# Patient Record
Sex: Male | Born: 1955
Health system: Southern US, Community
[De-identification: ages and names within clinical notes are randomized; demographics above are authoritative.]

## PROBLEM LIST (undated history)

## (undated) DIAGNOSIS — I739 Peripheral vascular disease, unspecified: Secondary | ICD-10-CM

## (undated) DIAGNOSIS — E785 Hyperlipidemia, unspecified: Secondary | ICD-10-CM

## (undated) DIAGNOSIS — M2550 Pain in unspecified joint: Secondary | ICD-10-CM

## (undated) DIAGNOSIS — I2510 Atherosclerotic heart disease of native coronary artery without angina pectoris: Secondary | ICD-10-CM

## (undated) DIAGNOSIS — R002 Palpitations: Secondary | ICD-10-CM

## (undated) DIAGNOSIS — I701 Atherosclerosis of renal artery: Secondary | ICD-10-CM

## (undated) DIAGNOSIS — E739 Lactose intolerance, unspecified: Secondary | ICD-10-CM

## (undated) DIAGNOSIS — F4323 Adjustment disorder with mixed anxiety and depressed mood: Secondary | ICD-10-CM

## (undated) DIAGNOSIS — I779 Disorder of arteries and arterioles, unspecified: Secondary | ICD-10-CM

## (undated) DIAGNOSIS — I252 Old myocardial infarction: Secondary | ICD-10-CM

## (undated) DIAGNOSIS — I10 Essential (primary) hypertension: Secondary | ICD-10-CM

## (undated) DIAGNOSIS — G4730 Sleep apnea, unspecified: Secondary | ICD-10-CM

## (undated) DIAGNOSIS — I499 Cardiac arrhythmia, unspecified: Secondary | ICD-10-CM

## (undated) DIAGNOSIS — R0602 Shortness of breath: Secondary | ICD-10-CM

## (undated) DIAGNOSIS — M549 Dorsalgia, unspecified: Secondary | ICD-10-CM

## (undated) DIAGNOSIS — R079 Chest pain, unspecified: Secondary | ICD-10-CM

## (undated) DIAGNOSIS — F419 Anxiety disorder, unspecified: Secondary | ICD-10-CM

## (undated) DIAGNOSIS — I209 Angina pectoris, unspecified: Secondary | ICD-10-CM

## (undated) DIAGNOSIS — M25551 Pain in right hip: Secondary | ICD-10-CM

## (undated) HISTORY — DX: Essential (primary) hypertension: I10

## (undated) HISTORY — DX: Sleep apnea, unspecified: G47.30

## (undated) HISTORY — DX: Shortness of breath: R06.02

## (undated) HISTORY — DX: Peripheral vascular disease, unspecified: I73.9

## (undated) HISTORY — DX: Disorder of arteries and arterioles, unspecified: I77.9

## (undated) HISTORY — DX: Pain in right hip: M25.551

## (undated) HISTORY — PX: ANTERIOR CRUCIATE LIGAMENT REPAIR: SHX115

## (undated) HISTORY — DX: Lactose intolerance, unspecified: E73.9

## (undated) HISTORY — DX: Dorsalgia, unspecified: M54.9

## (undated) HISTORY — DX: Old myocardial infarction: I25.2

## (undated) HISTORY — DX: Palpitations: R00.2

## (undated) HISTORY — DX: Atherosclerosis of renal artery: I70.1

## (undated) HISTORY — DX: Pain in unspecified joint: M25.50

## (undated) HISTORY — PX: HIP ARTHROSCOPY W/ LABRAL REPAIR: SHX1750

## (undated) HISTORY — DX: Chest pain, unspecified: R07.9

---

## 1997-12-18 ENCOUNTER — Inpatient Hospital Stay (HOSPITAL_COMMUNITY): Admission: EM | Admit: 1997-12-18 | Discharge: 1997-12-24 | Payer: Self-pay | Admitting: Emergency Medicine

## 1998-01-12 ENCOUNTER — Encounter (HOSPITAL_COMMUNITY): Admission: RE | Admit: 1998-01-12 | Discharge: 1998-04-12 | Payer: Self-pay | Admitting: Cardiovascular Disease

## 1998-01-21 ENCOUNTER — Ambulatory Visit (HOSPITAL_COMMUNITY): Admission: RE | Admit: 1998-01-21 | Discharge: 1998-01-22 | Payer: Self-pay | Admitting: Cardiovascular Disease

## 1998-04-26 ENCOUNTER — Observation Stay (HOSPITAL_COMMUNITY): Admission: RE | Admit: 1998-04-26 | Discharge: 1998-04-27 | Payer: Self-pay | Admitting: Cardiovascular Disease

## 1998-06-16 ENCOUNTER — Observation Stay (HOSPITAL_COMMUNITY): Admission: RE | Admit: 1998-06-16 | Discharge: 1998-06-17 | Payer: Self-pay | Admitting: Cardiovascular Disease

## 1998-10-02 ENCOUNTER — Inpatient Hospital Stay (HOSPITAL_COMMUNITY): Admission: EM | Admit: 1998-10-02 | Discharge: 1998-10-05 | Payer: Self-pay | Admitting: *Deleted

## 1998-10-02 ENCOUNTER — Encounter: Payer: Self-pay | Admitting: *Deleted

## 1999-07-06 ENCOUNTER — Ambulatory Visit (HOSPITAL_COMMUNITY): Admission: RE | Admit: 1999-07-06 | Discharge: 1999-07-07 | Payer: Self-pay | Admitting: Cardiovascular Disease

## 1999-07-06 ENCOUNTER — Encounter: Payer: Self-pay | Admitting: Cardiovascular Disease

## 1999-08-01 ENCOUNTER — Observation Stay (HOSPITAL_COMMUNITY): Admission: RE | Admit: 1999-08-01 | Discharge: 1999-08-02 | Payer: Self-pay | Admitting: Cardiovascular Disease

## 2000-02-17 ENCOUNTER — Inpatient Hospital Stay (HOSPITAL_COMMUNITY): Admission: AD | Admit: 2000-02-17 | Discharge: 2000-02-22 | Payer: Self-pay | Admitting: Cardiovascular Disease

## 2000-02-18 ENCOUNTER — Encounter: Payer: Self-pay | Admitting: Cardiovascular Disease

## 2000-02-21 HISTORY — PX: CORONARY ANGIOPLASTY: SHX604

## 2000-07-09 ENCOUNTER — Encounter: Payer: Self-pay | Admitting: Family Medicine

## 2000-07-09 ENCOUNTER — Encounter: Admission: RE | Admit: 2000-07-09 | Discharge: 2000-07-09 | Payer: Self-pay | Admitting: Family Medicine

## 2001-02-11 ENCOUNTER — Emergency Department (HOSPITAL_COMMUNITY): Admission: EM | Admit: 2001-02-11 | Discharge: 2001-02-11 | Payer: Self-pay | Admitting: Emergency Medicine

## 2001-02-11 ENCOUNTER — Encounter: Payer: Self-pay | Admitting: Emergency Medicine

## 2004-03-25 ENCOUNTER — Emergency Department (HOSPITAL_COMMUNITY): Admission: EM | Admit: 2004-03-25 | Discharge: 2004-03-26 | Payer: Self-pay | Admitting: Emergency Medicine

## 2004-09-08 ENCOUNTER — Encounter: Admission: RE | Admit: 2004-09-08 | Discharge: 2004-09-08 | Payer: Self-pay | Admitting: Family Medicine

## 2005-01-13 ENCOUNTER — Ambulatory Visit (HOSPITAL_COMMUNITY): Admission: RE | Admit: 2005-01-13 | Discharge: 2005-01-13 | Payer: Self-pay | Admitting: Otolaryngology

## 2005-01-26 ENCOUNTER — Encounter (INDEPENDENT_AMBULATORY_CARE_PROVIDER_SITE_OTHER): Payer: Self-pay | Admitting: Specialist

## 2005-01-26 ENCOUNTER — Ambulatory Visit (HOSPITAL_COMMUNITY): Admission: RE | Admit: 2005-01-26 | Discharge: 2005-01-27 | Payer: Self-pay | Admitting: Otolaryngology

## 2005-02-02 ENCOUNTER — Inpatient Hospital Stay (HOSPITAL_COMMUNITY): Admission: EM | Admit: 2005-02-02 | Discharge: 2005-02-06 | Payer: Self-pay | Admitting: Emergency Medicine

## 2005-11-09 HISTORY — PX: OTHER SURGICAL HISTORY: SHX169

## 2006-05-03 ENCOUNTER — Inpatient Hospital Stay (HOSPITAL_COMMUNITY): Admission: RE | Admit: 2006-05-03 | Discharge: 2006-05-04 | Payer: Self-pay | Admitting: Vascular Surgery

## 2006-05-03 ENCOUNTER — Encounter (INDEPENDENT_AMBULATORY_CARE_PROVIDER_SITE_OTHER): Payer: Self-pay | Admitting: *Deleted

## 2006-05-03 HISTORY — PX: CAROTID ENDARTERECTOMY: SUR193

## 2006-11-19 ENCOUNTER — Ambulatory Visit: Payer: Self-pay | Admitting: Vascular Surgery

## 2009-12-10 DIAGNOSIS — I2510 Atherosclerotic heart disease of native coronary artery without angina pectoris: Secondary | ICD-10-CM

## 2009-12-10 HISTORY — DX: Atherosclerotic heart disease of native coronary artery without angina pectoris: I25.10

## 2009-12-14 ENCOUNTER — Inpatient Hospital Stay (HOSPITAL_COMMUNITY): Admission: AD | Admit: 2009-12-14 | Discharge: 2009-12-27 | Payer: Self-pay | Admitting: Cardiovascular Disease

## 2009-12-14 ENCOUNTER — Ambulatory Visit: Payer: Self-pay | Admitting: Thoracic Surgery (Cardiothoracic Vascular Surgery)

## 2009-12-14 HISTORY — PX: CARDIAC CATHETERIZATION: SHX172

## 2009-12-15 ENCOUNTER — Encounter (INDEPENDENT_AMBULATORY_CARE_PROVIDER_SITE_OTHER): Payer: Self-pay | Admitting: Cardiovascular Disease

## 2009-12-20 HISTORY — PX: CORONARY ARTERY BYPASS GRAFT: SHX141

## 2009-12-30 ENCOUNTER — Ambulatory Visit: Payer: Self-pay | Admitting: Thoracic Surgery (Cardiothoracic Vascular Surgery)

## 2010-01-13 ENCOUNTER — Encounter
Admission: RE | Admit: 2010-01-13 | Discharge: 2010-01-13 | Payer: Self-pay | Admitting: Thoracic Surgery (Cardiothoracic Vascular Surgery)

## 2010-01-13 ENCOUNTER — Ambulatory Visit: Payer: Self-pay | Admitting: Thoracic Surgery (Cardiothoracic Vascular Surgery)

## 2010-01-20 ENCOUNTER — Encounter (HOSPITAL_COMMUNITY): Admission: RE | Admit: 2010-01-20 | Discharge: 2010-04-20 | Payer: Self-pay | Admitting: Cardiovascular Disease

## 2010-01-20 ENCOUNTER — Ambulatory Visit: Payer: Self-pay | Admitting: Thoracic Surgery (Cardiothoracic Vascular Surgery)

## 2010-04-21 ENCOUNTER — Encounter (HOSPITAL_COMMUNITY): Admission: RE | Admit: 2010-04-21 | Discharge: 2010-05-20 | Payer: Self-pay | Admitting: Cardiovascular Disease

## 2010-09-17 ENCOUNTER — Emergency Department (HOSPITAL_COMMUNITY)
Admission: EM | Admit: 2010-09-17 | Discharge: 2010-09-17 | Payer: Self-pay | Source: Home / Self Care | Admitting: Emergency Medicine

## 2010-09-26 LAB — COMPREHENSIVE METABOLIC PANEL
ALT: 36 U/L (ref 0–53)
AST: 32 U/L (ref 0–37)
Albumin: 4.2 g/dL (ref 3.5–5.2)
Alkaline Phosphatase: 61 U/L (ref 39–117)
BUN: 17 mg/dL (ref 6–23)
CO2: 23 mEq/L (ref 19–32)
Calcium: 9.4 mg/dL (ref 8.4–10.5)
Chloride: 106 mEq/L (ref 96–112)
Creatinine, Ser: 1.15 mg/dL (ref 0.4–1.5)
GFR calc Af Amer: >60 mL/min (ref >60)
GFR calc non Af Amer: >60 mL/min (ref >60)
Glucose, Bld: 146 mg/dL — ABNORMAL HIGH (ref 70–99)
Potassium: 4.2 mEq/L (ref 3.5–5.1)
Sodium: 141 mEq/L (ref 135–145)
Total Bilirubin: 1.1 mg/dL (ref 0.3–1.2)
Total Protein: 7.5 g/dL (ref 6.0–8.3)

## 2010-09-26 LAB — CBC
HCT: 48 % (ref 39.0–52.0)
Hemoglobin: 16.8 g/dL (ref 13.0–17.0)
MCH: 32.7 pg (ref 26.0–34.0)
MCHC: 35 g/dL (ref 30.0–36.0)
MCV: 93.4 fL (ref 78.0–100.0)
Platelets: 195 10*3/uL (ref 150–400)
RBC: 5.14 MIL/uL (ref 4.22–5.81)
RDW: 12.6 % (ref 11.5–15.5)
WBC: 14.5 10*3/uL — ABNORMAL HIGH (ref 4.0–10.5)

## 2010-09-26 LAB — DIFFERENTIAL
Basophils Absolute: 0 10*3/uL (ref 0.0–0.1)
Basophils Relative: 0 % (ref 0–1)
Eosinophils Absolute: 0 10*3/uL (ref 0.0–0.7)
Eosinophils Relative: 0 % (ref 0–5)
Lymphocytes Relative: 2 % — ABNORMAL LOW (ref 12–46)
Lymphs Abs: 0.3 10*3/uL — ABNORMAL LOW (ref 0.7–4.0)
Monocytes Absolute: 0.7 10*3/uL (ref 0.1–1.0)
Monocytes Relative: 5 % (ref 3–12)
Neutro Abs: 13.5 10*3/uL — ABNORMAL HIGH (ref 1.7–7.7)
Neutrophils Relative %: 93 % — ABNORMAL HIGH (ref 43–77)

## 2010-09-26 LAB — POCT CARDIAC MARKERS
CKMB, poc: <1 ng/mL — ABNORMAL LOW (ref 1.0–8.0)
Myoglobin, poc: 46.2 ng/mL (ref 12–200)
Troponin i, poc: <0.05 ng/mL (ref 0.00–0.09)

## 2010-09-26 LAB — LIPASE, BLOOD: Lipase: 16 U/L (ref 11–59)

## 2010-10-02 ENCOUNTER — Encounter: Payer: Self-pay | Admitting: Otolaryngology

## 2010-11-29 LAB — BASIC METABOLIC PANEL
BUN: 12 mg/dL (ref 6–23)
BUN: 13 mg/dL (ref 6–23)
BUN: 9 mg/dL (ref 6–23)
CO2: 29 mEq/L (ref 19–32)
CO2: 32 mEq/L (ref 19–32)
CO2: 32 mEq/L (ref 19–32)
Calcium: 8.1 mg/dL — ABNORMAL LOW (ref 8.4–10.5)
Calcium: 8.3 mg/dL — ABNORMAL LOW (ref 8.4–10.5)
Calcium: 8.4 mg/dL (ref 8.4–10.5)
Chloride: 101 mEq/L (ref 96–112)
Chloride: 96 mEq/L (ref 96–112)
Chloride: 97 mEq/L (ref 96–112)
Creatinine, Ser: 0.96 mg/dL (ref 0.4–1.5)
Creatinine, Ser: 0.99 mg/dL (ref 0.4–1.5)
Creatinine, Ser: 1 mg/dL (ref 0.4–1.5)
GFR calc Af Amer: >60 mL/min (ref >60)
GFR calc Af Amer: >60 mL/min (ref >60)
GFR calc Af Amer: >60 mL/min (ref >60)
GFR calc non Af Amer: >60 mL/min (ref >60)
GFR calc non Af Amer: >60 mL/min (ref >60)
GFR calc non Af Amer: >60 mL/min (ref >60)
Glucose, Bld: 104 mg/dL — ABNORMAL HIGH (ref 70–99)
Glucose, Bld: 119 mg/dL — ABNORMAL HIGH (ref 70–99)
Glucose, Bld: 94 mg/dL (ref 70–99)
Potassium: 3.6 mEq/L (ref 3.5–5.1)
Potassium: 3.6 mEq/L (ref 3.5–5.1)
Potassium: 3.9 mEq/L (ref 3.5–5.1)
Sodium: 136 mEq/L (ref 135–145)
Sodium: 138 mEq/L (ref 135–145)
Sodium: 140 mEq/L (ref 135–145)

## 2010-11-29 LAB — CBC
HCT: 28.5 % — ABNORMAL LOW (ref 39.0–52.0)
HCT: 30.4 % — ABNORMAL LOW (ref 39.0–52.0)
Hemoglobin: 10.1 g/dL — ABNORMAL LOW (ref 13.0–17.0)
Hemoglobin: 10.5 g/dL — ABNORMAL LOW (ref 13.0–17.0)
MCHC: 34.6 g/dL (ref 30.0–36.0)
MCHC: 35.4 g/dL (ref 30.0–36.0)
MCV: 96 fL (ref 78.0–100.0)
MCV: 96.2 fL (ref 78.0–100.0)
Platelets: 169 10*3/uL (ref 150–400)
Platelets: 207 10*3/uL (ref 150–400)
RBC: 2.96 MIL/uL — ABNORMAL LOW (ref 4.22–5.81)
RBC: 3.16 MIL/uL — ABNORMAL LOW (ref 4.22–5.81)
RDW: 13.4 % (ref 11.5–15.5)
RDW: 13.6 % (ref 11.5–15.5)
WBC: 11.1 10*3/uL — ABNORMAL HIGH (ref 4.0–10.5)
WBC: 13.3 10*3/uL — ABNORMAL HIGH (ref 4.0–10.5)

## 2010-11-29 LAB — GLUCOSE, CAPILLARY
Glucose-Capillary: 102 mg/dL — ABNORMAL HIGH (ref 70–99)
Glucose-Capillary: 111 mg/dL — ABNORMAL HIGH (ref 70–99)
Glucose-Capillary: 115 mg/dL — ABNORMAL HIGH (ref 70–99)
Glucose-Capillary: 117 mg/dL — ABNORMAL HIGH (ref 70–99)
Glucose-Capillary: 117 mg/dL — ABNORMAL HIGH (ref 70–99)
Glucose-Capillary: 125 mg/dL — ABNORMAL HIGH (ref 70–99)
Glucose-Capillary: 125 mg/dL — ABNORMAL HIGH (ref 70–99)
Glucose-Capillary: 132 mg/dL — ABNORMAL HIGH (ref 70–99)
Glucose-Capillary: 132 mg/dL — ABNORMAL HIGH (ref 70–99)
Glucose-Capillary: 136 mg/dL — ABNORMAL HIGH (ref 70–99)
Glucose-Capillary: 137 mg/dL — ABNORMAL HIGH (ref 70–99)
Glucose-Capillary: 145 mg/dL — ABNORMAL HIGH (ref 70–99)
Glucose-Capillary: 86 mg/dL (ref 70–99)

## 2010-11-30 LAB — CARDIAC PANEL(CRET KIN+CKTOT+MB+TROPI)
CK, MB: 0.7 ng/mL (ref 0.3–4.0)
CK, MB: 1.6 ng/mL (ref 0.3–4.0)
Relative Index: 0.7 (ref 0.0–2.5)
Relative Index: INVALID (ref 0.0–2.5)
Total CK: 234 U/L — ABNORMAL HIGH (ref 7–232)
Total CK: 70 U/L (ref 7–232)
Troponin I: <0.01 ng/mL (ref 0.00–0.06)
Troponin I: <0.01 ng/mL (ref 0.00–0.06)

## 2010-11-30 LAB — BASIC METABOLIC PANEL
BUN: 10 mg/dL (ref 6–23)
BUN: 10 mg/dL (ref 6–23)
BUN: 12 mg/dL (ref 6–23)
BUN: 13 mg/dL (ref 6–23)
BUN: 14 mg/dL (ref 6–23)
BUN: 9 mg/dL (ref 6–23)
CO2: 24 mEq/L (ref 19–32)
CO2: 26 mEq/L (ref 19–32)
CO2: 27 mEq/L (ref 19–32)
CO2: 30 mEq/L (ref 19–32)
CO2: 30 mEq/L (ref 19–32)
CO2: 33 mEq/L — ABNORMAL HIGH (ref 19–32)
Calcium: 7.5 mg/dL — ABNORMAL LOW (ref 8.4–10.5)
Calcium: 7.8 mg/dL — ABNORMAL LOW (ref 8.4–10.5)
Calcium: 8.2 mg/dL — ABNORMAL LOW (ref 8.4–10.5)
Calcium: 8.4 mg/dL (ref 8.4–10.5)
Calcium: 9 mg/dL (ref 8.4–10.5)
Calcium: 9.5 mg/dL (ref 8.4–10.5)
Chloride: 101 mEq/L (ref 96–112)
Chloride: 104 mEq/L (ref 96–112)
Chloride: 106 mEq/L (ref 96–112)
Chloride: 108 mEq/L (ref 96–112)
Chloride: 96 mEq/L (ref 96–112)
Chloride: 98 mEq/L (ref 96–112)
Creatinine, Ser: 0.84 mg/dL (ref 0.4–1.5)
Creatinine, Ser: 0.95 mg/dL (ref 0.4–1.5)
Creatinine, Ser: 0.95 mg/dL (ref 0.4–1.5)
Creatinine, Ser: 0.99 mg/dL (ref 0.4–1.5)
Creatinine, Ser: 1.01 mg/dL (ref 0.4–1.5)
Creatinine, Ser: 1.09 mg/dL (ref 0.4–1.5)
GFR calc Af Amer: >60 mL/min (ref >60)
GFR calc Af Amer: >60 mL/min (ref >60)
GFR calc Af Amer: >60 mL/min (ref >60)
GFR calc Af Amer: >60 mL/min (ref >60)
GFR calc Af Amer: >60 mL/min (ref >60)
GFR calc Af Amer: >60 mL/min (ref >60)
GFR calc non Af Amer: >60 mL/min (ref >60)
GFR calc non Af Amer: >60 mL/min (ref >60)
GFR calc non Af Amer: >60 mL/min (ref >60)
GFR calc non Af Amer: >60 mL/min (ref >60)
GFR calc non Af Amer: >60 mL/min (ref >60)
GFR calc non Af Amer: >60 mL/min (ref >60)
Glucose, Bld: 100 mg/dL — ABNORMAL HIGH (ref 70–99)
Glucose, Bld: 107 mg/dL — ABNORMAL HIGH (ref 70–99)
Glucose, Bld: 109 mg/dL — ABNORMAL HIGH (ref 70–99)
Glucose, Bld: 145 mg/dL — ABNORMAL HIGH (ref 70–99)
Glucose, Bld: 168 mg/dL — ABNORMAL HIGH (ref 70–99)
Glucose, Bld: 99 mg/dL (ref 70–99)
Potassium: 3.9 mEq/L (ref 3.5–5.1)
Potassium: 3.9 mEq/L (ref 3.5–5.1)
Potassium: 4.3 mEq/L (ref 3.5–5.1)
Potassium: 4.5 mEq/L (ref 3.5–5.1)
Potassium: 4.5 mEq/L (ref 3.5–5.1)
Potassium: 4.5 mEq/L (ref 3.5–5.1)
Sodium: 134 mEq/L — ABNORMAL LOW (ref 135–145)
Sodium: 135 mEq/L (ref 135–145)
Sodium: 135 mEq/L (ref 135–145)
Sodium: 138 mEq/L (ref 135–145)
Sodium: 139 mEq/L (ref 135–145)
Sodium: 139 mEq/L (ref 135–145)

## 2010-11-30 LAB — CBC
HCT: 28 % — ABNORMAL LOW (ref 39.0–52.0)
HCT: 29 % — ABNORMAL LOW (ref 39.0–52.0)
HCT: 31.1 % — ABNORMAL LOW (ref 39.0–52.0)
HCT: 32 % — ABNORMAL LOW (ref 39.0–52.0)
HCT: 32.5 % — ABNORMAL LOW (ref 39.0–52.0)
HCT: 32.8 % — ABNORMAL LOW (ref 39.0–52.0)
HCT: 38 % — ABNORMAL LOW (ref 39.0–52.0)
HCT: 38.4 % — ABNORMAL LOW (ref 39.0–52.0)
HCT: 38.8 % — ABNORMAL LOW (ref 39.0–52.0)
HCT: 40.7 % (ref 39.0–52.0)
HCT: 42.3 % (ref 39.0–52.0)
HCT: 44 % (ref 39.0–52.0)
Hemoglobin: 10.1 g/dL — ABNORMAL LOW (ref 13.0–17.0)
Hemoglobin: 10.6 g/dL — ABNORMAL LOW (ref 13.0–17.0)
Hemoglobin: 10.9 g/dL — ABNORMAL LOW (ref 13.0–17.0)
Hemoglobin: 11.1 g/dL — ABNORMAL LOW (ref 13.0–17.0)
Hemoglobin: 11.3 g/dL — ABNORMAL LOW (ref 13.0–17.0)
Hemoglobin: 13 g/dL (ref 13.0–17.0)
Hemoglobin: 13.1 g/dL (ref 13.0–17.0)
Hemoglobin: 13.2 g/dL (ref 13.0–17.0)
Hemoglobin: 13.9 g/dL (ref 13.0–17.0)
Hemoglobin: 14.4 g/dL (ref 13.0–17.0)
Hemoglobin: 15 g/dL (ref 13.0–17.0)
Hemoglobin: 9.7 g/dL — ABNORMAL LOW (ref 13.0–17.0)
MCHC: 33.5 g/dL (ref 30.0–36.0)
MCHC: 33.6 g/dL (ref 30.0–36.0)
MCHC: 34 g/dL (ref 30.0–36.0)
MCHC: 34.1 g/dL (ref 30.0–36.0)
MCHC: 34.1 g/dL (ref 30.0–36.0)
MCHC: 34.2 g/dL (ref 30.0–36.0)
MCHC: 34.4 g/dL (ref 30.0–36.0)
MCHC: 34.4 g/dL (ref 30.0–36.0)
MCHC: 34.5 g/dL (ref 30.0–36.0)
MCHC: 34.6 g/dL (ref 30.0–36.0)
MCHC: 34.6 g/dL (ref 30.0–36.0)
MCHC: 34.8 g/dL (ref 30.0–36.0)
MCV: 94.3 fL (ref 78.0–100.0)
MCV: 94.5 fL (ref 78.0–100.0)
MCV: 94.9 fL (ref 78.0–100.0)
MCV: 95 fL (ref 78.0–100.0)
MCV: 95.3 fL (ref 78.0–100.0)
MCV: 95.5 fL (ref 78.0–100.0)
MCV: 95.6 fL (ref 78.0–100.0)
MCV: 95.6 fL (ref 78.0–100.0)
MCV: 95.6 fL (ref 78.0–100.0)
MCV: 95.7 fL (ref 78.0–100.0)
MCV: 95.9 fL (ref 78.0–100.0)
MCV: 96.3 fL (ref 78.0–100.0)
Platelets: 104 10*3/uL — ABNORMAL LOW (ref 150–400)
Platelets: 120 10*3/uL — ABNORMAL LOW (ref 150–400)
Platelets: 122 10*3/uL — ABNORMAL LOW (ref 150–400)
Platelets: 124 10*3/uL — ABNORMAL LOW (ref 150–400)
Platelets: 137 10*3/uL — ABNORMAL LOW (ref 150–400)
Platelets: 147 10*3/uL — ABNORMAL LOW (ref 150–400)
Platelets: 164 10*3/uL (ref 150–400)
Platelets: 166 10*3/uL (ref 150–400)
Platelets: 170 10*3/uL (ref 150–400)
Platelets: 183 10*3/uL (ref 150–400)
Platelets: 190 10*3/uL (ref 150–400)
Platelets: 197 10*3/uL (ref 150–400)
RBC: 2.91 MIL/uL — ABNORMAL LOW (ref 4.22–5.81)
RBC: 3.03 MIL/uL — ABNORMAL LOW (ref 4.22–5.81)
RBC: 3.24 MIL/uL — ABNORMAL LOW (ref 4.22–5.81)
RBC: 3.37 MIL/uL — ABNORMAL LOW (ref 4.22–5.81)
RBC: 3.4 MIL/uL — ABNORMAL LOW (ref 4.22–5.81)
RBC: 3.44 MIL/uL — ABNORMAL LOW (ref 4.22–5.81)
RBC: 3.97 MIL/uL — ABNORMAL LOW (ref 4.22–5.81)
RBC: 4.03 MIL/uL — ABNORMAL LOW (ref 4.22–5.81)
RBC: 4.05 MIL/uL — ABNORMAL LOW (ref 4.22–5.81)
RBC: 4.31 MIL/uL (ref 4.22–5.81)
RBC: 4.46 MIL/uL (ref 4.22–5.81)
RBC: 4.66 MIL/uL (ref 4.22–5.81)
RDW: 12.9 % (ref 11.5–15.5)
RDW: 13 % (ref 11.5–15.5)
RDW: 13 % (ref 11.5–15.5)
RDW: 13.1 % (ref 11.5–15.5)
RDW: 13.1 % (ref 11.5–15.5)
RDW: 13.2 % (ref 11.5–15.5)
RDW: 13.2 % (ref 11.5–15.5)
RDW: 13.2 % (ref 11.5–15.5)
RDW: 13.3 % (ref 11.5–15.5)
RDW: 13.5 % (ref 11.5–15.5)
RDW: 13.5 % (ref 11.5–15.5)
RDW: 13.8 % (ref 11.5–15.5)
WBC: 12.1 10*3/uL — ABNORMAL HIGH (ref 4.0–10.5)
WBC: 13.8 10*3/uL — ABNORMAL HIGH (ref 4.0–10.5)
WBC: 16.2 10*3/uL — ABNORMAL HIGH (ref 4.0–10.5)
WBC: 19.1 10*3/uL — ABNORMAL HIGH (ref 4.0–10.5)
WBC: 19.5 10*3/uL — ABNORMAL HIGH (ref 4.0–10.5)
WBC: 20.2 10*3/uL — ABNORMAL HIGH (ref 4.0–10.5)
WBC: 5.3 10*3/uL (ref 4.0–10.5)
WBC: 6.1 10*3/uL (ref 4.0–10.5)
WBC: 6.4 10*3/uL (ref 4.0–10.5)
WBC: 6.9 10*3/uL (ref 4.0–10.5)
WBC: 6.9 10*3/uL (ref 4.0–10.5)
WBC: 7.5 10*3/uL (ref 4.0–10.5)

## 2010-11-30 LAB — MAGNESIUM
Magnesium: 2.3 mg/dL (ref 1.5–2.5)
Magnesium: 2.4 mg/dL (ref 1.5–2.5)
Magnesium: 2.8 mg/dL — ABNORMAL HIGH (ref 1.5–2.5)

## 2010-11-30 LAB — GLUCOSE, CAPILLARY
Glucose-Capillary: 109 mg/dL — ABNORMAL HIGH (ref 70–99)
Glucose-Capillary: 117 mg/dL — ABNORMAL HIGH (ref 70–99)
Glucose-Capillary: 119 mg/dL — ABNORMAL HIGH (ref 70–99)
Glucose-Capillary: 123 mg/dL — ABNORMAL HIGH (ref 70–99)
Glucose-Capillary: 126 mg/dL — ABNORMAL HIGH (ref 70–99)
Glucose-Capillary: 133 mg/dL — ABNORMAL HIGH (ref 70–99)
Glucose-Capillary: 135 mg/dL — ABNORMAL HIGH (ref 70–99)
Glucose-Capillary: 140 mg/dL — ABNORMAL HIGH (ref 70–99)
Glucose-Capillary: 140 mg/dL — ABNORMAL HIGH (ref 70–99)
Glucose-Capillary: 144 mg/dL — ABNORMAL HIGH (ref 70–99)
Glucose-Capillary: 146 mg/dL — ABNORMAL HIGH (ref 70–99)
Glucose-Capillary: 146 mg/dL — ABNORMAL HIGH (ref 70–99)
Glucose-Capillary: 151 mg/dL — ABNORMAL HIGH (ref 70–99)
Glucose-Capillary: 152 mg/dL — ABNORMAL HIGH (ref 70–99)
Glucose-Capillary: 154 mg/dL — ABNORMAL HIGH (ref 70–99)
Glucose-Capillary: 154 mg/dL — ABNORMAL HIGH (ref 70–99)
Glucose-Capillary: 157 mg/dL — ABNORMAL HIGH (ref 70–99)
Glucose-Capillary: 160 mg/dL — ABNORMAL HIGH (ref 70–99)
Glucose-Capillary: 163 mg/dL — ABNORMAL HIGH (ref 70–99)
Glucose-Capillary: 168 mg/dL — ABNORMAL HIGH (ref 70–99)
Glucose-Capillary: 168 mg/dL — ABNORMAL HIGH (ref 70–99)
Glucose-Capillary: 183 mg/dL — ABNORMAL HIGH (ref 70–99)
Glucose-Capillary: 188 mg/dL — ABNORMAL HIGH (ref 70–99)
Glucose-Capillary: 91 mg/dL (ref 70–99)
Glucose-Capillary: 93 mg/dL (ref 70–99)

## 2010-11-30 LAB — BLOOD GAS, ARTERIAL
Acid-Base Excess: 1.4 mmol/L (ref 0.0–2.0)
Bicarbonate: 26 mEq/L — ABNORMAL HIGH (ref 20.0–24.0)
FIO2: 0.21 %
O2 Saturation: 95.1 %
Patient temperature: 98.6
TCO2: 27.4 mmol/L (ref 0–100)
pCO2 arterial: 45.1 mmHg — ABNORMAL HIGH (ref 35.0–45.0)
pH, Arterial: 7.379 (ref 7.350–7.450)
pO2, Arterial: 76.6 mmHg — ABNORMAL LOW (ref 80.0–100.0)

## 2010-11-30 LAB — TROPONIN I: Troponin I: <0.01 ng/mL (ref 0.00–0.06)

## 2010-11-30 LAB — HEPARIN LEVEL (UNFRACTIONATED)
Heparin Unfractionated: 0.19 IU/mL — ABNORMAL LOW (ref 0.30–0.70)
Heparin Unfractionated: 0.27 IU/mL — ABNORMAL LOW (ref 0.30–0.70)
Heparin Unfractionated: 0.31 IU/mL (ref 0.30–0.70)
Heparin Unfractionated: 0.36 IU/mL (ref 0.30–0.70)
Heparin Unfractionated: 0.4 IU/mL (ref 0.30–0.70)
Heparin Unfractionated: 0.51 IU/mL (ref 0.30–0.70)
Heparin Unfractionated: 0.62 IU/mL (ref 0.30–0.70)

## 2010-11-30 LAB — APTT
aPTT: 32 seconds (ref 24–37)
aPTT: 34 seconds (ref 24–37)

## 2010-11-30 LAB — URINALYSIS, ROUTINE W REFLEX MICROSCOPIC
Bilirubin Urine: NEGATIVE
Glucose, UA: NEGATIVE mg/dL
Hgb urine dipstick: NEGATIVE
Ketones, ur: NEGATIVE mg/dL
Nitrite: NEGATIVE
Protein, ur: NEGATIVE mg/dL
Specific Gravity, Urine: 1.012 (ref 1.005–1.030)
Urobilinogen, UA: 0.2 mg/dL (ref 0.0–1.0)
pH: 6.5 (ref 5.0–8.0)

## 2010-11-30 LAB — CREATININE, SERUM
Creatinine, Ser: 0.98 mg/dL (ref 0.4–1.5)
Creatinine, Ser: 1.18 mg/dL (ref 0.4–1.5)
GFR calc Af Amer: >60 mL/min (ref >60)
GFR calc Af Amer: >60 mL/min (ref >60)
GFR calc non Af Amer: >60 mL/min (ref >60)
GFR calc non Af Amer: >60 mL/min (ref >60)

## 2010-11-30 LAB — CROSSMATCH
ABO/RH(D): O POS
Antibody Screen: NEGATIVE

## 2010-11-30 LAB — POCT I-STAT 3, ART BLOOD GAS (G3+)
Acid-Base Excess: 1 mmol/L (ref 0.0–2.0)
Acid-base deficit: 1 mmol/L (ref 0.0–2.0)
Acid-base deficit: 4 mmol/L — ABNORMAL HIGH (ref 0.0–2.0)
Bicarbonate: 21.8 mEq/L (ref 20.0–24.0)
Bicarbonate: 23.6 mEq/L (ref 20.0–24.0)
Bicarbonate: 25.3 mEq/L — ABNORMAL HIGH (ref 20.0–24.0)
O2 Saturation: 100 %
O2 Saturation: 96 %
O2 Saturation: 98 %
Patient temperature: 35.9
Patient temperature: 98.8
TCO2: 23 mmol/L (ref 0–100)
TCO2: 25 mmol/L (ref 0–100)
TCO2: 26 mmol/L (ref 0–100)
pCO2 arterial: 37 mmHg (ref 35.0–45.0)
pCO2 arterial: 38.6 mmHg (ref 35.0–45.0)
pCO2 arterial: 43.9 mmHg (ref 35.0–45.0)
pH, Arterial: 7.305 — ABNORMAL LOW (ref 7.350–7.450)
pH, Arterial: 7.409 (ref 7.350–7.450)
pH, Arterial: 7.425 (ref 7.350–7.450)
pO2, Arterial: 107 mmHg — ABNORMAL HIGH (ref 80.0–100.0)
pO2, Arterial: 371 mmHg — ABNORMAL HIGH (ref 80.0–100.0)
pO2, Arterial: 88 mmHg (ref 80.0–100.0)

## 2010-11-30 LAB — POCT I-STAT, CHEM 8
BUN: 10 mg/dL (ref 6–23)
BUN: 14 mg/dL (ref 6–23)
BUN: 9 mg/dL (ref 6–23)
Calcium, Ion: 1.05 mmol/L — ABNORMAL LOW (ref 1.12–1.32)
Calcium, Ion: 1.05 mmol/L — ABNORMAL LOW (ref 1.12–1.32)
Calcium, Ion: 1.15 mmol/L (ref 1.12–1.32)
Chloride: 101 mEq/L (ref 96–112)
Chloride: 104 mEq/L (ref 96–112)
Chloride: 104 mEq/L (ref 96–112)
Creatinine, Ser: 0.8 mg/dL (ref 0.4–1.5)
Creatinine, Ser: 0.9 mg/dL (ref 0.4–1.5)
Creatinine, Ser: 0.9 mg/dL (ref 0.4–1.5)
Glucose, Bld: 168 mg/dL — ABNORMAL HIGH (ref 70–99)
Glucose, Bld: 168 mg/dL — ABNORMAL HIGH (ref 70–99)
Glucose, Bld: 171 mg/dL — ABNORMAL HIGH (ref 70–99)
HCT: 34 % — ABNORMAL LOW (ref 39.0–52.0)
HCT: 34 % — ABNORMAL LOW (ref 39.0–52.0)
HCT: 35 % — ABNORMAL LOW (ref 39.0–52.0)
Hemoglobin: 11.6 g/dL — ABNORMAL LOW (ref 13.0–17.0)
Hemoglobin: 11.6 g/dL — ABNORMAL LOW (ref 13.0–17.0)
Hemoglobin: 11.9 g/dL — ABNORMAL LOW (ref 13.0–17.0)
Potassium: 4.9 mEq/L (ref 3.5–5.1)
Potassium: 6.3 mEq/L (ref 3.5–5.1)
Potassium: 6.3 mEq/L (ref 3.5–5.1)
Sodium: 137 mEq/L (ref 135–145)
Sodium: 138 mEq/L (ref 135–145)
Sodium: 138 mEq/L (ref 135–145)
TCO2: 25 mmol/L (ref 0–100)
TCO2: 25 mmol/L (ref 0–100)
TCO2: 25 mmol/L (ref 0–100)

## 2010-11-30 LAB — POCT I-STAT 4, (NA,K, GLUC, HGB,HCT)
Glucose, Bld: 107 mg/dL — ABNORMAL HIGH (ref 70–99)
Glucose, Bld: 109 mg/dL — ABNORMAL HIGH (ref 70–99)
Glucose, Bld: 115 mg/dL — ABNORMAL HIGH (ref 70–99)
Glucose, Bld: 129 mg/dL — ABNORMAL HIGH (ref 70–99)
Glucose, Bld: 138 mg/dL — ABNORMAL HIGH (ref 70–99)
Glucose, Bld: 93 mg/dL (ref 70–99)
HCT: 26 % — ABNORMAL LOW (ref 39.0–52.0)
HCT: 26 % — ABNORMAL LOW (ref 39.0–52.0)
HCT: 31 % — ABNORMAL LOW (ref 39.0–52.0)
HCT: 33 % — ABNORMAL LOW (ref 39.0–52.0)
HCT: 38 % — ABNORMAL LOW (ref 39.0–52.0)
HCT: 38 % — ABNORMAL LOW (ref 39.0–52.0)
Hemoglobin: 10.5 g/dL — ABNORMAL LOW (ref 13.0–17.0)
Hemoglobin: 11.2 g/dL — ABNORMAL LOW (ref 13.0–17.0)
Hemoglobin: 12.9 g/dL — ABNORMAL LOW (ref 13.0–17.0)
Hemoglobin: 12.9 g/dL — ABNORMAL LOW (ref 13.0–17.0)
Hemoglobin: 8.8 g/dL — ABNORMAL LOW (ref 13.0–17.0)
Hemoglobin: 8.8 g/dL — ABNORMAL LOW (ref 13.0–17.0)
Potassium: 3.7 mEq/L (ref 3.5–5.1)
Potassium: 3.8 mEq/L (ref 3.5–5.1)
Potassium: 3.9 mEq/L (ref 3.5–5.1)
Potassium: 4 mEq/L (ref 3.5–5.1)
Potassium: 4 mEq/L (ref 3.5–5.1)
Potassium: 4.7 mEq/L (ref 3.5–5.1)
Sodium: 132 mEq/L — ABNORMAL LOW (ref 135–145)
Sodium: 135 mEq/L (ref 135–145)
Sodium: 137 mEq/L (ref 135–145)
Sodium: 138 mEq/L (ref 135–145)
Sodium: 139 mEq/L (ref 135–145)
Sodium: 139 mEq/L (ref 135–145)

## 2010-11-30 LAB — COMPREHENSIVE METABOLIC PANEL
ALT: 32 U/L (ref 0–53)
AST: 25 U/L (ref 0–37)
Albumin: 3.6 g/dL (ref 3.5–5.2)
Alkaline Phosphatase: 54 U/L (ref 39–117)
BUN: 7 mg/dL (ref 6–23)
CO2: 29 mEq/L (ref 19–32)
Calcium: 8.6 mg/dL (ref 8.4–10.5)
Chloride: 103 mEq/L (ref 96–112)
Creatinine, Ser: 0.98 mg/dL (ref 0.4–1.5)
GFR calc Af Amer: >60 mL/min (ref >60)
GFR calc non Af Amer: >60 mL/min (ref >60)
Glucose, Bld: 108 mg/dL — ABNORMAL HIGH (ref 70–99)
Potassium: 4.1 mEq/L (ref 3.5–5.1)
Sodium: 138 mEq/L (ref 135–145)
Total Bilirubin: 0.4 mg/dL (ref 0.3–1.2)
Total Protein: 6.2 g/dL (ref 6.0–8.3)

## 2010-11-30 LAB — PROTIME-INR
INR: 0.99 (ref 0.00–1.49)
INR: 1.31 (ref 0.00–1.49)
Prothrombin Time: 13 seconds (ref 11.6–15.2)
Prothrombin Time: 16.2 seconds — ABNORMAL HIGH (ref 11.6–15.2)

## 2010-11-30 LAB — POTASSIUM
Potassium: 4.5 mEq/L (ref 3.5–5.1)
Potassium: 6 mEq/L — ABNORMAL HIGH (ref 3.5–5.1)

## 2010-11-30 LAB — CK TOTAL AND CKMB (NOT AT ARMC)
CK, MB: 1 ng/mL (ref 0.3–4.0)
Relative Index: 0.7 (ref 0.0–2.5)
Total CK: 152 U/L (ref 7–232)

## 2010-11-30 LAB — HEMOGLOBIN AND HEMATOCRIT, BLOOD
HCT: 28.8 % — ABNORMAL LOW (ref 39.0–52.0)
Hemoglobin: 9.8 g/dL — ABNORMAL LOW (ref 13.0–17.0)

## 2010-11-30 LAB — MRSA PCR SCREENING: MRSA by PCR: NEGATIVE

## 2010-11-30 LAB — POCT I-STAT GLUCOSE
Glucose, Bld: 116 mg/dL — ABNORMAL HIGH (ref 70–99)
Glucose, Bld: 117 mg/dL — ABNORMAL HIGH (ref 70–99)
Operator id: 219281
Operator id: 219281

## 2010-11-30 LAB — AMYLASE: Amylase: 45 U/L (ref 0–105)

## 2010-11-30 LAB — PLATELET COUNT: Platelets: 120 10*3/uL — ABNORMAL LOW (ref 150–400)

## 2010-11-30 LAB — TSH: TSH: 1.049 u[IU]/mL (ref 0.350–4.500)

## 2011-01-24 NOTE — Assessment & Plan Note (Signed)
OFFICE VISIT   Maurice Park, HECKENDORN  DOB:  1956-05-09                                        Jan 13, 2010  CHART #:  [id number]   HISTORY:  The patient is a 55 year old gentleman who underwent coronary  bypass grafting x4 on December 20, 2009.  He had mammary to his LAD, a  radial artery to his first diagonal, and then sequential vein graft to  his posterior descending and posterolateral.  There was no graftable  circumflex target.  Postoperatively, he did well without any significant  complications.  He did have some atrial fibrillation, which was treated  with amiodarone.  He saw [name] yesterday.  His amiodarone was  decreased to once a day, Imdur was decreased from 30 to 15 mg daily due  to headaches and his metoprolol was changed to 50 mg once daily.  The  patient states he has been having some pain in his right chest.  He  really feels like this is more muscular in the pectoralis.  He complains  of numbness in the lateral aspect of his left forearm and numbness in  the fifth finger on his left hand.  He has not had any anginal-type  symptoms.  He was having headaches that improved last night with a  decrease of his Imdur 15 mg daily.   CURRENT MEDICATIONS:  1. Amiodarone 200 mg daily.  2. Imdur 15 mg daily.  3. Metoprolol 50 mg daily.  4. Aspirin 325 mg daily.  5. Altace 5 mg daily.  6. Vytorin 10/20 one tablet daily.  7. Ambien 5 mg nightly p.r.n.   PHYSICAL EXAMINATION:  The patient is a well-appearing 55 year old white  male, in no acute distress.  His blood pressure is 103/64, pulse 56,  respirations are 16, and oxygen saturation is 97% on room air.  His  lungs are clear to auscultation and percussion.  Cardiac exam has  regular rate and rhythm.  Normal S1 and S2.  No rubs, murmurs, or  gallops.  His sternal incision is clean, dry, and intact.  Sternum is  stable without clicking or popping.  His left radial incision has 2  areas of slight skin  separation.  There is some visible suture material,  which was removed.  There is some eschar.  There is sort of inflammatory  changes at the skin edges.  There is no signs of infection.  His leg  wound is healing well.   DIAGNOSTIC TESTS:  Chest x-ray shows good aeration of the lungs  bilaterally.   IMPRESSION:  The patient is doing extremely well now.  He is about 3  weeks out from coronary bypass grafting.  He is still very early in the  recovery process.  He is starting to see an improvement in his appetite.  He still is having some difficulty sleeping, but feels better when he  takes pain medication prior to going to sleep.  He has been getting up  several times during the night to take care of his wife, which is also  affected his sleep pattern.  He has not had any anginal-type symptoms.  The pains he describes are very typical.  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  His  sensation in his thumb is intact.  Overall, I think the patient is progressing extremely well and should  continue to progress significantly over the next several weeks.  He is  at least a month away from being able to start back to work and it may  be closer to 2 months, but he has very good judgment.  I will let him  try it a month and if he feels like it is just too much for him, at that  time we will keep him out for another month.  He may begin cardiac rehab  at anytime, it should be hearing from them within the next week or two.  He does not feel like he is ready to begin driving yet.  I do think he  will be ready within the next week or two and he was just advised to be  very cautious when he starts out with that.  He still not to lift any  objects weighing greater than 10 pounds for another 3 weeks.  He should  continue the Imdur for another 3 weeks and the amiodarone for another 3-  5 weeks.  He will continue to be  followed by [name] and Dr. Arvilla Market,  but I will be happy to see him back at anytime if I can be of any  further assistance with his care.   Salvatore Decent Dorris Fetch, M.D.  Electronically Signed   SCH/MEDQ  D:  01/13/2010  T:  01/13/2010  Job:  [id number]   cc:   Nanetta Batty, M.D.  Donia Guiles, M.D.

## 2011-01-24 NOTE — Assessment & Plan Note (Signed)
OFFICE VISIT   GALVIN, AVERSA  DOB:  01/22/1956                                        Jan 20, 2010  CHART #:  [id number]   HISTORY:  The patient returns today secondary to concerns about his left  arm radial artery harvest incision.  I had seen him in the office on Jan 13, 2010, at which time it was noted that he had some redness and it  really looked like quite inflammatory response to the Vicryl.  The skin  edges did not look infected.  I did tell him he could try a topical  antibiotic if he wanted to, but he was allergic to Neosporin.  He had  used something previously that had been prescribed through his primary  care office, they recommended he use bacitracin on it.  He used that for  4-5 days.  At which point, he developed a significant reaction to that  with redness, hives, and drainage from the skin around the incision, not  really from the incision itself.  He also developed some bumps on the  back of his arm.  He stopped using the bacitracin, the worst day was  Monday and it has improved somewhat since then.  He has not had any  fevers or chills.   PHYSICAL EXAMINATION:  His sternal wound is clean, dry, and intact.  His  right arm wound, the incision itself is intact with a couple of small  areas of eschar formation, 1-cm width on both sides, perfectly  symmetrical.  There is a resolving maculopapular rash.  There are some  small papules on the dorsal aspect of the arm as well.   IMPRESSION:  The patient has an allergic reaction to bacitracin, we will  add that to his list of allergies along with Neosporin.  I do not think  he needs any topical antibiotics, it looks like this is all an allergic  reaction.  It is far too uniform to be an infection.  It was recommended  that he keep it clean and dry.  If it itches, he can use some over-the-  counter hydrocortisone cream and/or  take an antihistamine such as Benadryl.  He will call the office if  this  is not healed over the next couple of weeks.   Salvatore Decent Dorris Fetch, M.D.  Electronically Signed   SCH/MEDQ  D:  01/20/2010  T:  01/20/2010  Job:  [id number]   cc:   Nanetta Batty, M.D.  Donia Guiles, M.D.

## 2011-01-27 NOTE — Discharge Summary (Signed)
NAMEQUINLIN, Maurice Park                  ACCOUNT NO.:  [id number]   MEDICAL RECORD NO.:  [id number]          PATIENT TYPE:  INP   LOCATION:  3313                         FACILITY:  [facility]   PHYSICIAN:  Larina Earthly, M.D.    DATE OF BIRTH:  04-Oct-1955   DATE OF ADMISSION:  05/03/2006  DATE OF DISCHARGE:  05/04/2006                                 DISCHARGE SUMMARY   PRIMARY DIAGNOSIS:  Right internal carotid and artery stenosis, Mucomyst.   SECONDARY DIAGNOSES:  1. History of myocardial infarction in 1999 status post percutaneous      transluminal cardiac angioplasty.  2. Hypertension.  3. Status post renal arterial angioplasty.   IN-HOUSE OPERATIONS AND PROCEDURES:  Right carotid surgery with Dacron patch  angioplasty.   THE PATIENT'S HISTORY AND PHYSICAL AND HOSPITAL COURSE:  Maurice Park is a 55-  year-old male who presented to Dr. Arbie Cookey for evaluation very severe right  internal carotid artery stenosis.  The patient had severe and diffuse  atherosclerotic disease.  The patient reports that father died at the heart  attach in his mid 72s.  The patient's past medical history of myocardial  infarction dating back to 1999 which was treated with stenting.  Status post  past medical history of  hypertension and status post right renal artery  angioplasty.  The patient was seen and evaluated by Dr. Arbie Cookey.  Dr. Arbie Cookey  discussed with patient  undergoing right carotid endarterectomy with Dacron  patch angioplasty.  He discussed risks and benefits with the patient.  The  patient voiced understanding and agreed to proceed.  Surgery was scheduled  for May 03, 2006.   For details of patient's past medical history and physical exam, please see  dictated history and physical.   The patient was taken to the operating room on May 04, 2006, where he  underwent right carotid endarterectomy with Dacron patch angioplasty.  The  patient tolerated this procedure well and was transferred up to the  intensive care unit in stable condition.  The evening of surgery, the  patient was seen to be alert and oriented x4.  Neurologically intact.   The patient's postoperative course was pretty much unremarkable.  Postop day  #1,  he was out of bed ambulating well.  Neurologically remained intact.  He  was seen to be hemodynamically stable.  Hemoglobin 12, hematocrit 37.1.  Vital signs were monitored and seen to be stable.  The patient was afebrile.  He was saturating greater than 90% on room air.  Incision was clean, dry and  intact and healing well.  The patient tolerated a regular diet well.  He did  complain of shortness of breath __________ overnight, and due to the  patient's history of MI, cardiac enzymes were obtained and seemed to be  negative.  The patient seemed to be in normal sinus rhythm.  On exam, lungs  were clear to auscultation bilaterally.   The patient was discharged to home postop day #1, May 04, 2006, in stable  condition.   FOLLOWUP APPOINTMENTS:  Followup appointment was arranged with Dr.  Early for  May 21, 2006 at 1:40 p.m.Marland Kitchen   ACTIVITY:  Patient instructed no driving until released to do so. No heavy  lifting over 10 pounds.  Told to ambulate 3-4 times per day and progress as  tolerated.  Continue breathing exercises.   INCISIONAL CARE:  The patient was told to shower, washing his incisions  using soap and water.  He is to contact the office if he develops any  drainage or opening from any of his incision sites.   DIET:  The patient is instructed on diet to be low-fat, low-salt.   DISCHARGE MEDICATIONS:  1. Tylox 1-2 tablets q. 4- 6 hours p.r.n. pain.  2. Plavix 75 mg daily.  3. Toprol XL 50 mg daily.  4. Altace 5 mg daily.  5. Norvasc 10 mg daily.  6. Vitorin 10/20 mg daily.  7. Aspirin 81 mg daily.      Maurice Park, [place]      Larina Earthly, M.D.  Electronically Signed    KMD/MEDQ  D:  05/21/2006  T:  05/21/2006  Job:  [id number]

## 2011-01-27 NOTE — Cardiovascular Report (Signed)
. [facility]  Patient:    Maurice Park, Maurice Park                         MRN: [id number] Proc. Date: 02/21/00 Adm. Date:  [id number] Disc. Date: [id number] Attending:  Berry, Jonathan [place] CC:         [facility]             Cardiac Catheterization Laboratory             Desma Maxim, M.D.                        Cardiac Catheterization  PROCEDURE PERFORMED:  Cardiac catheterization, intravascular ultrasound procedure, percutaneous transluminal coronary angioplasty.  INDICATIONS:  Maurice Park is a 55 year old married Filipino male, with a history of CAD, status post posterolateral MI April 1999.  He has had redilatation at his posterolateral stent and has had renal PTA and stenting.  He was admitted on June 8 because of unstable angina and ruled out for myocardial infarction. He had no ECG changes.  He was placed on heparin and nitroglycerin and presents now for diagnostic coronary arteriography.  DESCRIPTION OF PROCEDURE:  The patient was brought to the second floor [facility] in the postabsorptive state.  He was premedicate with p.o. Valium and IV Versed and Nubain.  His right groin was prepped and shaved in the usual sterile fashion.  Xylocaine 1% was used for local anesthesia.  A 6 French sheath was inserted into the right femoral artery using standard Seldinger technique.  A 6 French right and left Judkins diagnostic catheter as well as a 6 French pigtail catheter were used for selective coronary angiography, left ventriculography, distal abdominal aortography and selective right renal angiography.  Omnipaque dye was used for entirety of the diagnostic case.  Retrograde, aortic, left ventricular and pullback pressures were recorded.  HEMODYNAMICS: 1. Aortic systolic pressure 156, diastolic pressure 90. 2. Left ventricular systolic pressure 156, diastolic pressure 22.  SELECTIVE  CORONARY ANGIOGRAPHY: 1. Left main:  Left main was small but did not appear to be obstructive.    There was damping with a 6 French left Judkins diagnostic catheter. 2. Left anterior descending:  The LAD had a 40% segmental proximal stenosis.    It gave off an early medium to large first diagonal branch which had a    70% hazy ostial stenosis.  The second diagonal branch was slightly smaller    and arose from the mid LAD and had approximately 60% proximal stenosis. 3. Left circumflex:  This is a small nondominant vessel with an 80% ostial    stenosis unchanged from prior catheterization. 4. Right coronary artery:  Right coronary artery is a dominant vessel with 50%    segmental ostial/proximal stenosis.  There was 60% stenosis within the    posterolateral stent.  However this appeared to be smooth and not    significantly changed from the last angiogram.  LEFT VENTRICULOGRAPHY:  The RAO left ventriculogram was performed using 20 cc of Omnipaque dye at 10 cc per second.  The overall LVEF was estimated at greater than 60% without focal wall motion abnormalities.  DISTAL ABDOMINAL AORTOGRAPHY: Distal abdominal aortogram was performed using 20 cc of Omnipaque dye at 20 cc per second.  Selective right renal angiography was also performed.  There was approximately 50% ostial in-stent re-stenosis within the right renal  artery stent.  The infrarenal abdominal aorta and iliac bifurcation appear free of significant atherosclerotic changes.  IMPRESSION:  Maurice Park anatomy is not subsequently changed since his last catheterization in October; however, he is symptomatic and I believe that we need to further investigate the plaque burden within his posterolateral stent, thus we will proceed with intravascular ultrasound using the Atlantis IVUS catheter.  DESCRIPTION OF IVUS PROCEDURE:  The patient received 3500 units of heparin with an ACT documented to 60.  Using a 7 [place] guide catheter with  side holes along with an 0.014 190 BMW guidewire and an Atlantis IVUS catheter, intravascular ultrasound was performed.  The patient was on aspirin and Plavix preprocedure.  Distal reference segment measured approximately 3.2.  There was significant plaque burden within the posterolateral stent.  IVUS was also performed within the proximal segment of the RCA revealing a reference segment of 4.2 to 4.4 mm and a residual lumen of 2.2 to 2.5 mm which did not appear to be significant.  DESCRIPTION OF PTCA PROCEDURE:  A 6 French sheath was inserted into the right femoral vein.  Integrilin double bolus infusion was begun.  Using a 3.25 x 10 mm long Quantum Monorail balloon, PTCA was performed at 12 atmospheres which reproduced the patients clinical syndrome.  A total of 200 mcg of intracoronary nitroglycerin was administered.  The final angiographic result was reduction of a 60% in-stent posterolateral branch re-stenosis to less than 20% residual.  The patient tolerated the procedure well.  The guide catheters were removed.  The sheaths were sewn securely in place.  The patient left the lab in stable condition.  Heparin will be discontinued and the sheaths will be removed once the ACT falls below 150.  Integrilin will continue for 18 hours.  The patient will be discharged home in the morning with close outpatient followup.  He left the lab in stable condition. DD:  02/21/00 TD:  02/23/00 Job: [id number]

## 2011-01-27 NOTE — Op Note (Signed)
Maurice Park, Maurice Park                  ACCOUNT NO.:  [id number]   MEDICAL RECORD NO.:  [id number]          PATIENT TYPE:  INP   LOCATION:  2899                         FACILITY:  [facility]   PHYSICIAN:  Larina Earthly, M.D.    DATE OF BIRTH:  [birth year]   DATE OF PROCEDURE:  05/03/2006  DATE OF DISCHARGE:                                 OPERATIVE REPORT   PREOPERATIVE DIAGNOSIS:  Severe right internal carotid artery stenosis,  asymptomatic.   POSTOPERATIVE DIAGNOSIS:  Severe right internal carotid artery stenosis,  asymptomatic.   PROCEDURE:  Right carotid endarterectomy, Dacron patch angioplasty.   SURGEON:  Larina Earthly, M.D.   ASSISTANT:  Jerold Coombe, P.A.   ANESTHESIA:  General endotracheal.   COMPLICATIONS:  None.   DISPOSITION:  To recovery room stable, neurologically intact.   PROCEDURE IN DETAIL:  The patient was taken to the operating room and placed  in the supine position, where  the area of the right neck prepped and draped  in the usual sterile fashion.  Incision made anterior to the  sternocleidomastoid and carried down through the platysma with  electrocautery.  The sternocleidomastoid was __________ posteriorly and the  carotid sheath was opened.  The facial vein was ligated 2-0 silk ties and  divided.  The vagus and hypoglossal nerves were identified and preserved.  The common carotid artery was encircled umbilical tape Rumel tourniquet.  Dissection was carried onto the bifurcation and the superior thyroid artery  was circled with a 2-0 silk Potts tie, the external carotid was circled with  a blue vessel loop and the internal carotid circled with an umbilical tape  Rumel tourniquet.  The patient was given 8000 units of intravenous heparin.  After adequate circulation time the internal, external and common carotid  arteries were occluded.  The common carotid artery was opened with an 11  blade, extended longitudinally with Potts scissors through the plaque  onto  the internal carotid artery.  The patient had a severe stenosis at the  carotid bifurcation.  A #10 shunt was passed down the common carotid,  allowed to back bleed, then up the internal carotid, where it was secured  with Rumel tourniquets.  The endarterectomy was begun on the common carotid  artery and the plaque was divided proximally with Potts scissors.  The  endarterectomy was carried onto the bifurcation and the external carotid  endarterectomized by an eversion technique and the internal carotid  endarterectomized in an open fashion.  Remaining atheromatous debris was  removed from the endarterectomy plane.  A Finesse Hemashield Dacron patch  was brought onto the field and was sewn as a patch angioplasty with a  running 6-0 Prolene suture.  Prior to completion of the anastomosis, the  shunt was occluded and the usual flushing maneuvers were undertaken.  The  anastomosis was completed and the external, followed by the common, and  finally the internal carotid occlusion clamp was removed.  Excellent flow  characteristics were noted with handheld Doppler in the internal and  external carotid arteries.  The patient was given 50  mg of protamine to  reverse the heparin.  The wounds were irrigated with saline.  Hemostasis  with cautery.  The  wounds were closed with several 3-0 Vicryl sutures to reapproximate  sternocleidomastoid over the carotid sheath.  Next, the platysma was closed  with 3-0 Vicryl sutures and finally the skin was closed with 4-0  subcuticular Vicryl stitch.  A sterile dressing was applied and the patient  was taken to the recovery room neurologically intact.      Larina Earthly, M.D.  Electronically Signed     TFE/MEDQ  D:  05/03/2006  T:  05/03/2006  Job:  [id number]   cc:   Nanetta Batty, M.D.  Donia Guiles, M.D.

## 2011-04-12 HISTORY — PX: TRANSTHORACIC ECHOCARDIOGRAM: SHX275

## 2011-04-12 HISTORY — PX: NM MYOCAR IMG MI: HXRAD627

## 2011-05-19 IMAGING — CR DG CHEST 1V PORT
1 series · 1 of 1 positions shown · non-contrast
Comparison: 12/20/2009

CLINICAL DATA: CABG.  Chest tubes.

PORTABLE CHEST - 1 VIEW

[view not recorded]
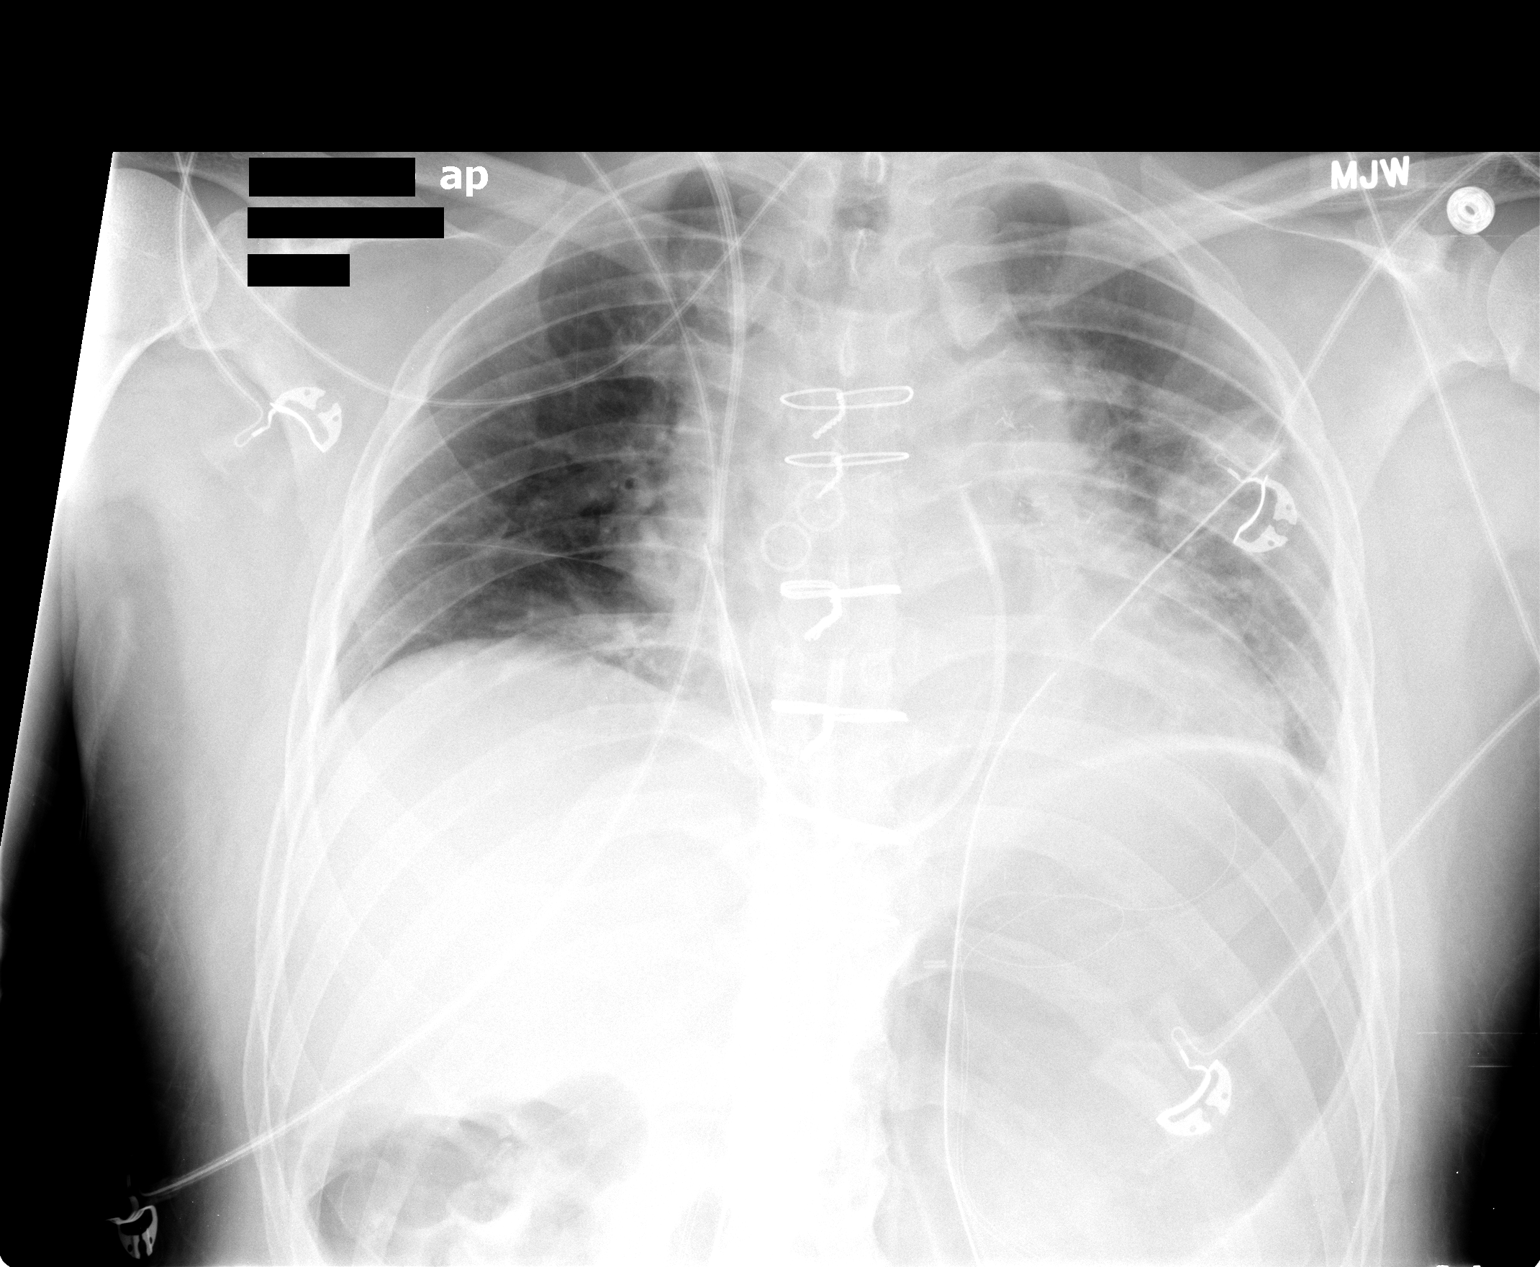

[1 of 1 positions shown; findings below may reference images not displayed]

FINDINGS: There is stable moderate enlargement of the cardiac
silhouette. The patient has undergone previous median sternotomy
and coronary artery bypass grafting. Left chest tube is in place.
Mediastinal drain is in place.  Right internal jugular Swan-Ganz
catheter is in place with tip terminating in area of main pulmonary
artery .  There are low lung volumes with mild basilar atelectasis
right greater than left.  No pleural effusion or pneumothorax is
seen.  There is interval removal of the endotracheal tube and the
enteric tube.
IMPRESSION: The support apparatus is described above. There is interval removal
of endotracheal and enteric tubes. There are low lung volumes with
basilar atelectasis.  The patient is status post CABG.

## 2011-05-20 IMAGING — CR DG CHEST 1V PORT
1 series · 1 of 1 positions shown · non-contrast
Comparison: Portable chest x-ray of 12/21/2009

CLINICAL DATA: CABG, follow-up

PORTABLE CHEST - 1 VIEW

[view not recorded]
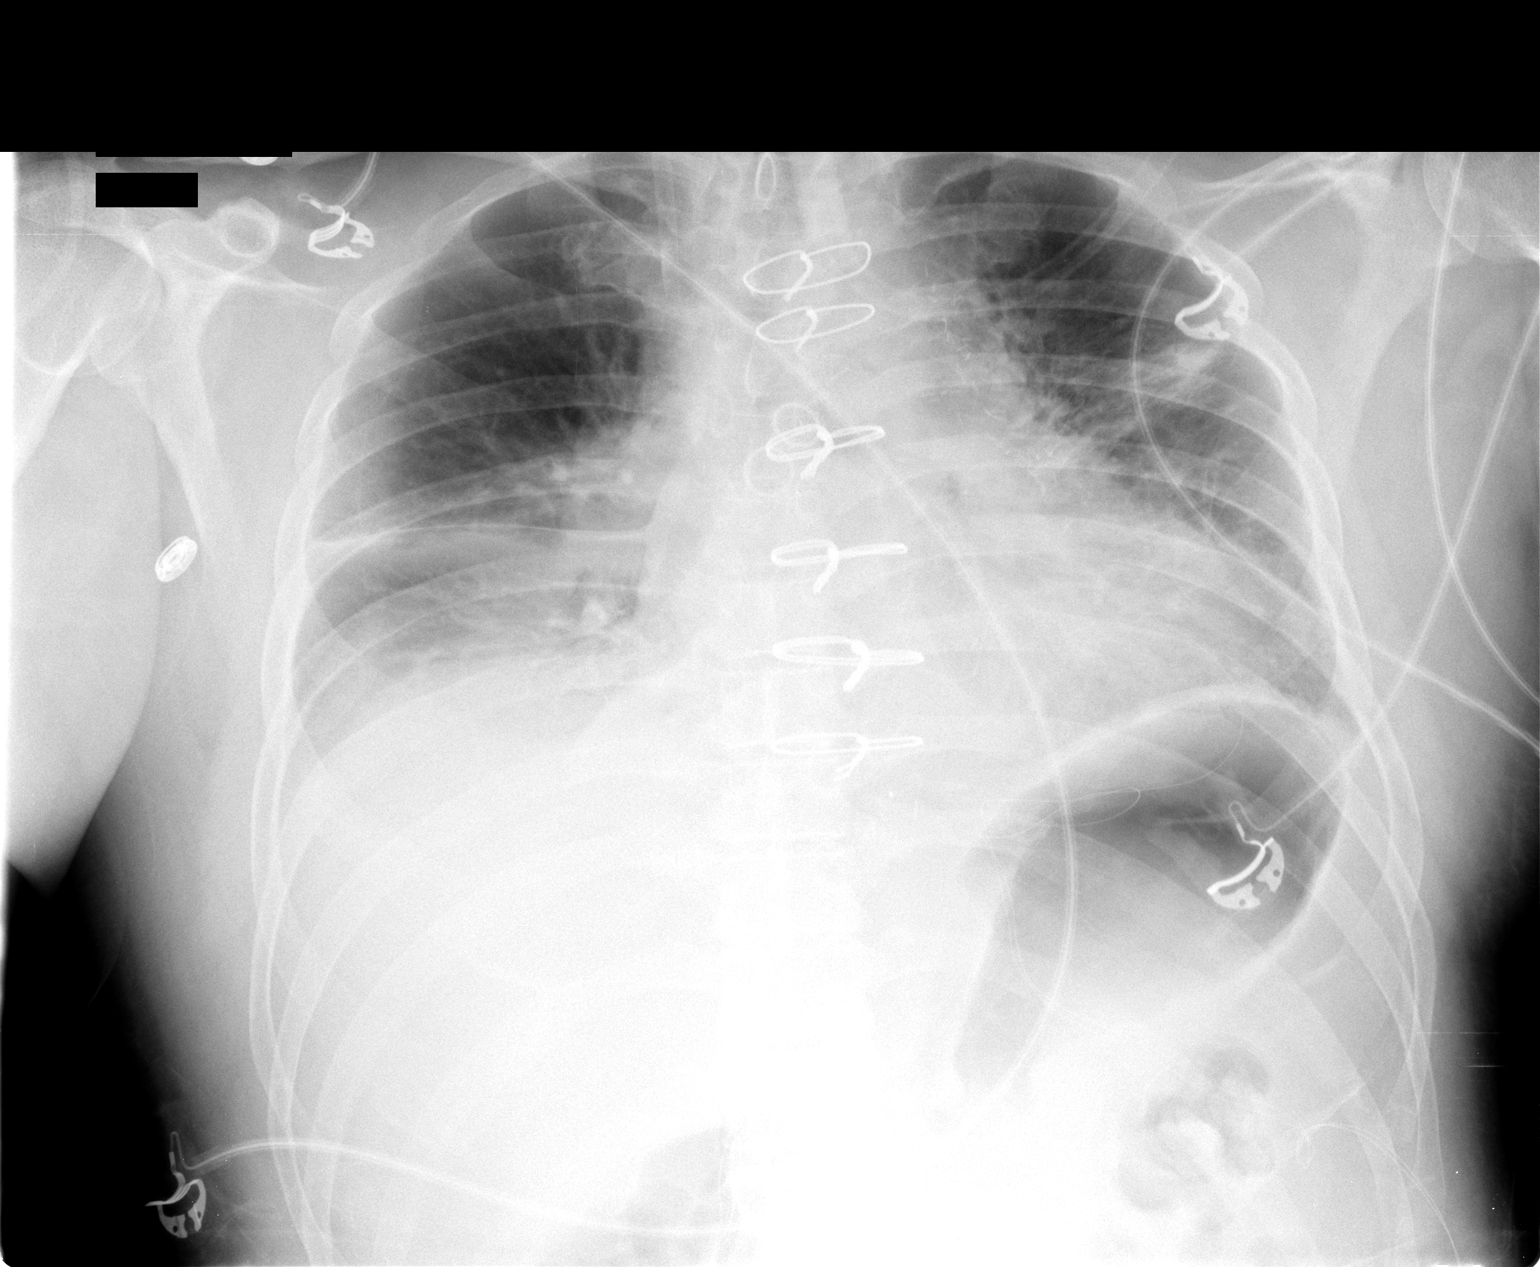

[1 of 1 positions shown; findings below may reference images not displayed]

FINDINGS: Aeration has improved slightly.  There is still basilar
atelectasis present right greater than left with mild pulmonary
vascular congestion present.  A venous sheath remains in the SVC.
Cardiomegaly is stable.
IMPRESSION: Slightly better aeration.  Persistent basilar atelectasis and
possible small effusions.  Mild pulmonary vascular congestion.

## 2011-05-22 IMAGING — CR DG CHEST 2V
2 series · 2 of 2 positions shown · non-contrast
Comparison: 12/22/2009

CLINICAL DATA: Bypass surgery.

CHEST - 2 VIEW

[w chest pa]
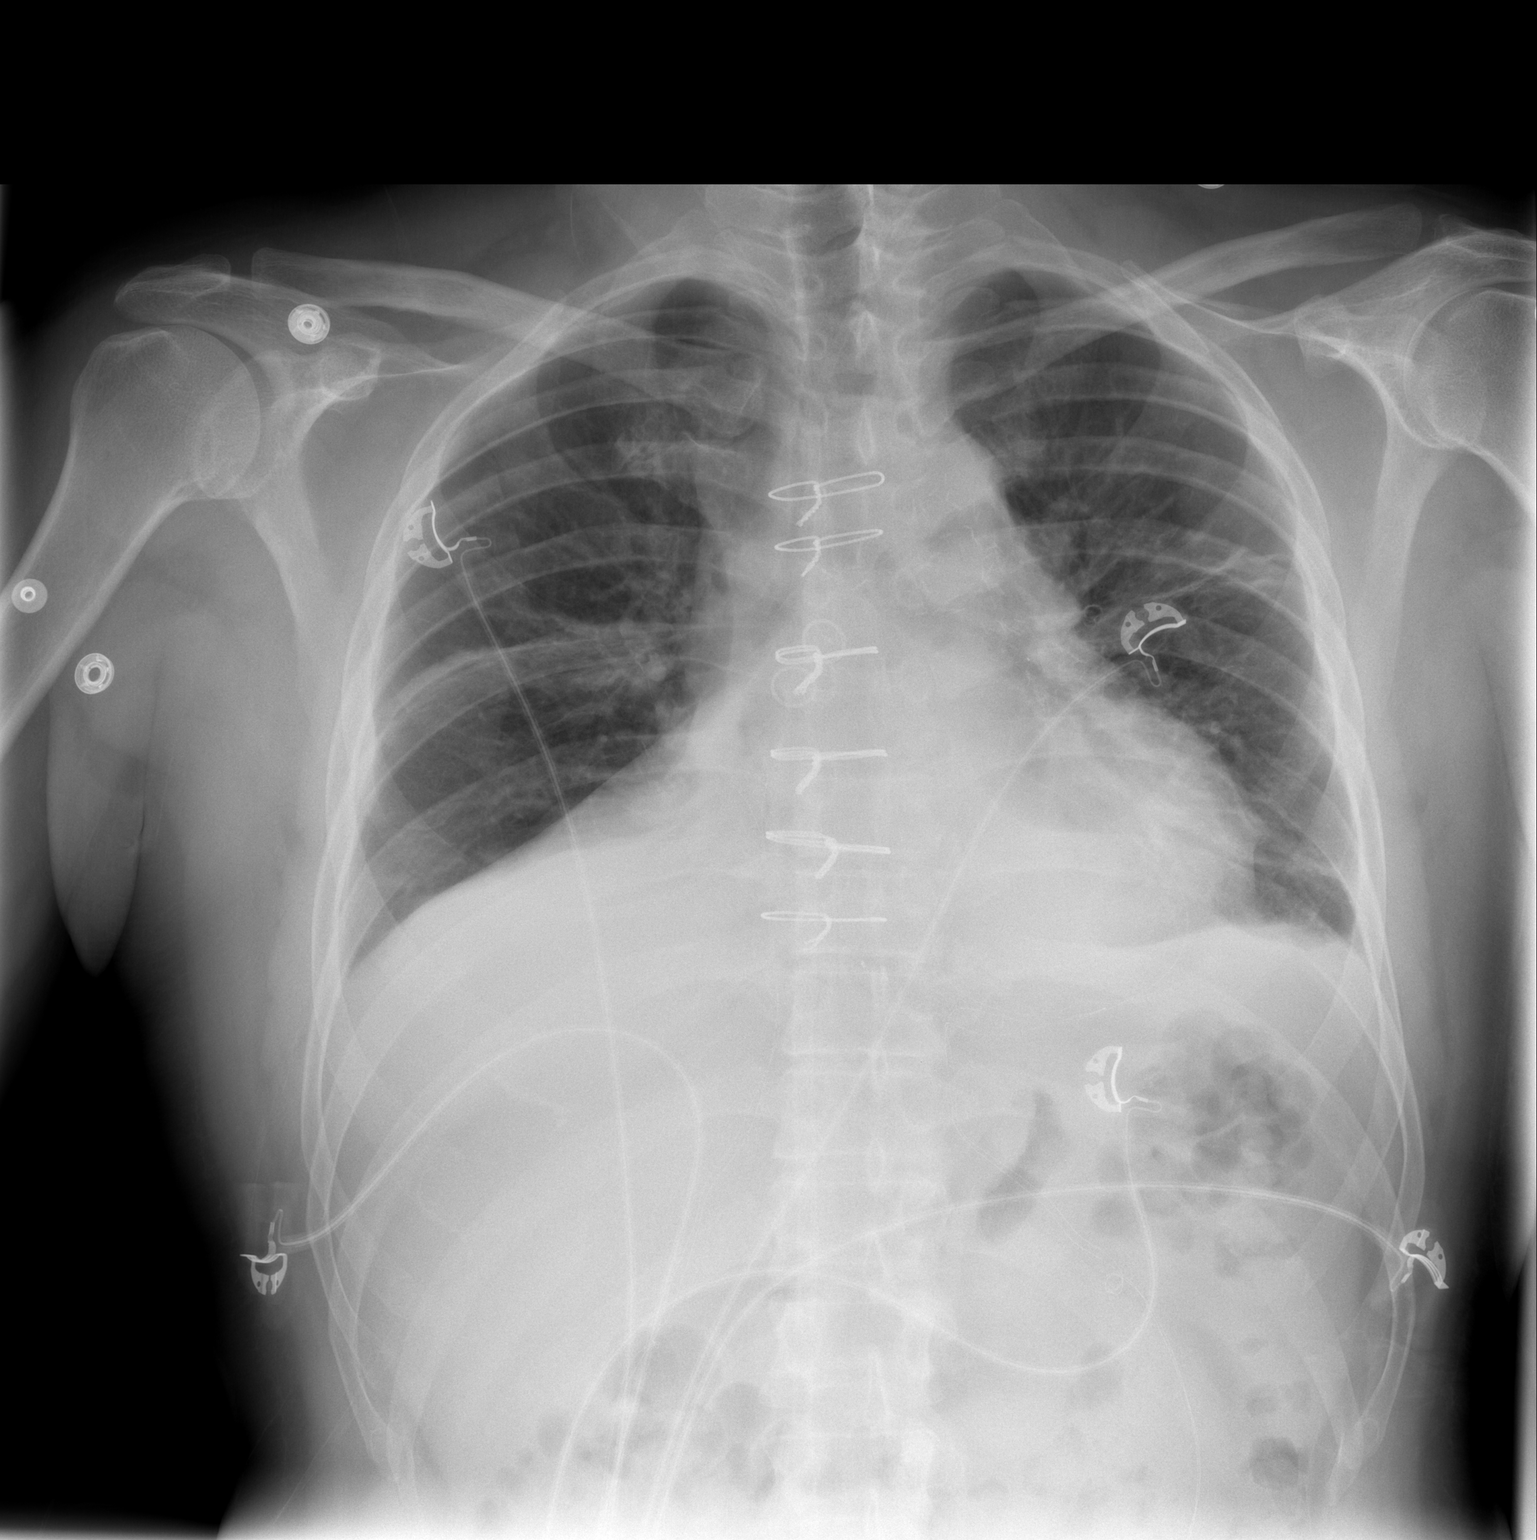

[w chest lat]
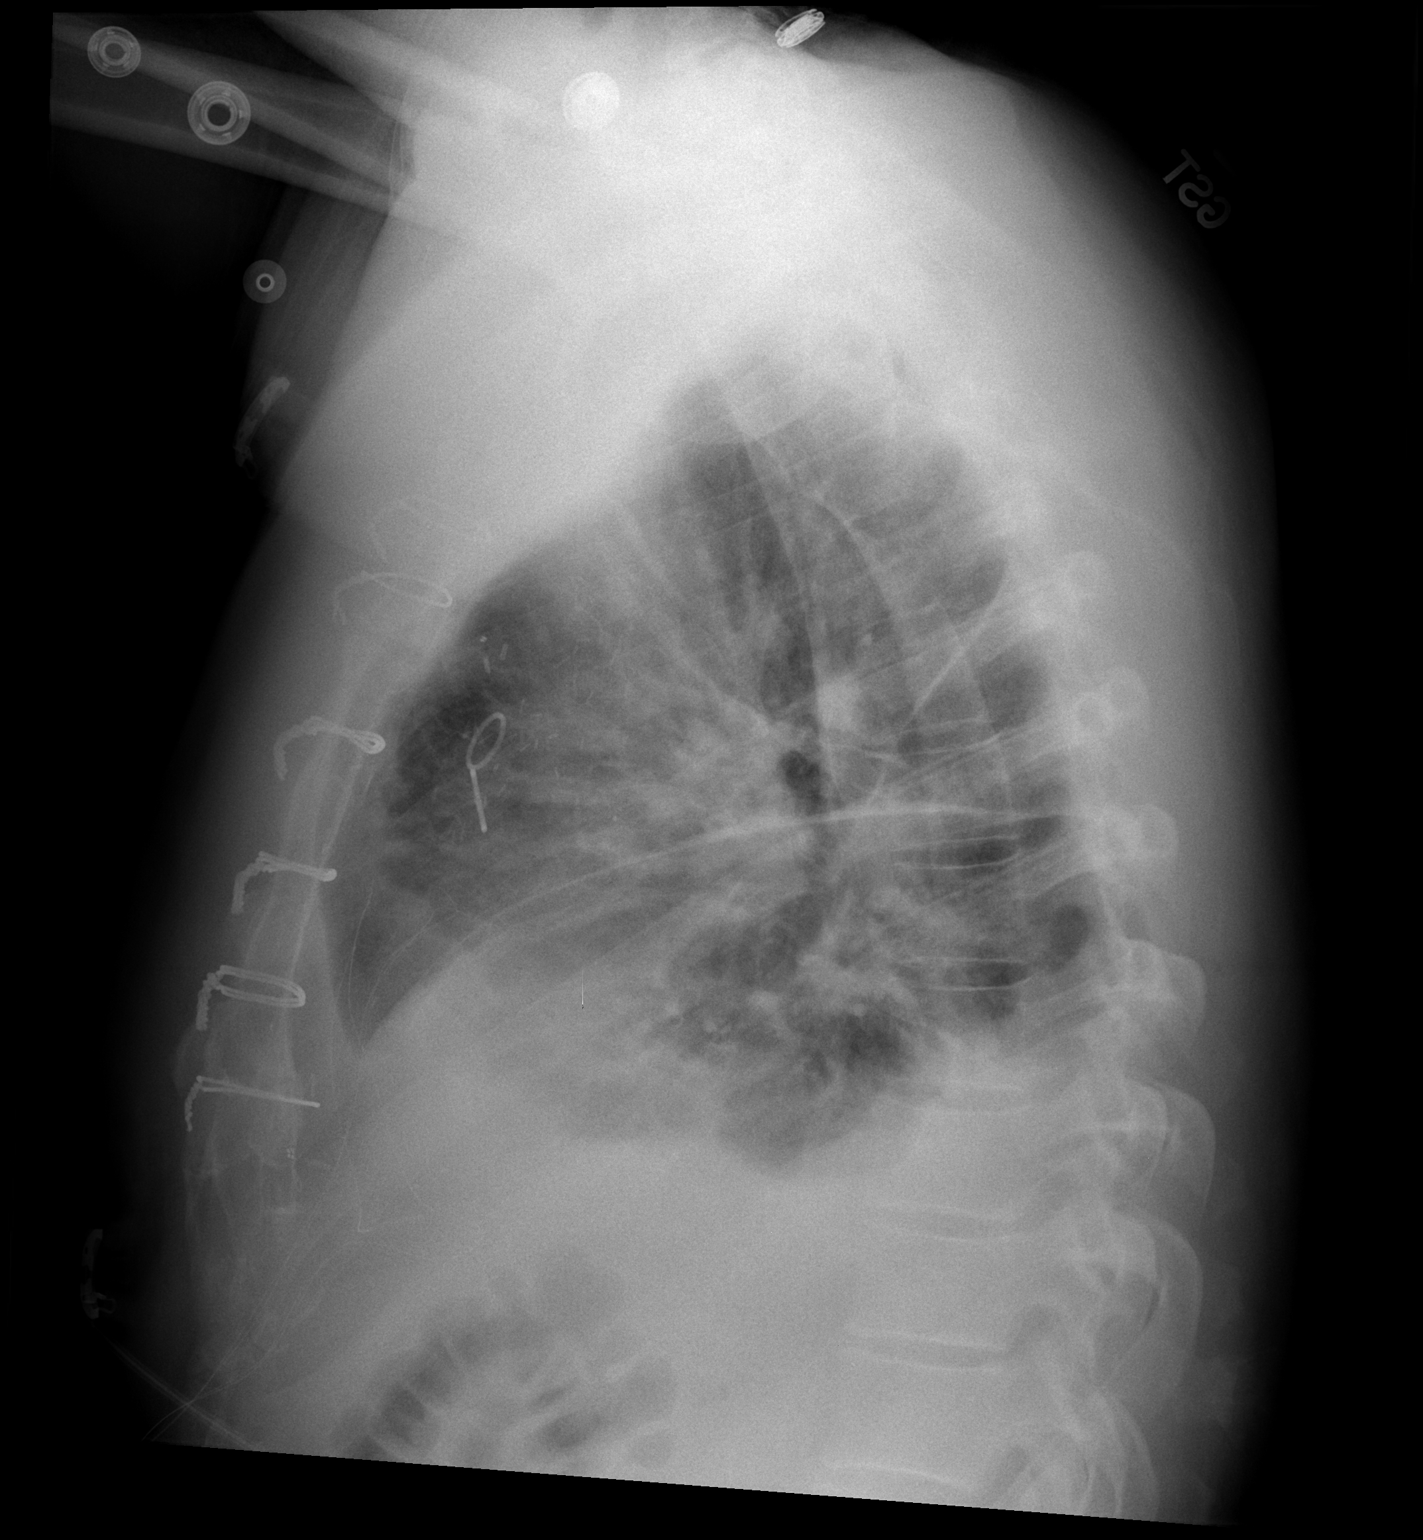

[2 of 2 positions shown; findings below may reference images not displayed]

FINDINGS: The right IJ Cordis has been removed.  There are
bilateral pleural effusions and bibasilar atelectasis but overall
slight improved lung aeration.  No pulmonary edema.  No
pneumothorax.
IMPRESSION: 1.  Removal of right IJ Cordis.
2.  Bilateral pleural effusions and bibasilar atelectasis.
3.  Overall improved aeration.

## 2011-10-02 ENCOUNTER — Encounter (HOSPITAL_COMMUNITY): Payer: Self-pay | Admitting: Cardiology

## 2011-10-02 ENCOUNTER — Inpatient Hospital Stay (HOSPITAL_COMMUNITY)
Admission: AD | Admit: 2011-10-02 | Discharge: 2011-10-04 | DRG: 287 | Disposition: A | Discharge status: Home / Self Care | Payer: 59 | Source: Ambulatory Visit | Attending: Cardiology | Admitting: Cardiology

## 2011-10-02 ENCOUNTER — Other Ambulatory Visit: Payer: Self-pay

## 2011-10-02 ENCOUNTER — Inpatient Hospital Stay (HOSPITAL_COMMUNITY): Payer: 59

## 2011-10-02 DIAGNOSIS — I208 Other forms of angina pectoris: Secondary | ICD-10-CM | POA: Diagnosis present

## 2011-10-02 DIAGNOSIS — I739 Peripheral vascular disease, unspecified: Secondary | ICD-10-CM | POA: Diagnosis present

## 2011-10-02 DIAGNOSIS — F4323 Adjustment disorder with mixed anxiety and depressed mood: Secondary | ICD-10-CM | POA: Diagnosis present

## 2011-10-02 DIAGNOSIS — Z7982 Long term (current) use of aspirin: Secondary | ICD-10-CM

## 2011-10-02 DIAGNOSIS — Z8249 Family history of ischemic heart disease and other diseases of the circulatory system: Secondary | ICD-10-CM

## 2011-10-02 DIAGNOSIS — I200 Unstable angina: Secondary | ICD-10-CM | POA: Diagnosis present

## 2011-10-02 DIAGNOSIS — Z91041 Radiographic dye allergy status: Secondary | ICD-10-CM

## 2011-10-02 DIAGNOSIS — I25810 Atherosclerosis of coronary artery bypass graft(s) without angina pectoris: Principal | ICD-10-CM | POA: Diagnosis present

## 2011-10-02 DIAGNOSIS — I2510 Atherosclerotic heart disease of native coronary artery without angina pectoris: Secondary | ICD-10-CM | POA: Diagnosis present

## 2011-10-02 DIAGNOSIS — E785 Hyperlipidemia, unspecified: Secondary | ICD-10-CM | POA: Diagnosis present

## 2011-10-02 DIAGNOSIS — Z79899 Other long term (current) drug therapy: Secondary | ICD-10-CM

## 2011-10-02 DIAGNOSIS — Z87891 Personal history of nicotine dependence: Secondary | ICD-10-CM

## 2011-10-02 DIAGNOSIS — Z9861 Coronary angioplasty status: Secondary | ICD-10-CM

## 2011-10-02 DIAGNOSIS — I2089 Other forms of angina pectoris: Secondary | ICD-10-CM | POA: Diagnosis present

## 2011-10-02 HISTORY — DX: Anxiety disorder, unspecified: F41.9

## 2011-10-02 HISTORY — DX: Hyperlipidemia, unspecified: E78.5

## 2011-10-02 HISTORY — DX: Peripheral vascular disease, unspecified: I73.9

## 2011-10-02 HISTORY — DX: Cardiac arrhythmia, unspecified: I49.9

## 2011-10-02 HISTORY — DX: Adjustment disorder with mixed anxiety and depressed mood: F43.23

## 2011-10-02 HISTORY — DX: Atherosclerotic heart disease of native coronary artery without angina pectoris: I25.10

## 2011-10-02 HISTORY — DX: Angina pectoris, unspecified: I20.9

## 2011-10-02 LAB — COMPREHENSIVE METABOLIC PANEL
ALT: 26 U/L (ref 0–53)
AST: 23 U/L (ref 0–37)
Albumin: 4.1 g/dL (ref 3.5–5.2)
Alkaline Phosphatase: 58 U/L (ref 39–117)
BUN: 16 mg/dL (ref 6–23)
CO2: 31 mEq/L (ref 19–32)
Calcium: 9.3 mg/dL (ref 8.4–10.5)
Chloride: 102 mEq/L (ref 96–112)
Creatinine, Ser: 1.23 mg/dL (ref 0.50–1.35)
GFR calc Af Amer: 75 mL/min — ABNORMAL LOW (ref >90)
GFR calc non Af Amer: 65 mL/min — ABNORMAL LOW (ref >90)
Glucose, Bld: 98 mg/dL (ref 70–99)
Potassium: 4.2 mEq/L (ref 3.5–5.1)
Sodium: 140 mEq/L (ref 135–145)
Total Bilirubin: 0.3 mg/dL (ref 0.3–1.2)
Total Protein: 7 g/dL (ref 6.0–8.3)

## 2011-10-02 LAB — CBC
HCT: 43 % (ref 39.0–52.0)
Hemoglobin: 14.7 g/dL (ref 13.0–17.0)
MCH: 31.7 pg (ref 26.0–34.0)
MCHC: 34.2 g/dL (ref 30.0–36.0)
MCV: 92.7 fL (ref 78.0–100.0)
Platelets: 196 10*3/uL (ref 150–400)
RBC: 4.64 MIL/uL (ref 4.22–5.81)
RDW: 12.9 % (ref 11.5–15.5)
WBC: 6.5 10*3/uL (ref 4.0–10.5)

## 2011-10-02 LAB — APTT: aPTT: 32 seconds (ref 24–37)

## 2011-10-02 LAB — MAGNESIUM: Magnesium: 2.3 mg/dL (ref 1.5–2.5)

## 2011-10-02 LAB — DIFFERENTIAL
Basophils Absolute: 0 10*3/uL (ref 0.0–0.1)
Basophils Relative: 1 % (ref 0–1)
Eosinophils Absolute: 0.3 10*3/uL (ref 0.0–0.7)
Eosinophils Relative: 4 % (ref 0–5)
Lymphocytes Relative: 26 % (ref 12–46)
Lymphs Abs: 1.7 10*3/uL (ref 0.7–4.0)
Monocytes Absolute: 0.7 10*3/uL (ref 0.1–1.0)
Monocytes Relative: 10 % (ref 3–12)
Neutro Abs: 3.9 10*3/uL (ref 1.7–7.7)
Neutrophils Relative %: 59 % (ref 43–77)

## 2011-10-02 LAB — PROTIME-INR
INR: 0.95 (ref 0.00–1.49)
Prothrombin Time: 12.9 seconds (ref 11.6–15.2)

## 2011-10-02 LAB — CARDIAC PANEL(CRET KIN+CKTOT+MB+TROPI)
CK, MB: 1.6 ng/mL (ref 0.3–4.0)
Relative Index: 1.5 (ref 0.0–2.5)
Total CK: 108 U/L (ref 7–232)
Troponin I: <0.3 ng/mL (ref <0.30)

## 2011-10-02 LAB — MRSA PCR SCREENING: MRSA by PCR: NEGATIVE

## 2011-10-02 MED ORDER — ACETAMINOPHEN 325 MG PO TABS
650.0000 mg | ORAL_TABLET | ORAL | Status: DC | PRN
  Administered 2011-10-02 – 2011-10-03 (×3): 650 mg via ORAL
  Filled 2011-10-02 (×3): qty 2

## 2011-10-02 MED ORDER — NITROGLYCERIN 2 % TD OINT
0.5000 [in_us] | TOPICAL_OINTMENT | Freq: Four times a day (QID) | TRANSDERMAL | Status: DC
  Administered 2011-10-02 – 2011-10-03 (×2): 0.5 [in_us] via TOPICAL
  Filled 2011-10-02: qty 30

## 2011-10-02 MED ORDER — METOPROLOL SUCCINATE ER 50 MG PO TB24
50.0000 mg | ORAL_TABLET | Freq: Every day | ORAL | Status: DC
  Administered 2011-10-03 – 2011-10-04 (×2): 50 mg via ORAL
  Filled 2011-10-02 (×2): qty 1

## 2011-10-02 MED ORDER — SODIUM CHLORIDE 0.9 % IJ SOLN: 3.0000 mL | Freq: Two times a day (BID) | INTRAMUSCULAR | Status: DC

## 2011-10-02 MED ORDER — ASPIRIN 81 MG PO CHEW
324.0000 mg | CHEWABLE_TABLET | ORAL | Status: AC
  Administered 2011-10-03: 324 mg via ORAL
  Filled 2011-10-02: qty 4

## 2011-10-02 MED ORDER — SODIUM CHLORIDE 0.9 % IV SOLN
INTRAVENOUS | Status: DC
  Administered 2011-10-02: 10 mL/h via INTRAVENOUS

## 2011-10-02 MED ORDER — METHYLPREDNISOLONE SODIUM SUCC 125 MG IJ SOLR
125.0000 mg | INTRAMUSCULAR | Status: AC
  Administered 2011-10-03: 125 mg via INTRAVENOUS
  Filled 2011-10-02: qty 2

## 2011-10-02 MED ORDER — DIAZEPAM 5 MG PO TABS
5.0000 mg | ORAL_TABLET | ORAL | Status: AC
  Administered 2011-10-03: 5 mg via ORAL
  Filled 2011-10-02: qty 1

## 2011-10-02 MED ORDER — DIPHENHYDRAMINE HCL 50 MG/ML IJ SOLN
25.0000 mg | INTRAMUSCULAR | Status: AC
  Administered 2011-10-03: 25 mg via INTRAVENOUS
  Filled 2011-10-02: qty 1

## 2011-10-02 MED ORDER — SODIUM CHLORIDE 0.9 % IV SOLN: 250.0000 mL | INTRAVENOUS | Status: DC | PRN

## 2011-10-02 MED ORDER — ASPIRIN 325 MG PO TABS
325.0000 mg | ORAL_TABLET | Freq: Once | ORAL | Status: AC
  Administered 2011-10-02: 325 mg via ORAL
  Filled 2011-10-02: qty 1

## 2011-10-02 MED ORDER — ASPIRIN EC 81 MG PO TBEC
81.0000 mg | DELAYED_RELEASE_TABLET | Freq: Every day | ORAL | Status: DC
  Filled 2011-10-02: qty 1

## 2011-10-02 MED ORDER — ALPRAZOLAM 0.25 MG PO TABS
0.2500 mg | ORAL_TABLET | Freq: Two times a day (BID) | ORAL | Status: DC | PRN
  Administered 2011-10-03: 0.25 mg via ORAL
  Filled 2011-10-02: qty 1

## 2011-10-02 MED ORDER — SODIUM CHLORIDE 0.9 % IV SOLN: INTRAVENOUS | Status: DC

## 2011-10-02 MED ORDER — EZETIMIBE-SIMVASTATIN 10-20 MG PO TABS
1.0000 | ORAL_TABLET | Freq: Every day | ORAL | Status: DC
  Filled 2011-10-02: qty 1

## 2011-10-02 MED ORDER — SODIUM CHLORIDE 0.9 % IJ SOLN: 3.0000 mL | INTRAMUSCULAR | Status: DC | PRN

## 2011-10-02 MED ORDER — PANTOPRAZOLE SODIUM 40 MG PO TBEC
40.0000 mg | DELAYED_RELEASE_TABLET | Freq: Every day | ORAL | Status: DC
  Administered 2011-10-02 – 2011-10-04 (×3): 40 mg via ORAL
  Filled 2011-10-02 (×3): qty 1

## 2011-10-02 MED ORDER — ASPIRIN 300 MG RE SUPP
300.0000 mg | RECTAL | Status: DC
  Filled 2011-10-02: qty 1

## 2011-10-02 MED ORDER — FAMOTIDINE IN NACL 20-0.9 MG/50ML-% IV SOLN
20.0000 mg | INTRAVENOUS | Status: AC
  Administered 2011-10-03: 20 mg via INTRAVENOUS

## 2011-10-02 MED ORDER — HEPARIN BOLUS VIA INFUSION
4000.0000 [IU] | Freq: Once | INTRAVENOUS | Status: AC
  Filled 2011-10-02: qty 4000

## 2011-10-02 MED ORDER — NITROGLYCERIN 0.4 MG SL SUBL: 0.4000 mg | SUBLINGUAL_TABLET | SUBLINGUAL | Status: DC | PRN

## 2011-10-02 MED ORDER — ASPIRIN 81 MG PO CHEW: 324.0000 mg | CHEWABLE_TABLET | ORAL | Status: DC

## 2011-10-02 MED ORDER — MORPHINE SULFATE 2 MG/ML IJ SOLN: 2.0000 mg | INTRAMUSCULAR | Status: DC | PRN

## 2011-10-02 MED ORDER — ONDANSETRON HCL 4 MG/2ML IJ SOLN: 4.0000 mg | Freq: Four times a day (QID) | INTRAMUSCULAR | Status: DC | PRN

## 2011-10-02 MED ORDER — ZOLPIDEM TARTRATE 5 MG PO TABS
10.0000 mg | ORAL_TABLET | Freq: Every evening | ORAL | Status: DC | PRN
  Administered 2011-10-03: 10 mg via ORAL
  Filled 2011-10-02 (×2): qty 1

## 2011-10-02 MED ORDER — RAMIPRIL 5 MG PO CAPS
5.0000 mg | ORAL_CAPSULE | Freq: Every day | ORAL | Status: DC
  Administered 2011-10-02 – 2011-10-04 (×3): 5 mg via ORAL
  Filled 2011-10-02 (×3): qty 1

## 2011-10-02 MED ORDER — HEPARIN SOD (PORCINE) IN D5W 100 UNIT/ML IV SOLN
1100.0000 [IU]/h | INTRAVENOUS | Status: DC
  Administered 2011-10-02: 1100 [IU]/h via INTRAVENOUS
  Administered 2011-10-02: 4000 [IU]/h via INTRAVENOUS
  Filled 2011-10-02 (×2): qty 250

## 2011-10-02 NOTE — H&P (Signed)
SAYED APOSTOL is an 56 y.o. male.    Cardiologist: Dr. Erlene Quan Chief Complaint: Chest pressure, not feeling well - very fatigued. HPI: 56Year old MM,with history of CAD  With stent to posterolateral branch 2001. Later that year in stent restenosis with PTCA.  April 2011 with [place] needing CABG. With LIMA to his LAD,  Left radial to diag. Branch , sequential vein to the PDA and PLA.  [name] has been under a great deal of stress with his wife in end stages of ALS (bulbar Onset) now complete care with trach, vent support and gastrostomy tube.    Since December he has had chest pressure that continues, though not just like the pain prior to his CABG. His anxiety increases and may be playing a role but today in the office NTG did relieve the discomfort, though it did cause a headache.  EKG without acute changes.  He is concerned he will die in his sleep.  He has one young son that he also cares for, plus stressful job.  Discussed with DR. Raquel Sarna had a 2D echo in August 2012 and stress myoview both were normal. Those tests were done for similar discomfort but milder discomfort.  Pt is being admitted for cardiac eval including cardiac cath in am.  Will begin IV Heparin, NTG paste.  Other cardiac history includes PAF after CABG, but none since.   Hx. Of Rt CEA, and also history of stent to rt. Renal artery.   Other history see below.   Past Medical History  Diagnosis Date  . CAD (coronary artery disease), with CABG [place] to LAD, Lt. radial artery to 1st diagnal, sequential SVG to PDA and PLA, 12/20/09 10/02/2011  . Hyperlipidemia, controlled 10/02/2011  . Situational mixed anxiety and depressive disorder,  10/02/2011  . Angina   . Anxiety   . Peripheral vascular disease   . Dysrhythmia     Past Surgical History  Procedure Date  . Coronary artery bypass graft   . Coronary angioplasty     Family History  Problem Relation Age of Onset  . Hypertension Mother   . Coronary artery disease Father     Social History:  reports that he quit smoking about 22 years ago. He has never used smokeless tobacco. He reports that he drinks about .6 ounces of alcohol per week. He reports that he does not use illicit drugs. Married, 1 child, His wife is with Hospice secondary to ALS.  Allergies:  Allergies  Allergen Reactions  . Contrast Media (Iodinated Diagnostic Agents)   . Neosporin (Neomycin-Polymyx-Gramicid)     OUTPATIENT MEDICATIONS: Toprol XL 50 mg. One daily ASA 325 mg daily Altace 5mg . Daily Vytorin 10/20  Daily Ambien 5 mg prn NTG SL prn MVI daily  No results found for this or any previous visit (from the past 48 hour(s)). No results found.  ROS: General: no colds, fevers, + anxiety Skin:  No rashes HEENT: no blurred vision CV:  See HPI PUL: no SOB GI: no diarrhea, no constipation GU: no dysuria MS: no complaints Neuro: no syncope.   BP: 132/84 p:61 R: 20  Wt: 167  HT 5'5"   PE: General:A&O, pleasant but stress male, cries easily with chest pain Skin: W&D, brisk capillary refill Heent:  Normocephalic, sclera clear. Neck: supple, no JVD, no bruits  Heart: S1S2, RRR, no obvious murmur, gallup, rub or click Lungs: Clear without rales, rhonchi or wheezes. Abd: soft non tender, +BS Ext: no edema, + pedal pulses  Neuro: A&O X 3, follows commands, MAE.    Assessment/Plan Patient Active Problem List  Diagnoses  . Angina at rest  . CAD (coronary artery disease), with CABG [place] to LAD, Lt. radial artery to 1st diagnal, sequential SVG to PDA and PLA, 12/20/09  . Hyperlipidemia, controlled  . Situational mixed anxiety and depressive disorder,    PLAN:Admit to stepdown. Serial cardiac enzymes, IV heparin, NTG paste. Plan for cardiac cath in am.  If grafts patent, pt. May benefit from SSRI or prn antianxiety medications.  ZOXWRU,EAVWU R 10/02/2011, 5:28 PM Agree with note written by Nada Boozer RNP  Pt wellknown to me with H/O CADS/PCABG, PVOD S/P CEA and renal  stenting.Other probs as outlined. Recent neg myoview and 2D 8/12. He is under a lot of stress at home with a dying spouse.Developed symptoms C/W [place] over the past few days relieved with SL NTG. Currently pain free.Exam benign. EKG w/o acute changes.Labs ok.Plan cor angio today.Pt agreeable.  Runell Gess 10/03/2011 7:43 AM

## 2011-10-02 NOTE — Plan of Care (Signed)
Problem: Consults Goal: Cardiac Cath Patient Education (See Patient Education module for education specifics.) Outcome: Completed/Met Date Met:  10/02/11 Pt's had a cath in the past and stated he's aware of what takes place during a cath.

## 2011-10-02 NOTE — Progress Notes (Signed)
ANTICOAGULATION CONSULT NOTE - Initial Consult  Pharmacy Consult for Heparin Indication: chest pain/ACS  Allergies  Allergen Reactions  . Codeine Nausea Only  . Contrast Media (Iodinated Diagnostic Agents)   . Neosporin (Neomycin-Polymyx-Gramicid)     Patient Measurements: Height: 5\' 5"  (165.1 cm) Weight: 167 lb (75.751 kg) IBW/kg (Calculated) : 61.5   Vital Signs:    Labs: No results found for this basename: HGB:2,HCT:3,PLT:3,APTT:3,LABPROT:3,INR:3,HEPARINUNFRC:3,CREATININE:3,CKTOTAL:3,CKMB:3,TROPONINI:3 in the last 72 hours Estimated Creatinine Clearance: 69.8 ml/min (by C-G formula based on Cr of 1.15).  Medical History: Past Medical History  Diagnosis Date  . CAD (coronary artery disease), with CABG [place] to LAD, Lt. radial artery to 1st diagnal, sequential SVG to PDA and PLA, 12/20/09 10/02/2011  . Hyperlipidemia, controlled 10/02/2011  . Situational mixed anxiety and depressive disorder,  10/02/2011  . Angina   . Anxiety   . Peripheral vascular disease   . Dysrhythmia     Medications:  Scheduled:    . aspirin  324 mg Oral NOW   Or  . aspirin  300 mg Rectal NOW  . aspirin  324 mg Oral Pre-Cath  . aspirin EC  81 mg Oral Daily  . diazepam  5 mg Oral On Call  . diphenhydrAMINE  25 mg Intravenous On Call  . ezetimibe-simvastatin  1 tablet Oral q1800  . famotidine (PEPCID) IV  20 mg Intravenous On Call  . methylPREDNISolone (SOLU-MEDROL) injection  125 mg Intravenous On Call  . metoprolol succinate  50 mg Oral Daily  . nitroGLYCERIN  0.5 inch Topical Q6H  . pantoprazole  40 mg Oral Q1200  . ramipril  5 mg Oral Daily  . sodium chloride  3 mL Intravenous Q12H    Assessment: 56 year old with history of CAD, CABG, and increased stressed admitted with ongoing chest pressure.  Cath planned in AM  Goal of Therapy:  Heparin level 0.3-0.7 units/ml   Plan:  1) Heparin 4000 units IV bolus x 1 2) Heparin drip at 1150 units / hr 3) Heparin level 6 hours after heparin  starts 4) Daily heparin level, CBC  Thank you.  Okey Regal, PharmD  Elwin Sleight 10/02/2011,8:03 PM

## 2011-10-03 ENCOUNTER — Other Ambulatory Visit: Payer: Self-pay

## 2011-10-03 ENCOUNTER — Encounter (HOSPITAL_COMMUNITY)
Admission: AD | Disposition: A | Discharge status: Home / Self Care | Payer: Self-pay | Source: Ambulatory Visit | Attending: Cardiology

## 2011-10-03 ENCOUNTER — Encounter (HOSPITAL_COMMUNITY): Payer: Self-pay | Admitting: Cardiology

## 2011-10-03 ENCOUNTER — Ambulatory Visit (HOSPITAL_COMMUNITY): Admit: 2011-10-03 | Payer: Self-pay | Admitting: Cardiovascular Disease

## 2011-10-03 HISTORY — PX: CARDIAC CATHETERIZATION: SHX172

## 2011-10-03 HISTORY — PX: LEFT HEART CATHETERIZATION WITH CORONARY/GRAFT ANGIOGRAM: SHX5450

## 2011-10-03 LAB — BASIC METABOLIC PANEL
BUN: 14 mg/dL (ref 6–23)
CO2: 28 mEq/L (ref 19–32)
Calcium: 8.9 mg/dL (ref 8.4–10.5)
Chloride: 101 mEq/L (ref 96–112)
Creatinine, Ser: 1.04 mg/dL (ref 0.50–1.35)
GFR calc Af Amer: >90 mL/min (ref >90)
GFR calc non Af Amer: 80 mL/min — ABNORMAL LOW (ref >90)
Glucose, Bld: 105 mg/dL — ABNORMAL HIGH (ref 70–99)
Potassium: 4 mEq/L (ref 3.5–5.1)
Sodium: 137 mEq/L (ref 135–145)

## 2011-10-03 LAB — LIPID PANEL
Cholesterol: 130 mg/dL (ref 0–200)
HDL: 48 mg/dL (ref >39)
LDL Cholesterol: 66 mg/dL (ref 0–99)
Total CHOL/HDL Ratio: 2.7 RATIO
Triglycerides: 78 mg/dL (ref <150)
VLDL: 16 mg/dL (ref 0–40)

## 2011-10-03 LAB — CARDIAC PANEL(CRET KIN+CKTOT+MB+TROPI)
CK, MB: 1.6 ng/mL (ref 0.3–4.0)
CK, MB: 1.6 ng/mL (ref 0.3–4.0)
Relative Index: INVALID (ref 0.0–2.5)
Relative Index: INVALID (ref 0.0–2.5)
Total CK: 88 U/L (ref 7–232)
Total CK: 74 U/L (ref 7–232)
Troponin I: <0.3 ng/mL (ref <0.30)
Troponin I: <0.3 ng/mL (ref <0.30)

## 2011-10-03 LAB — HEPARIN LEVEL (UNFRACTIONATED): Heparin Unfractionated: 0.44 IU/mL (ref 0.30–0.70)

## 2011-10-03 LAB — POCT ACTIVATED CLOTTING TIME: Activated Clotting Time: 122 seconds

## 2011-10-03 LAB — GLUCOSE, CAPILLARY: Glucose-Capillary: 207 mg/dL — ABNORMAL HIGH (ref 70–99)

## 2011-10-03 LAB — CBC
HCT: 41.2 % (ref 39.0–52.0)
Hemoglobin: 14 g/dL (ref 13.0–17.0)
MCH: 31.5 pg (ref 26.0–34.0)
MCHC: 34 g/dL (ref 30.0–36.0)
MCV: 92.8 fL (ref 78.0–100.0)
Platelets: 179 10*3/uL (ref 150–400)
RBC: 4.44 MIL/uL (ref 4.22–5.81)
RDW: 12.8 % (ref 11.5–15.5)
WBC: 5.8 10*3/uL (ref 4.0–10.5)

## 2011-10-03 LAB — TSH: TSH: 1.263 u[IU]/mL (ref 0.350–4.500)

## 2011-10-03 LAB — HEMOGLOBIN A1C
Hgb A1c MFr Bld: 5.9 % — ABNORMAL HIGH (ref <5.7)
Mean Plasma Glucose: 123 mg/dL — ABNORMAL HIGH (ref <117)

## 2011-10-03 SURGERY — LEFT HEART CATHETERIZATION WITH CORONARY/GRAFT ANGIOGRAM
Anesthesia: LOCAL | Laterality: Right

## 2011-10-03 MED ORDER — ONDANSETRON HCL 4 MG/2ML IJ SOLN: 4.0000 mg | Freq: Four times a day (QID) | INTRAMUSCULAR | Status: DC | PRN

## 2011-10-03 MED ORDER — RAMIPRIL 5 MG PO CAPS
5.0000 mg | ORAL_CAPSULE | Freq: Every day | ORAL | Status: DC
  Administered 2011-10-03: 5 mg via ORAL

## 2011-10-03 MED ORDER — FLUTICASONE PROPIONATE 50 MCG/ACT NA SUSP
2.0000 | Freq: Every day | NASAL | Status: DC
  Administered 2011-10-03 – 2011-10-04 (×2): 2 via NASAL
  Filled 2011-10-03: qty 16

## 2011-10-03 MED ORDER — NITROGLYCERIN 0.2 MG/ML ON CALL CATH LAB
INTRAVENOUS | Status: AC
  Administered 2011-10-03: 17:00:00
  Filled 2011-10-03: qty 1

## 2011-10-03 MED ORDER — METOPROLOL SUCCINATE ER 50 MG PO TB24
50.0000 mg | ORAL_TABLET | Freq: Every day | ORAL | Status: DC
  Administered 2011-10-03: 50 mg via ORAL

## 2011-10-03 MED ORDER — ZOLPIDEM TARTRATE 5 MG PO TABS: 5.0000 mg | ORAL_TABLET | Freq: Every evening | ORAL | Status: DC | PRN

## 2011-10-03 MED ORDER — LIDOCAINE HCL (PF) 1 % IJ SOLN
INTRAMUSCULAR | Status: AC
  Administered 2011-10-03: 17:00:00
  Filled 2011-10-03: qty 30

## 2011-10-03 MED ORDER — SODIUM CHLORIDE 0.9 % IV SOLN
INTRAVENOUS | Status: AC
  Administered 2011-10-03: 12:00:00 via INTRAVENOUS

## 2011-10-03 MED ORDER — EZETIMIBE-SIMVASTATIN 10-20 MG PO TABS
1.0000 | ORAL_TABLET | Freq: Every day | ORAL | Status: DC
  Administered 2011-10-03: 1 via ORAL
  Filled 2011-10-03 (×2): qty 1

## 2011-10-03 MED ORDER — ASPIRIN EC 81 MG PO TBEC
81.0000 mg | DELAYED_RELEASE_TABLET | Freq: Every day | ORAL | Status: DC
  Administered 2011-10-03: 81 mg via ORAL
  Filled 2011-10-03 (×2): qty 1

## 2011-10-03 MED ORDER — NITROGLYCERIN 0.4 MG SL SUBL: 0.4000 mg | SUBLINGUAL_TABLET | SUBLINGUAL | Status: DC | PRN

## 2011-10-03 MED ORDER — MIDAZOLAM HCL 2 MG/2ML IJ SOLN
INTRAMUSCULAR | Status: AC
  Administered 2011-10-03: 17:00:00
  Filled 2011-10-03: qty 2

## 2011-10-03 MED ORDER — MORPHINE SULFATE 2 MG/ML IJ SOLN
2.0000 mg | INTRAMUSCULAR | Status: DC | PRN
  Administered 2011-10-03 (×2): 2 mg via INTRAVENOUS
  Filled 2011-10-03 (×2): qty 1

## 2011-10-03 MED ORDER — HEPARIN (PORCINE) IN NACL 2-0.9 UNIT/ML-% IJ SOLN
INTRAMUSCULAR | Status: AC
  Administered 2011-10-03: 17:00:00
  Filled 2011-10-03: qty 2000

## 2011-10-03 MED ORDER — ACETAMINOPHEN 325 MG PO TABS: 650.0000 mg | ORAL_TABLET | ORAL | Status: DC | PRN

## 2011-10-03 MED ORDER — ALPRAZOLAM 0.25 MG PO TABS: 0.2500 mg | ORAL_TABLET | Freq: Two times a day (BID) | ORAL | Status: DC | PRN

## 2011-10-03 MED ORDER — MORPHINE SULFATE 2 MG/ML IJ SOLN: 1.0000 mg | INTRAMUSCULAR | Status: DC | PRN

## 2011-10-03 MED ORDER — FENTANYL CITRATE 0.05 MG/ML IJ SOLN
INTRAMUSCULAR | Status: AC
  Administered 2011-10-03: 17:00:00
  Filled 2011-10-03: qty 2

## 2011-10-03 NOTE — H&P (Signed)
    Pt was reexamined and existing H & P reviewed. No changes found.  Runell Gess, MD [facility] 10/03/2011 9:56 AM

## 2011-10-03 NOTE — Progress Notes (Signed)
ANTICOAGULATION CONSULT NOTE - Follow Up Consult  Pharmacy Consult for heparin Indication: chest pain/ACS  Allergies  Allergen Reactions  . Codeine Nausea Only  . Contrast Media (Iodinated Diagnostic Agents)   . Neosporin (Neomycin-Polymyx-Gramicid)     Patient Measurements: Height: 5\' 5"  (165.1 cm) Weight: 167 lb (75.751 kg) IBW/kg (Calculated) : 61.5   Vital Signs: Temp: 98.2 F (36.8 C) (01/22 0400) Temp src: Oral (01/22 0400) BP: 100/61 mmHg (01/22 0400) Pulse Rate: 60  (01/22 0400)  Labs:  Maurice Park 10/03/11 0307 10/02/11 2041  HGB 14.0 14.7  HCT 41.2 43.0  PLT 179 196  APTT -- 32  LABPROT -- 12.9  INR -- 0.95  HEPARINUNFRC 0.44 --  CREATININE 1.04 1.23  CKTOTAL 88 108  CKMB 1.6 1.6  TROPONINI <0.30 <0.30   Estimated Creatinine Clearance: 77.2 ml/min (by C-G formula based on Cr of 1.04).  Assessment: 56yo male therapeutic on heparin with initial dosing for CP.  Scheduled for cath today at noon.  Goal of Therapy:  Heparin level 0.3-0.7 units/ml   Plan:  Will continue heparin at current rate and confirm stable with additional level.  Colleen Can PharmD BCPS 10/03/2011,5:34 AM

## 2011-10-03 NOTE — Progress Notes (Signed)
[facility]  Subjective: He last felt Cp at 0200hrs.  Described as a pinching feeling.  Morphine resolved it.    Objective: Vital signs in last 24 hours: Temp:  [97.7 F (36.5 C)-98.2 F (36.8 C)] 98.2 F (36.8 C) (01/22 0400) Pulse Rate:  [55-74] 60  (01/22 0400) Resp:  [10-63] 10  (01/22 0400) BP: (100-141)/(61-89) 100/61 mmHg (01/22 0400) SpO2:  [96 %-99 %] 98 % (01/22 0400) Weight:  [75.751 kg (167 lb)] 75.751 kg (167 lb) (01/21 1900) Last BM Date: 10/02/11  Intake/Output from previous day: 01/21 0701 - 01/22 0700 In: 297.9 [P.O.:140; I.V.:157.9] Out: 725 [Urine:725] Intake/Output this shift: Total I/O In: 297.9 [P.O.:140; I.V.:157.9] Out: 725 [Urine:725]  Medications Current Facility-Administered Medications  Medication Dose Route Frequency Provider Last Rate Last Dose  . 0.9 %  sodium chloride infusion   Intravenous Continuous Leone Brand, NP 10 mL/hr at 10/02/11 2034 10 mL/hr at 10/02/11 2034  . 0.9 %  sodium chloride infusion  250 mL Intravenous PRN Leone Brand, NP      . 0.9 %  sodium chloride infusion   Intravenous Continuous Leone Brand, NP 75 mL/hr at 10/03/11 0356    . acetaminophen (TYLENOL) tablet 650 mg  650 mg Oral Q4H PRN Leone Brand, NP   650 mg at 10/03/11 0207  . ALPRAZolam Prudy Feeler) tablet 0.25 mg  0.25 mg Oral BID PRN Leone Brand, NP   0.25 mg at 10/03/11 0207  . aspirin chewable tablet 324 mg  324 mg Oral Pre-Cath Leone Brand, NP      . aspirin EC tablet 81 mg  81 mg Oral Daily Leone Brand, NP      . aspirin tablet 325 mg  325 mg Oral Once Marykay Lex, MD   325 mg at 10/02/11 2256  . diazepam (VALIUM) tablet 5 mg  5 mg Oral On Call Leone Brand, NP      . diphenhydrAMINE (BENADRYL) injection 25 mg  25 mg Intravenous On Call Leone Brand, NP      . ezetimibe-simvastatin (VYTORIN) 10-20 MG per tablet 1 tablet  1 tablet Oral q1800 Leone Brand, NP      . famotidine (PEPCID) IVPB 20 mg  20 mg  Intravenous On Call Leone Brand, NP      . heparin ADULT infusion 100 units/ml (25000 units/250 ml)  1,100 Units/hr Intravenous Continuous Marykay Lex, MD 11 mL/hr at 10/02/11 2100 1,100 Units/hr at 10/02/11 2100  . heparin bolus via infusion 4,000 Units  4,000 Units Intravenous Once Marykay Lex, MD      . methylPREDNISolone sodium succinate (SOLU-MEDROL) 125 MG injection 125 mg  125 mg Intravenous On Call Leone Brand, NP      . metoprolol succinate (TOPROL-XL) 24 hr tablet 50 mg  50 mg Oral Daily Leone Brand, NP      . morphine 2 MG/ML injection 2 mg  2 mg Intravenous Q2H PRN Marykay Lex, MD   2 mg at 10/03/11 0239  . nitroGLYCERIN (NITROGLYN) 2 % ointment 0.5 inch  0.5 inch Topical Q6H Leone Brand, NP   0.5 inch at 10/02/11 2155  . nitroGLYCERIN (NITROSTAT) SL tablet 0.4 mg  0.4 mg Sublingual Q5 min PRN Leone Brand, NP      . ondansetron [facility]) injection 4 mg  4 mg Intravenous Q6H PRN Leone Brand, NP      .  pantoprazole (PROTONIX) EC tablet 40 mg  40 mg Oral Q1200 Leone Brand, NP   40 mg at 10/02/11 2154  . ramipril (ALTACE) capsule 5 mg  5 mg Oral Daily Leone Brand, NP   5 mg at 10/02/11 2154  . sodium chloride 0.9 % injection 3 mL  3 mL Intravenous Q12H Leone Brand, NP      . sodium chloride 0.9 % injection 3 mL  3 mL Intravenous PRN Leone Brand, NP      . zolpidem (AMBIEN) tablet 10 mg  10 mg Oral QHS PRN Leone Brand, NP      . DISCONTD: aspirin chewable tablet 324 mg  324 mg Oral NOW Leone Brand, NP      . DISCONTD: aspirin suppository 300 mg  300 mg Rectal NOW Leone Brand, NP      . DISCONTD: morphine 2 MG/ML injection 2 mg  2 mg Intravenous Q2H PRN Leone Brand, NP        PE: General appearance: alert, cooperative and no distress Lungs: clear to auscultation bilaterally and Faint left. mid region wheeze. Heart: regular rate and rhythm, S1, S2 normal, no murmur, click, rub or gallop Extremities: No LEE Pulses: 2+ and  symmetric  Lab Results:   Basename 10/03/11 0307 10/02/11 2041  WBC 5.8 6.5  HGB 14.0 14.7  HCT 41.2 43.0  PLT 179 196   BMET  Basename 10/03/11 0307 10/02/11 2041  NA 137 140  K 4.0 4.2  CL 101 102  CO2 28 31  GLUCOSE 105* 98  BUN 14 16  CREATININE 1.04 1.23  CALCIUM 8.9 9.3   PT/INR  Basename 10/02/11 2041  LABPROT 12.9  INR 0.95   Cholesterol  Basename 10/03/11 0307  CHOL 130   Cardiac Enzymes Troponin < 0.30 x2  Studies/Results: PORTABLE CHEST - 1 VIEW  Comparison: Plain films of the chest 01/13/2010.  Findings: Lungs are clear. Heart size is normal with postoperative  change of CABG noted. No pneumothorax or effusion.  IMPRESSION:  No acute finding     Assessment/Plan  Principal Problem:  *Angina at rest Active Problems:  CAD (coronary artery disease), with CABG [place] to LAD, Lt. radial artery to 1st diagnal, sequential SVG to PDA and PLA, 12/20/09  Hyperlipidemia, controlled  Situational mixed anxiety and depressive disorder,   Plan:  CP resolved.  For left heart cath today.   EKG shows NSR. BP and HR are controlled and stable.  His wife is in the late stages of ALS.  Very stressful for him.   LOS: 1 day    HAGER,BRYAN W 10/03/2011 5:42 AM   Agree with note written by Jones Skene PAC  No CP this am.On iv NTG and topical nitrate. Labs ok. For cor angio today.Contrast allergy premeds ordered.  Runell Gess 10/03/2011 7:47 AM

## 2011-10-03 NOTE — Op Note (Signed)
Maurice Park is a 56 y.o. male    [id number] LOCATION:  FACILITY: [facility]  PHYSICIAN: Nanetta Batty, M.D. 12/24/1955   DATE OF PROCEDURE:  10/03/2011  DATE OF DISCHARGE:  [facility]  CARDIAC CATHETERIZATION     History obtained from chart review. 56Year old MM,with history of CAD With stent to posterolateral branch 2001. Later that year in stent restenosis with PTCA. April 2011 with [place] needing CABG. With LIMA to his LAD, Left radial to diag. Branch , sequential vein to the PDA and PLA. Maurice Park has been under a great deal of stress with his wife in end stages of ALS (bulbar Onset) now complete care with trach, vent support and gastrostomy tube.  Since December he has had chest pressure that continues, though not just like the pain prior to his CABG. His anxiety increases and may be playing a role but today in the office NTG did relieve the discomfort, though it did cause a headache. EKG without acute changes. He is concerned he will die in his sleep. He has one young son that he also cares for, plus stressful job.  Discussed with DR. Raquel Sarna had a 2D echo in August 2012 and stress myoview both were normal. Those tests were done for similar discomfort but milder discomfort. Pt is being admitted for cardiac eval including cardiac cath in am.     PROCEDURE DESCRIPTION:    The patient was brought to the second floor  [facility]iac cath lab in the postabsorptive state. He was  premedicated with 5 mg of Valium by mouth as well as IV percent and fentanyl.Marland Kitchen His right groin was prepped and shaved in usual sterile fashion. Xylocaine 1% was used  for local anesthesia. A 5 French sheath was inserted into the right common femoral  artery using standard Seldinger technique.5 French right and left Judkins diagnostic catheters along the 5 French pigtail catheter and the right bypass graft catheter were used for selective coronary angiography, selective graft angiography,  and left ventriculography respectively. Visipaque dye was used for the entirety of the case. Retrograde aortic, left ventricular and pullback pressures were recorded.    HEMODYNAMICS:    AO SYSTOLIC/AO DIASTOLIC: 95/58   LV SYSTOLIC/LV DIASTOLIC: 115/15  ANGIOGRAPHIC RESULTS:   1. Left main; 50% long segmental  2. LAD; the LAD exhibited competitive flow from an intact internal mammary graft to 2 diagonal branches both of which were widely patent 3. Left circumflex; nondominant with an 80% ostial stenosis unchanged from prior cath. There was a moderate-sized ramus branch which had a 50% segmental mid stenosis.Marland Kitchen  4. Right coronary artery; dominant and occluded in the midportion. 5.LIMA TO LAD; patent 6. SVG TO PDA and PLA sequentially was widely patent 7. Free left radial artery graft to the diagonal branch was functionally occluded 8 Left ventriculography; RAO left ventriculogram was performed using  25 mL of Visipaque dye at 12 mL/second. The overall LVEF estimated  60 %  Without wall motion abnormalities.  Selective angiography was performed using the right Judkins catheter of the right renal artery stent demonstrating it to be widely patent.  IMPRESSION:Maurice Park has an occluded free left radial graft to diagonal branch. The remainder of his bypass graft grafts are intact. His heart muscle pumping function is normal. I do not think it's occluded radial graft significantly impacts his coronary circulation, and his diet diagonals are perfused by antegrade flow through the left main then from the left internal mammary artery graft. Continue  medical therapy will be recommended. The sheath was removed and pressure was held and the groin to achieve hemostasis. The patient was loudest at the condition. He was gently hydrated overnight and discharged home in the morning.  Runell Gess MD, [facility] 10/03/2011 10:55 AM

## 2011-10-04 ENCOUNTER — Encounter (HOSPITAL_COMMUNITY): Payer: Self-pay | Admitting: Cardiology

## 2011-10-04 MED ORDER — PANTOPRAZOLE SODIUM 40 MG PO TBEC: 40.0000 mg | DELAYED_RELEASE_TABLET | Freq: Every day | ORAL | Status: DC

## 2011-10-04 MED ORDER — ISOSORBIDE MONONITRATE ER 30 MG PO TB24: 30.0000 mg | ORAL_TABLET | Freq: Every day | ORAL | Status: DC

## 2011-10-04 MED ORDER — ACETAMINOPHEN 325 MG PO TABS: 650.0000 mg | ORAL_TABLET | ORAL | Status: AC | PRN

## 2011-10-04 NOTE — Progress Notes (Signed)
Subjective:  No acute concerns last night, pt with out c/o of CP and or anginal equivalent sx.  Objective:  Vital Signs in the last 24 hours: Temp:  [97 F (36.1 C)-98.6 F (37 C)] 97 F (36.1 C) (01/23 0350) Pulse Rate:  [57-63] 61  (01/22 1600) Resp:  [11-20] 12  (01/23 0350) BP: (89-122)/(55-74) 121/70 mmHg (01/23 0350) SpO2:  [93 %-98 %] 97 % (01/23 0350)  Intake/Output from previous day: 01/22 0701 - 01/23 0700 In: 787 [P.O.:650; I.V.:137] Out: 650 [Urine:650] Intake/Output from this shift:      . aspirin  324 mg Oral Pre-Cath  . aspirin EC  81 mg Oral QHS  . diazepam  5 mg Oral On Call  . diphenhydrAMINE  25 mg Intravenous On Call  . ezetimibe-simvastatin  1 tablet Oral QHS  . famotidine (PEPCID) IV  20 mg Intravenous On Call  . fentaNYL      . fluticasone  2 spray Each Nare Daily  . heparin      . lidocaine      . methylPREDNISolone (SOLU-MEDROL) injection  125 mg Intravenous On Call  . metoprolol succinate  50 mg Oral Daily  . midazolam      . nitroGLYCERIN      . pantoprazole  40 mg Oral Q1200  . ramipril  5 mg Oral Daily  . DISCONTD: aspirin EC  81 mg Oral Daily  . DISCONTD: ezetimibe-simvastatin  1 tablet Oral q1800  . DISCONTD: metoprolol succinate  50 mg Oral Daily  . DISCONTD: nitroGLYCERIN  0.5 inch Topical Q6H  . DISCONTD: ramipril  5 mg Oral Daily  . DISCONTD: sodium chloride  3 mL Intravenous Q12H   Physical Exam: Kathryn/AT No JVD Lungs clear RRR S1 S2 nl Abd soft R groin site stable  No edema  Lab Results:  Basename 10/03/11 0307 10/02/11 2041  WBC 5.8 6.5  HGB 14.0 14.7  PLT 179 196    Basename 10/03/11 0307 10/02/11 2041  NA 137 140  K 4.0 4.2  CL 101 102  CO2 28 31  GLUCOSE 105* 98  BUN 14 16  CREATININE 1.04 1.23    Basename 10/03/11 1116 10/03/11 0307  TROPONINI <0.30 <0.30   Hepatic Function Panel  Basename 10/02/11 2041  PROT 7.0  ALBUMIN 4.1  AST 23  ALT 26  ALKPHOS 58  BILITOT 0.3  BILIDIR --  IBILI --     Basename 10/03/11 0307  CHOL 130   Lipid Panel     Component Value Date/Time   CHOL 130 10/03/2011 0307   TRIG 78 10/03/2011 0307   HDL 48 10/03/2011 0307   CHOLHDL 2.7 10/03/2011 0307   VLDL 16 10/03/2011 0307   LDLCALC 66 10/03/2011 0307    No results found for this basename: PROTIME in the last 72 hours  Imaging: Imaging results have been reviewed  Cardiac Studies:  Assessment/Plan:  1) [place], s/p LHC without significant CAD 2)HLD 3)Anxiety d/o 4)Depression  LOS: 2 days    SIMON,SPENCER E 10/04/2011, 5:23 AM     Patient seen and examined. Agree with assessment and plan. Ambulate and plan DC today.  Medical therapy. Add low dose nitrates. FU with Dr. Erin Fulling, MD, [facility] 10/04/2011 8:19 AM

## 2011-10-04 NOTE — Discharge Summary (Signed)
Patient ID: Maurice Park,  MRN: [id number], DOB/AGE: 1955/10/21 56 y.o.  Admit date: 10/02/2011 Discharge date: 10/04/2011  Primary Care Provider:  Primary Cardiologist: Dr Allyson Sabal    Principal Problem:  *Angina at rest, cath this admission showing no significant progression of CAD  Active Problems:  CAD (coronary artery disease), with CABG Lima to LAD, Lt. radial artery to 1st diagnal, sequential SVG to PDA and PLA, 12/20/09  Hyperlipidemia, controlled  Situational mixed anxiety and depressive disorder,     Procedures: Cardiac Cath   Hospital Course: Maurice Park is a 56 year old male followed by [name]. He has a history of coronary disease and peripheral vascular disease. He had a posterior lateral branch stent in June of 2001. He's had prior right renal artery stenosis which has been stented. He had a right carotid endarterectomy in 2007. In April 2011 he had unstable angina was restudied and ultimately had bypass grafting x4. He had an LIMA to his LAD, a left radial artery graft to his diagonal, sequential vein graft to the PDA and PLA. He had paroxysmal atrial fibrillation postoperatively.Renal artery Dopplers as an outpatient had shown patent right renal artery stent. Carotid Dopplers revealed his endarterectomy site to be patent as well. Dr. Gery Pray last saw him in September 2012 after a surveillance Myoview and echocardiogram. Both of these were essentially normal. The patient presented 10/02/2011 with unstable angina. He admits he's been under a great deal of stress, his wife has end-stage ALS. She is on a ventt. The patient was admitted to step down started on IV heparin and nitrates. His enzymes were negative. Diagnostic catheterization was done by Dr. Tresa Endo. This revealed an occluded left radial graft to his diagonal. The remainder of his bypass grafts are intact. His LV function was normal. Plan is for continued medical therapy. We'll try and add low-dose nitrates although he's had  problems tolerating nitrates in the past. He'll follow up with Dr. Gery Pray. Feel he can be discharged 10/04/2011.  Discharge Vitals:  Blood pressure 120/62, pulse 6, temperature 97.9 F (36.6 C), temperature source Oral, resp. rate 12, height 5\' 5"  (1.651 m), weight 75.751 kg (167 lb), SpO2 96.00%.    Labs: Results for orders placed during the hospital encounter of 10/02/11 (from the past 48 hour(s))  MRSA PCR SCREENING     Status: Normal   Collection Time   10/02/11  7:41 PM      Component Value Range Comment   MRSA by PCR NEGATIVE  NEGATIVE    CARDIAC PANEL(CRET KIN+CKTOT+MB+TROPI)     Status: Normal   Collection Time   10/02/11  8:41 PM      Component Value Range Comment   Total CK 108  7 - 232 (U/L)    CK, MB 1.6  0.3 - 4.0 (ng/mL)    Troponin I <0.30  <0.30 (ng/mL)    Relative Index 1.5  0.0 - 2.5    PROTIME-INR     Status: Normal   Collection Time   10/02/11  8:41 PM      Component Value Range Comment   Prothrombin Time 12.9  11.6 - 15.2 (seconds)    INR 0.95  0.00 - 1.49    APTT     Status: Normal   Collection Time   10/02/11  8:41 PM      Component Value Range Comment   aPTT 32  24 - 37 (seconds)   CBC     Status: Normal   Collection Time   10/02/11  8:41 PM      Component Value Range Comment   WBC 6.5  4.0 - 10.5 (K/uL)    RBC 4.64  4.22 - 5.81 (MIL/uL)    Hemoglobin 14.7  13.0 - 17.0 (g/dL)    HCT 16.1  09.6 - 04.5 (%)    MCV 92.7  78.0 - 100.0 (fL)    MCH 31.7  26.0 - 34.0 (pg)    MCHC 34.2  30.0 - 36.0 (g/dL)    RDW 40.9  81.1 - 91.4 (%)    Platelets 196  150 - 400 (K/uL)   DIFFERENTIAL     Status: Normal   Collection Time   10/02/11  8:41 PM      Component Value Range Comment   Neutrophils Relative 59  43 - 77 (%)    Neutro Abs 3.9  1.7 - 7.7 (K/uL)    Lymphocytes Relative 26  12 - 46 (%)    Lymphs Abs 1.7  0.7 - 4.0 (K/uL)    Monocytes Relative 10  3 - 12 (%)    Monocytes Absolute 0.7  0.1 - 1.0 (K/uL)    Eosinophils Relative 4  0 - 5 (%)    Eosinophils  Absolute 0.3  0.0 - 0.7 (K/uL)    Basophils Relative 1  0 - 1 (%)    Basophils Absolute 0.0  0.0 - 0.1 (K/uL)   TSH     Status: Normal   Collection Time   10/02/11  8:41 PM      Component Value Range Comment   TSH 1.263  0.350 - 4.500 (uIU/mL)   COMPREHENSIVE METABOLIC PANEL     Status: Abnormal   Collection Time   10/02/11  8:41 PM      Component Value Range Comment   Sodium 140  135 - 145 (mEq/L)    Potassium 4.2  3.5 - 5.1 (mEq/L)    Chloride 102  96 - 112 (mEq/L)    CO2 31  19 - 32 (mEq/L)    Glucose, Bld 98  70 - 99 (mg/dL)    BUN 16  6 - 23 (mg/dL)    Creatinine, Ser 7.82  0.50 - 1.35 (mg/dL)    Calcium 9.3  8.4 - 10.5 (mg/dL)    Total Protein 7.0  6.0 - 8.3 (g/dL)    Albumin 4.1  3.5 - 5.2 (g/dL)    AST 23  0 - 37 (U/L)    ALT 26  0 - 53 (U/L)    Alkaline Phosphatase 58  39 - 117 (U/L)    Total Bilirubin 0.3  0.3 - 1.2 (mg/dL)    GFR calc non Af Amer 65 (*) >90 (mL/min)    GFR calc Af Amer 75 (*) >90 (mL/min)   MAGNESIUM     Status: Normal   Collection Time   10/02/11  8:41 PM      Component Value Range Comment   Magnesium 2.3  1.5 - 2.5 (mg/dL)   HEMOGLOBIN N5A     Status: Abnormal   Collection Time   10/02/11  8:41 PM      Component Value Range Comment   Hemoglobin A1C 5.9 (*) <5.7 (%)    Mean Plasma Glucose 123 (*) <117 (mg/dL)   GLUCOSE, CAPILLARY     Status: Abnormal   Collection Time   10/02/11  9:54 PM      Component Value Range Comment   Glucose-Capillary 207 (*) 70 - 99 (mg/dL) PATIENT IDENTIFICATION ERROR. PLEASE DISREGARD RESULTS.  ACCOUNT WILL BE CREDITED.  CARDIAC PANEL(CRET KIN+CKTOT+MB+TROPI)     Status: Normal   Collection Time   10/03/11  3:07 AM      Component Value Range Comment   Total CK 88  7 - 232 (U/L)    CK, MB 1.6  0.3 - 4.0 (ng/mL)    Troponin I <0.30  <0.30 (ng/mL)    Relative Index RELATIVE INDEX IS INVALID  0.0 - 2.5    CBC     Status: Normal   Collection Time   10/03/11  3:07 AM      Component Value Range Comment   WBC 5.8  4.0 -  10.5 (K/uL)    RBC 4.44  4.22 - 5.81 (MIL/uL)    Hemoglobin 14.0  13.0 - 17.0 (g/dL)    HCT 04.5  40.9 - 81.1 (%)    MCV 92.8  78.0 - 100.0 (fL)    MCH 31.5  26.0 - 34.0 (pg)    MCHC 34.0  30.0 - 36.0 (g/dL)    RDW 91.4  78.2 - 95.6 (%)    Platelets 179  150 - 400 (K/uL)   BASIC METABOLIC PANEL     Status: Abnormal   Collection Time   10/03/11  3:07 AM      Component Value Range Comment   Sodium 137  135 - 145 (mEq/L)    Potassium 4.0  3.5 - 5.1 (mEq/L)    Chloride 101  96 - 112 (mEq/L)    CO2 28  19 - 32 (mEq/L)    Glucose, Bld 105 (*) 70 - 99 (mg/dL)    BUN 14  6 - 23 (mg/dL)    Creatinine, Ser 2.13  0.50 - 1.35 (mg/dL)    Calcium 8.9  8.4 - 10.5 (mg/dL)    GFR calc non Af Amer 80 (*) >90 (mL/min)    GFR calc Af Amer >90  >90 (mL/min)   HEPARIN LEVEL (UNFRACTIONATED)     Status: Normal   Collection Time   10/03/11  3:07 AM      Component Value Range Comment   Heparin Unfractionated 0.44  0.30 - 0.70 (IU/mL)   LIPID PANEL     Status: Normal   Collection Time   10/03/11  3:07 AM      Component Value Range Comment   Cholesterol 130  0 - 200 (mg/dL)    Triglycerides 78  <086 (mg/dL)    HDL 48  >57 (mg/dL)    Total CHOL/HDL Ratio 2.7      VLDL 16  0 - 40 (mg/dL)    LDL Cholesterol 66  0 - 99 (mg/dL)   POCT ACTIVATED CLOTTING TIME     Status: Normal   Collection Time   10/03/11 10:26 AM      Component Value Range Comment   Activated Clotting Time 122     CARDIAC PANEL(CRET KIN+CKTOT+MB+TROPI)     Status: Normal   Collection Time   10/03/11 11:16 AM      Component Value Range Comment   Total CK 74  7 - 232 (U/L)    CK, MB 1.6  0.3 - 4.0 (ng/mL)    Troponin I <0.30  <0.30 (ng/mL)    Relative Index RELATIVE INDEX IS INVALID  0.0 - 2.5      Disposition:  Follow-up Information    Follow up with Runell Gess, MD. (office will call)    Contact information:   [address] [employer] [place] [address]  [phone]          Discharge Medications:    Medication List  As of 10/04/2011  1:28 PM   TAKE these medications         acetaminophen 325 MG tablet   Commonly known as: TYLENOL   Take 2 tablets (650 mg total) by mouth every 4 (four) hours as needed.      aspirin EC 81 MG tablet   Take 81 mg by mouth at bedtime.      ezetimibe-simvastatin 10-20 MG per tablet   Commonly known as: VYTORIN   Take 1 tablet by mouth at bedtime.      fluticasone 50 MCG/ACT nasal spray   Commonly known as: FLONASE   Place 2 sprays into the nose daily.      isosorbide mononitrate 30 MG 24 hr tablet   Commonly known as: IMDUR   Take 1 tablet (30 mg total) by mouth daily.      metoprolol succinate 50 MG 24 hr tablet   Commonly known as: TOPROL-XL   Take 50 mg by mouth daily. Take with or immediately following a meal.      nitroGLYCERIN 0.4 MG SL tablet   Commonly known as: NITROSTAT   Place 0.4 mg under the tongue every 5 (five) minutes as needed. For chest pain.      pantoprazole 40 MG tablet   Commonly known as: PROTONIX   Take 1 tablet (40 mg total) by mouth daily at 12 noon.      ramipril 5 MG capsule   Commonly known as: ALTACE   Take 5 mg by mouth daily.            Outstanding Labs/Studies  Duration of Discharge Encounter: Greater than 30 minutes including physician time.  Jolene Provost PA-C 10/04/2011 1:28 PM

## 2011-11-10 HISTORY — PX: OTHER SURGICAL HISTORY: SHX169

## 2012-07-26 ENCOUNTER — Other Ambulatory Visit: Payer: Self-pay | Admitting: Orthopedic Surgery

## 2012-07-26 DIAGNOSIS — M76899 Other specified enthesopathies of unspecified lower limb, excluding foot: Secondary | ICD-10-CM

## 2012-07-26 DIAGNOSIS — IMO0002 Reserved for concepts with insufficient information to code with codable children: Secondary | ICD-10-CM

## 2012-07-26 DIAGNOSIS — M25559 Pain in unspecified hip: Secondary | ICD-10-CM

## 2012-08-05 ENCOUNTER — Ambulatory Visit
Admission: RE | Admit: 2012-08-05 | Discharge: 2012-08-05 | Disposition: A | Discharge status: Home / Self Care | Payer: 59 | Source: Ambulatory Visit | Attending: Orthopedic Surgery | Admitting: Orthopedic Surgery

## 2012-08-05 ENCOUNTER — Other Ambulatory Visit: Payer: Self-pay | Admitting: *Deleted

## 2012-08-05 DIAGNOSIS — M25559 Pain in unspecified hip: Secondary | ICD-10-CM

## 2012-08-05 DIAGNOSIS — IMO0002 Reserved for concepts with insufficient information to code with codable children: Secondary | ICD-10-CM

## 2012-08-05 DIAGNOSIS — M76899 Other specified enthesopathies of unspecified lower limb, excluding foot: Secondary | ICD-10-CM

## 2012-08-12 ENCOUNTER — Other Ambulatory Visit: Payer: Self-pay | Admitting: Medical

## 2012-12-05 ENCOUNTER — Other Ambulatory Visit (HOSPITAL_COMMUNITY): Payer: Self-pay | Admitting: Cardiovascular Disease

## 2012-12-05 DIAGNOSIS — I701 Atherosclerosis of renal artery: Secondary | ICD-10-CM

## 2012-12-05 DIAGNOSIS — I739 Peripheral vascular disease, unspecified: Secondary | ICD-10-CM

## 2012-12-10 HISTORY — PX: OTHER SURGICAL HISTORY: SHX169

## 2012-12-24 ENCOUNTER — Ambulatory Visit (HOSPITAL_COMMUNITY)
Admission: RE | Admit: 2012-12-24 | Discharge: 2012-12-24 | Disposition: A | Discharge status: Home / Self Care | Payer: 59 | Source: Ambulatory Visit | Attending: Cardiovascular Disease | Admitting: Cardiovascular Disease

## 2012-12-24 DIAGNOSIS — I701 Atherosclerosis of renal artery: Secondary | ICD-10-CM | POA: Insufficient documentation

## 2012-12-24 DIAGNOSIS — I739 Peripheral vascular disease, unspecified: Secondary | ICD-10-CM

## 2012-12-24 NOTE — Progress Notes (Signed)
Renal Duplex Completed. Maurice Park  

## 2013-04-10 ENCOUNTER — Telehealth: Payer: Self-pay | Admitting: Cardiovascular Disease

## 2013-04-10 MED ORDER — RAMIPRIL 5 MG PO CAPS: 5.0000 mg | ORAL_CAPSULE | Freq: Every day | ORAL | Status: DC

## 2013-04-10 MED ORDER — METOPROLOL SUCCINATE ER 50 MG PO TB24: 50.0000 mg | ORAL_TABLET | Freq: Every day | ORAL | Status: DC

## 2013-04-10 NOTE — Telephone Encounter (Signed)
Maurice Park is needing a prescription refill on Ramipril 5mg , Metoprolol 50mg  .. The Pharmacy is [facility] at [facility].. [phone]..   Thanks

## 2013-04-10 NOTE — Telephone Encounter (Signed)
Returned call.  Left message refills sent.

## 2013-06-02 ENCOUNTER — Ambulatory Visit
Admission: RE | Admit: 2013-06-02 | Discharge: 2013-06-02 | Disposition: A | Discharge status: Home / Self Care | Payer: 59 | Source: Ambulatory Visit | Attending: Chiropractic Medicine | Admitting: Chiropractic Medicine

## 2013-06-02 ENCOUNTER — Other Ambulatory Visit: Payer: Self-pay | Admitting: Chiropractic Medicine

## 2013-06-02 ENCOUNTER — Other Ambulatory Visit: Payer: Self-pay | Admitting: *Deleted

## 2013-06-02 DIAGNOSIS — M7542 Impingement syndrome of left shoulder: Secondary | ICD-10-CM

## 2013-06-02 MED ORDER — METOPROLOL SUCCINATE ER 50 MG PO TB24: 50.0000 mg | ORAL_TABLET | Freq: Every day | ORAL | Status: DC

## 2013-07-14 ENCOUNTER — Telehealth: Payer: Self-pay | Admitting: *Deleted

## 2013-07-14 ENCOUNTER — Other Ambulatory Visit: Payer: Self-pay | Admitting: *Deleted

## 2013-07-14 DIAGNOSIS — I2510 Atherosclerotic heart disease of native coronary artery without angina pectoris: Secondary | ICD-10-CM

## 2013-07-14 DIAGNOSIS — E782 Mixed hyperlipidemia: Secondary | ICD-10-CM

## 2013-07-14 DIAGNOSIS — I10 Essential (primary) hypertension: Secondary | ICD-10-CM

## 2013-07-14 MED ORDER — NITROGLYCERIN 0.4 MG/SPRAY TL SOLN: 1.0000 | Status: DC | PRN

## 2013-07-14 NOTE — Telephone Encounter (Signed)
Informed patient that I will put lab orders in the system. Also I will order the NTG spray per his request sent to [facility].

## 2013-07-14 NOTE — Telephone Encounter (Signed)
Pt is scheduled to see [name] next Thursday and he wants to have lab work done and asked if an order can be sent. He also misplaced his Nitro (liquid) and there is no refills on it and he would like a Rx for it.

## 2013-07-16 LAB — COMPREHENSIVE METABOLIC PANEL
ALT: 32 U/L (ref 0–53)
AST: 35 U/L (ref 0–37)
Albumin: 4.3 g/dL (ref 3.5–5.2)
Alkaline Phosphatase: 52 U/L (ref 39–117)
BUN: 14 mg/dL (ref 6–23)
CO2: 31 mEq/L (ref 19–32)
Calcium: 9.2 mg/dL (ref 8.4–10.5)
Chloride: 103 mEq/L (ref 96–112)
Creat: 1.08 mg/dL (ref 0.50–1.35)
Glucose, Bld: 96 mg/dL (ref 70–99)
Potassium: 4.6 mEq/L (ref 3.5–5.3)
Sodium: 140 mEq/L (ref 135–145)
Total Bilirubin: 0.7 mg/dL (ref 0.3–1.2)
Total Protein: 6.7 g/dL (ref 6.0–8.3)

## 2013-07-16 LAB — CBC
HCT: 45.9 % (ref 39.0–52.0)
Hemoglobin: 15.7 g/dL (ref 13.0–17.0)
MCH: 32.4 pg (ref 26.0–34.0)
MCHC: 34.2 g/dL (ref 30.0–36.0)
MCV: 94.8 fL (ref 78.0–100.0)
Platelets: 215 10*3/uL (ref 150–400)
RBC: 4.84 MIL/uL (ref 4.22–5.81)
RDW: 12.8 % (ref 11.5–15.5)
WBC: 5.4 10*3/uL (ref 4.0–10.5)

## 2013-07-16 LAB — LIPID PANEL
Cholesterol: 144 mg/dL (ref 0–200)
HDL: 46 mg/dL (ref >39)
LDL Cholesterol: 73 mg/dL (ref 0–99)
Total CHOL/HDL Ratio: 3.1 Ratio
Triglycerides: 125 mg/dL (ref <150)
VLDL: 25 mg/dL (ref 0–40)

## 2013-07-24 ENCOUNTER — Ambulatory Visit (INDEPENDENT_AMBULATORY_CARE_PROVIDER_SITE_OTHER): Payer: 59 | Admitting: Cardiovascular Disease

## 2013-07-24 ENCOUNTER — Encounter: Payer: Self-pay | Admitting: Cardiovascular Disease

## 2013-07-24 VITALS — BP 118/76 | HR 68 | Ht 64.75 in | Wt 181.0 lb

## 2013-07-24 DIAGNOSIS — I779 Disorder of arteries and arterioles, unspecified: Secondary | ICD-10-CM | POA: Insufficient documentation

## 2013-07-24 DIAGNOSIS — E785 Hyperlipidemia, unspecified: Secondary | ICD-10-CM

## 2013-07-24 DIAGNOSIS — I2510 Atherosclerotic heart disease of native coronary artery without angina pectoris: Secondary | ICD-10-CM

## 2013-07-24 DIAGNOSIS — I701 Atherosclerosis of renal artery: Secondary | ICD-10-CM | POA: Insufficient documentation

## 2013-07-24 MED ORDER — SILDENAFIL CITRATE 50 MG PO TABS: 50.0000 mg | ORAL_TABLET | Freq: Every day | ORAL | Status: DC | PRN

## 2013-07-24 NOTE — Progress Notes (Signed)
07/24/2013 [name]   [birth year]  [id number]  Primary Physician Lupita Raider, MD Primary Cardiologist: Runell Gess MD [name]   HPI:"The patient is a 57 year old, recently widowed (wife died from ALS 03-21-12) [place] male, father of 1 son [name]) who I last saw 6 months ago. He has a history of CAD and PVOD. I stented his posterolateral branch back February 21, 2000. He had moderate LAD and circumflex disease at that time. He was recatheterization in 2011 revealing 60% "in-stent restenosis" of his PLA stent which I redilated. He also had right renal artery stenosis which I stented as well. Dr. Tawanna Cooler Early performed elective right carotid endarterectomy on him in 2007 which we follow by duplex ultrasound. He developed crescendo angina and was catheterized by Dr. Daphene Jaeger December 24, 2009, revealing left main 3-vessel disease. He underwent coronary artery bypass grafting x4 December 20, 2009, with a LIMA to his LAD, a left radial to a diagonal branch, sequential vein to the PDA and PLA system. His postop course was complicated by prolonged paroxysmal atrial fibrillation which he recuperated from nicely. <BR><BR>He had a Myoview stress test performed March 22, 2010, which was nonischemic. Renal Dopplers continue to show widely patent right renal artery stent and carotid Dopplers show patent endarterectomy site. He denies chest pain or shortness of breath. He was catheterized October 02, 2010, revealing an occluded left radial graft to a diagonal branch, patent LIMA to the LAD and a patent sequential vein to the PDA and PLA with normal LV function and patent right renal artery stent. His last lipid profile was a year ago. Since I saw him 6 months ago he's remained completely stable. He is working out with a Psychologist, educational 2 days a week. His son [name] is now 46 years old. He still is mildly depressed on Zoloft regarding the loss of his wife for 25 years [place] June of 2013. He scheduled to  go on a ski trip next month.   Current Outpatient Prescriptions  Medication Sig Dispense Refill  . aspirin EC 81 MG tablet Take 81 mg by mouth at bedtime.      Marland Kitchen ezetimibe-simvastatin (VYTORIN) 10-20 MG per tablet Take 1 tablet by mouth at bedtime.      . fluticasone (FLONASE) 50 MCG/ACT nasal spray Place 2 sprays into the nose daily.      . Melatonin 1 MG CAPS Take 1 mg by mouth at bedtime.      . metoprolol succinate (TOPROL-XL) 50 MG 24 hr tablet Take 1 tablet (50 mg total) by mouth daily. Take with or immediately following a meal. Need apptointment  30 tablet  3  . nitroGLYCERIN (NITROLINGUAL) 0.4 MG/SPRAY spray Place 1 spray under the tongue every 5 (five) minutes as needed for chest pain.  12 g  3  . ramipril (ALTACE) 5 MG capsule Take 5 mg by mouth daily.      Marland Kitchen RANEXA 500 MG 12 hr tablet Take 500 mg by mouth 2 (two) times daily.       . sertraline (ZOLOFT) 50 MG tablet Take 50 mg by mouth daily.       . sildenafil (VIAGRA) 50 MG tablet Take 1 tablet (50 mg total) by mouth daily as needed for erectile dysfunction.  10 tablet  3   No current facility-administered medications for this visit.    Allergies  Allergen Reactions  . Codeine Nausea Only  . Contrast Media [Iodinated Diagnostic Agents]   . Neosporin [  Neomycin-Polymyxin-Gramicidin]     History   Social History  . Marital Status: Married    Spouse Name: N/A    Number of Children: N/A  . Years of Education: N/A   Occupational History  . Not on file.   Social History Main Topics  . Smoking status: Former Smoker    Quit date: 09/11/1989  . Smokeless tobacco: Never Used  . Alcohol Use: 1.8 oz/week    3 Cans of beer per week     Comment: last drink was in november  . Drug Use: No  . Sexual Activity: Not Currently     Comment: Right Carotid endarectomy   Other Topics Concern  . Not on file   Social History Narrative  . No narrative on file     Review of Systems: General: negative for chills, fever, night  sweats or weight changes.  Cardiovascular: negative for chest pain, dyspnea on exertion, edema, orthopnea, palpitations, paroxysmal nocturnal dyspnea or shortness of breath Dermatological: negative for rash Respiratory: negative for cough or wheezing Urologic: negative for hematuria Abdominal: negative for nausea, vomiting, diarrhea, bright red blood per rectum, melena, or hematemesis Neurologic: negative for visual changes, syncope, or dizziness All other systems reviewed and are otherwise negative except as noted above.    Blood pressure 118/76, pulse 68, height 5' 4.75" (1.645 m), weight 181 lb (82.101 kg).  General appearance: alert and no distress Neck: no adenopathy, no carotid bruit, no JVD, supple, symmetrical, trachea midline and thyroid not enlarged, symmetric, no tenderness/mass/nodules Lungs: clear to auscultation bilaterally Heart: regular rate and rhythm, S1, S2 normal, no murmur, click, rub or gallop Extremities: extremities normal, atraumatic, no cyanosis or edema  EKG low atrial rhythm at 68 without ST or T wave changes  ASSESSMENT AND PLAN:   CAD (coronary artery disease), with CABG Lima to LAD, Lt. radial artery to 1st diagnal, sequential SVG to PDA and PLA, 12/20/09 Status post posterior lateral branch intervention 02/21/00 with moderate LAD and circumflex disease at that time. He's had re\re intervention subsequent to that time and ultimately underwent coronary artery bypass grafting x4/11/11 with a LIMA to his LAD, left radial to diagonal branch, sequential vein to the PDA and PLA systems. His postop course was competent by a prolonged proximal atrial fibrillation. He had a Myoview stress test performed 03/22/10 which is nonischemic. I recatheterized in 10/02/10 revealing an occluded left  Radial graft to diagonal branch but otherwise unchanged anatomy. He is asymptomatic.  Renal artery stenosis Status post right renal artery stenting remotely with redilatation 08/01/1999  for "in-stent restenosis. We've been following his renal Dopplers annually with her last done in April suggesting that the stent was widely patent.  Carotid artery disease Status post right carotid endarterectomy performed by Dr. Tawanna Cooler Early 05/03/06 with normal Dopplers within the last year.  Hyperlipidemia, controlled On statin therapy with recent lipid profile performed 07/14/13 revealing a total cholesterol 144, LDL 73 and HDL of 46      Runell Gess MD [facility], [facility] 07/24/2013 9:01 AM

## 2013-07-24 NOTE — Assessment & Plan Note (Signed)
On statin therapy with recent lipid profile performed 07/14/13 revealing a total cholesterol 144, LDL 73 and HDL of 46

## 2013-07-24 NOTE — Assessment & Plan Note (Signed)
Status post posterior lateral branch intervention 02/21/00 with moderate LAD and circumflex disease at that time. He's had re\re intervention subsequent to that time and ultimately underwent coronary artery bypass grafting x4/11/11 with a LIMA to his LAD, left radial to diagonal branch, sequential vein to the PDA and PLA systems. His postop course was competent by a prolonged proximal atrial fibrillation. He had a Myoview stress test performed 03/22/10 which is nonischemic. I recatheterized in 10/02/10 revealing an occluded left  Radial graft to diagonal branch but otherwise unchanged anatomy. He is asymptomatic.

## 2013-07-24 NOTE — Patient Instructions (Signed)
Your physician wants you to follow-up in: 6 months with Dr Berry. You will receive a reminder letter in the mail two months in advance. If you don't receive a letter, please call our office to schedule the follow-up appointment.  

## 2013-07-24 NOTE — Assessment & Plan Note (Signed)
Status post right carotid endarterectomy performed by Dr. Tawanna Cooler Early 05/03/06 with normal Dopplers within the last year.

## 2013-07-24 NOTE — Assessment & Plan Note (Signed)
Status post right renal artery stenting remotely with redilatation 08/01/1999 for "in-stent restenosis. We've been following his renal Dopplers annually with her last done in April suggesting that the stent was widely patent.

## 2013-07-28 ENCOUNTER — Encounter: Payer: Self-pay | Admitting: *Deleted

## 2013-10-13 ENCOUNTER — Other Ambulatory Visit: Payer: Self-pay | Admitting: *Deleted

## 2013-10-13 MED ORDER — METOPROLOL SUCCINATE ER 50 MG PO TB24: 50.0000 mg | ORAL_TABLET | Freq: Every day | ORAL | Status: DC

## 2014-01-26 ENCOUNTER — Other Ambulatory Visit: Payer: Self-pay | Admitting: *Deleted

## 2014-01-26 MED ORDER — EZETIMIBE-SIMVASTATIN 10-20 MG PO TABS: 1.0000 | ORAL_TABLET | Freq: Every day | ORAL | Status: DC

## 2014-01-26 MED ORDER — RANOLAZINE ER 500 MG PO TB12
500.0000 mg | ORAL_TABLET | Freq: Two times a day (BID) | ORAL | Status: DC
Start: 2014-01-26 — End: 2014-05-01

## 2014-01-26 NOTE — Telephone Encounter (Signed)
Rx was sent to pharmacy electronically. 

## 2014-02-27 ENCOUNTER — Other Ambulatory Visit: Payer: Self-pay | Admitting: Cardiovascular Disease

## 2014-02-27 NOTE — Telephone Encounter (Signed)
Rx was sent to pharmacy electronically. 

## 2014-04-08 ENCOUNTER — Encounter (HOSPITAL_COMMUNITY): Payer: Self-pay | Admitting: Emergency Medicine

## 2014-04-08 ENCOUNTER — Emergency Department (HOSPITAL_COMMUNITY)
Admission: EM | Admit: 2014-04-08 | Discharge: 2014-04-08 | Disposition: A | Discharge status: Home / Self Care | Payer: 59 | Attending: Emergency Medicine | Admitting: Emergency Medicine

## 2014-04-08 DIAGNOSIS — M25559 Pain in unspecified hip: Secondary | ICD-10-CM | POA: Diagnosis present

## 2014-04-08 DIAGNOSIS — Z951 Presence of aortocoronary bypass graft: Secondary | ICD-10-CM | POA: Diagnosis not present

## 2014-04-08 DIAGNOSIS — Z7982 Long term (current) use of aspirin: Secondary | ICD-10-CM | POA: Diagnosis not present

## 2014-04-08 DIAGNOSIS — Z9861 Coronary angioplasty status: Secondary | ICD-10-CM | POA: Diagnosis not present

## 2014-04-08 DIAGNOSIS — E785 Hyperlipidemia, unspecified: Secondary | ICD-10-CM | POA: Diagnosis not present

## 2014-04-08 DIAGNOSIS — Z79899 Other long term (current) drug therapy: Secondary | ICD-10-CM | POA: Diagnosis not present

## 2014-04-08 DIAGNOSIS — Z87891 Personal history of nicotine dependence: Secondary | ICD-10-CM | POA: Diagnosis not present

## 2014-04-08 DIAGNOSIS — IMO0002 Reserved for concepts with insufficient information to code with codable children: Secondary | ICD-10-CM | POA: Diagnosis not present

## 2014-04-08 DIAGNOSIS — F4323 Adjustment disorder with mixed anxiety and depressed mood: Secondary | ICD-10-CM | POA: Insufficient documentation

## 2014-04-08 DIAGNOSIS — M25551 Pain in right hip: Secondary | ICD-10-CM

## 2014-04-08 DIAGNOSIS — I2510 Atherosclerotic heart disease of native coronary artery without angina pectoris: Secondary | ICD-10-CM | POA: Diagnosis not present

## 2014-04-08 MED ORDER — TRAMADOL HCL 50 MG PO TABS: 50.0000 mg | ORAL_TABLET | Freq: Four times a day (QID) | ORAL | Status: DC | PRN

## 2014-04-08 MED ORDER — DIAZEPAM 5 MG PO TABS: 5.0000 mg | ORAL_TABLET | Freq: Two times a day (BID) | ORAL | Status: DC

## 2014-04-08 NOTE — ED Provider Notes (Signed)
CSN: [id number]     Arrival date & time 04/08/14  0957 History   First MD Initiated Contact with Patient 04/08/14 1011     Chief Complaint  Patient presents with  . Hip Pain   HPI  Maurice Park is a 58 y.o. male with a PMH of CAD s/p CABG, hyperlipidemia, dysrhythemia, angina, PVD, renal artery stenosis, anxiety, and depression who presents to the ED for evaluation of hip pain.  History was provided by the patient. Patient states that he played golf 10 days ago and worked out with his Systems analyst 2 days later. No trauma or injuries. The next day on 04/01/14 he developed an intermittent right hip pain which has been gradually getting worse. His pain is located in his right hip and radiates down the lateral and posterior side of his right leg towards his calf. His pain is worse with standing up and straightening his leg as well as ambulation. Patient has been using a cane to help with this. Patient followed up with a partner at his orthopedic surgery office (his orthopedic surgeon is out of town) on 04/03/14 and again on 04/06/14. He states he had x-rays done at that time which were negative. He has an MRI scheduled for 04/23/14. Patient states that he has been stretching and using a roller to help this pain. He also has been alternating cold and ice packs. He was prescribed a prednisone dose pack which he has been taking. He also was prescribed hydrocodone which temporarily relieves his pain however this makes him sleepy. He was also prescribed Flexeril however this has not provided any relief in his pain. Patient describes a burning and sharp pain. He has a history of a ligament and bone spur surgery in his right hip in 2013 with Dr. Madelon Lips. He does not have a history of a right hip replacement. Patient denies any fever, chills, skin changes, loss of sensation, weakness, numbness, dysuria, loss of bowel or bladder function, back pain, or other concerns.   Past Medical History  Diagnosis Date  . CAD  (coronary artery disease), with CABG [place] to LAD, Lt. radial artery to 1st diagnal, sequential SVG to PDA and PLA, 12/20/09 April 2011  . Hyperlipidemia, controlled   . Situational mixed anxiety and depressive disorder,    . Angina   . Anxiety   . Peripheral vascular disease   . Dysrhythmia   . Renal artery stenosis   . Carotid artery disease     status post right carotid endarterectomy   Past Surgical History  Procedure Laterality Date  . Coronary artery bypass graft    . Coronary angioplasty     Family History  Problem Relation Age of Onset  . Hypertension Mother   . Coronary artery disease Father    History  Substance Use Topics  . Smoking status: Former Smoker    Quit date: 09/11/1989  . Smokeless tobacco: Never Used  . Alcohol Use: 1.8 oz/week    3 Cans of beer per week     Comment: last drink was in november    Review of Systems  Constitutional: Negative for fever, chills, activity change, appetite change and fatigue.  Cardiovascular: Negative for leg swelling.  Gastrointestinal: Negative for nausea and vomiting.  Musculoskeletal: Positive for arthralgias (right hip) and gait problem (due to pain). Negative for back pain, joint swelling and myalgias.  Skin: Negative for color change and wound.  Neurological: Negative for weakness and numbness.    Allergies  Codeine;  Contrast media; and Neosporin  Home Medications   Prior to Admission medications   Medication Sig Start Date End Date Taking? Authorizing Provider  aspirin EC 81 MG tablet Take 81 mg by mouth at bedtime.    Historical Provider, MD  ezetimibe-simvastatin (VYTORIN) 10-20 MG per tablet Take 1 tablet by mouth at bedtime. 01/26/14   Runell GessJonathan J Berry, MD  fluticasone (FLONASE) 50 MCG/ACT nasal spray Place 2 sprays into the nose daily.    Historical Provider, MD  Melatonin 1 MG CAPS Take 1 mg by mouth at bedtime.    Historical Provider, MD  metoprolol succinate (TOPROL-XL) 50 MG 24 hr tablet Take 1 tablet  (50 mg total) by mouth daily. Take with or immediately following a meal. Need apptointment 10/13/13   Runell GessJonathan J Berry, MD  nitroGLYCERIN (NITROLINGUAL) 0.4 MG/SPRAY spray Place 1 spray under the tongue every 5 (five) minutes as needed for chest pain. 07/14/13   Runell GessJonathan J Berry, MD  ramipril (ALTACE) 5 MG capsule TAKE (1) CAPSULE DAILY. 02/27/14   Runell GessJonathan J Berry, MD  ranolazine (RANEXA) 500 MG 12 hr tablet Take 1 tablet (500 mg total) by mouth 2 (two) times daily. 01/26/14   Runell GessJonathan J Berry, MD  sertraline (ZOLOFT) 50 MG tablet Take 50 mg by mouth daily.  07/20/13   Historical Provider, MD  sildenafil (VIAGRA) 50 MG tablet Take 1 tablet (50 mg total) by mouth daily as needed for erectile dysfunction. 07/24/13   Runell GessJonathan J Berry, MD   BP 145/86  Pulse 85  Temp(Src) 98.4 F (36.9 C) (Oral)  Resp 18  SpO2 96%  Filed Vitals:   04/08/14 1004  BP: 145/86  Pulse: 85  Temp: 98.4 F (36.9 C)  TempSrc: Oral  Resp: 18  SpO2: 96%    Physical Exam  Nursing note and vitals reviewed. Constitutional: He is oriented to person, place, and time. He appears well-developed and well-nourished. No distress.  HENT:  Head: Normocephalic and atraumatic.  Right Ear: External ear normal.  Left Ear: External ear normal.  Mouth/Throat: Oropharynx is clear and moist.  Eyes: Conjunctivae are normal. Right eye exhibits no discharge. Left eye exhibits no discharge.  Neck: Neck supple.  Cardiovascular: Normal rate, regular rhythm, normal heart sounds and intact distal pulses.  Exam reveals no gallop and no friction rub.   No murmur heard. Dorsalis pedis pulses present and equal bilaterally  Pulmonary/Chest: Effort normal and breath sounds normal. No respiratory distress. He has no wheezes. He has no rales. He exhibits no tenderness.  Abdominal: Soft. He exhibits no distension. There is no tenderness.  Musculoskeletal: Normal range of motion. He exhibits tenderness. He exhibits no edema.       Legs: Tenderness  to palpation to the right lateral hip diffusely. Patient exhibits pain when straightening his right leg and holds his right leg in a position of comfort with slight flexion. No limitations with range of motion of the right hip. No tenderness to palpation to the right knee, calf, or ankle. No tenderness to palpation to the lumbar spine or paraspinal muscles. Patient able to ambulate without difficulty or ataxia.   Neurological: He is alert and oriented to person, place, and time.  Gross sensation intact in the lower extremities throughout. Patellar reflexes intact bilaterally  Skin: Skin is warm and dry. He is not diaphoretic.  No her erythema, edema, ecchymosis, or wounds to the lower lumbar region or right hip.     ED Course  Procedures (including critical care time) Labs  Review Labs Reviewed - No data to display  Imaging Review No results found.   EKG Interpretation None      MDM   JERIMEY BURRIDGE is a 58 y.o. male with a PMH of CAD s/p CABG, hyperlipidemia, dysrhythemia, angina, PVD, renal artery stenosis, anxiety, and depression who presents to the ED for evaluation of hip pain. Hip pain likely due to to a muscular strain vs nerve pain. Patient's pain is positional with certain movements and is reproducible on exam. No history of trauma or injuries. Patient neurovascularly intact. Patient afebrile and non-toxic in appearance. No evidence of a septic joint or other infectious process. Patient has had followup with his orthopedic surgeon twice prior to his ED visit today. He has an MRI scheduled as well as a followup appointment. Patient instructed to continue to use the RICE method. Will try Ultram as a pain medication as well as Valium for a muscle relaxer. Patient previously tried Flexeril however this did not relieve his pain. Patient also was prescribed hydrocodone however this makes him drowsy. Patient is calling his orthopedic surgeon today to try to get an earlier appointment. Return  precautions, discharge instructions, and follow-up was discussed with the patient before discharge.       Discharge Medication List as of 04/08/2014 10:38 AM    START taking these medications   Details  diazepam (VALIUM) 5 MG tablet Take 1 tablet (5 mg total) by mouth 2 (two) times daily., Starting 04/08/2014, Until Discontinued, Print    traMADol (ULTRAM) 50 MG tablet Take 1 tablet (50 mg total) by mouth every 6 (six) hours as needed., Starting 04/08/2014, Until Discontinued, Print         Final impressions: 1. Right hip pain       Greer Ee Fenris Cauble PA-C           Jillyn Ledger, [place] 04/08/14 1109

## 2014-04-08 NOTE — ED Provider Notes (Signed)
Medical screening examination/treatment/procedure(s) were performed by non-physician practitioner and as supervising physician I was immediately available for consultation/collaboration.   EKG Interpretation None       Hurman HornJohn M Xue Low, MD 04/08/14 2208

## 2014-04-08 NOTE — ED Notes (Signed)
Pt states that he had hip surgery in December of 2013.  States that he played golf last Sunday, went to his personal trainer Tuesday.  Wednesday started having rt hip pain, gradually has been getting worse.  Orthopedics has scheduled him for an MRI but states that he can't wait that long.

## 2014-04-08 NOTE — Discharge Instructions (Signed)
Continue prednisone for full dose  Use RICE method - see below Try Tramadol for pain as directed - Please be careful with this medication.  It can cause drowsiness.  Use caution while driving, operating machinery, drinking alcohol, or any other activities that may impair your physical or mental abilities.   Try Valium (muscle relaxer) instead of flexeril - this may work better for you - Please be careful with this medication.  It can cause drowsiness.  Use caution while driving, operating machinery, drinking alcohol, or any other activities that may impair your physical or mental abilities.   Take Vicodin as needed for pain - Please be careful with this medication.  It can cause drowsiness.  Use caution while driving, operating machinery, drinking alcohol, or any other activities that may impair your physical or mental abilities.    Use caution combining muscle relaxer AND pain medication - they can make you drowsy  Return to the emergency department if you develop any changing/worsening condition, fever, weakness, loss of sensation or any other concerns (please read additional information regarding your condition below)     Hip Pain Your hip is the joint between your upper legs and your lower pelvis. The bones, cartilage, tendons, and muscles of your hip joint perform a lot of work each day supporting your body weight and allowing you to move around. Hip pain can range from a minor ache to severe pain in one or both of your hips. Pain may be felt on the inside of the hip joint near the groin, or the outside near the buttocks and upper thigh. You may have swelling or stiffness as well.  HOME CARE INSTRUCTIONS   Take medicines only as directed by your health care provider.  Apply ice to the injured area:  Put ice in a plastic bag.  Place a towel between your skin and the bag.  Leave the ice on for 15-20 minutes at a time, 3-4 times a day.  Keep your leg raised (elevated) when possible to  lessen swelling.  Avoid activities that cause pain.  Follow specific exercises as directed by your health care provider.  Sleep with a pillow between your legs on your most comfortable side.  Record how often you have hip pain, the location of the pain, and what it feels like. SEEK MEDICAL CARE IF:   You are unable to put weight on your leg.  Your hip is red or swollen or very tender to touch.  Your pain or swelling continues or worsens after 1 week.  You have increasing difficulty walking.  You have a fever. SEEK IMMEDIATE MEDICAL CARE IF:   You have fallen.  You have a sudden increase in pain and swelling in your hip. MAKE SURE YOU:   Understand these instructions.  Will watch your condition.  Will get help right away if you are not doing well or get worse. Document Released: 02/15/2010 Document Revised: 01/12/2014 Document Reviewed: 04/24/2013 [facility] Patient Information 2015 [place]. This information is not intended to replace advice given to you by your health care provider. Make sure you discuss any questions you have with your health care provider.  RICE: Routine Care for Injuries The routine care of many injuries includes Rest, Ice, Compression, and Elevation (RICE). HOME CARE INSTRUCTIONS  Rest is needed to allow your body to heal. Routine activities can usually be resumed when comfortable. Injured tendons and bones can take up to 6 weeks to heal. Tendons are the cord-like structures that attach  muscle to bone.  Ice following an injury helps keep the swelling down and reduces pain.  Put ice in a plastic bag.  Place a towel between your skin and the bag.  Leave the ice on for 15-20 minutes, 3-4 times a day, or as directed by your health care provider. Do this while awake, for the first 24 to 48 hours. After that, continue as directed by your caregiver.  Compression helps keep swelling down. It also gives support and helps with discomfort. If an  elastic bandage has been applied, it should be removed and reapplied every 3 to 4 hours. It should not be applied tightly, but firmly enough to keep swelling down. Watch fingers or toes for swelling, bluish discoloration, coldness, numbness, or excessive pain. If any of these problems occur, remove the bandage and reapply loosely. Contact your caregiver if these problems continue.  Elevation helps reduce swelling and decreases pain. With extremities, such as the arms, hands, legs, and feet, the injured area should be placed near or above the level of the heart, if possible. SEEK IMMEDIATE MEDICAL CARE IF:  You have persistent pain and swelling.  You develop redness, numbness, or unexpected weakness.  Your symptoms are getting worse rather than improving after several days. These symptoms may indicate that further evaluation or further X-rays are needed. Sometimes, X-rays may not show a small broken bone (fracture) until 1 week or 10 days later. Make a follow-up appointment with your caregiver. Ask when your X-ray results will be ready. Make sure you get your X-ray results. Document Released: 12/10/2000 Document Revised: 09/02/2013 Document Reviewed: 01/27/2011 [facility] Patient Information 2015 [place], [place]. This information is not intended to replace advice given to you by your health care provider. Make sure you discuss any questions you have with your health care provider.

## 2014-04-15 ENCOUNTER — Other Ambulatory Visit: Payer: Self-pay | Admitting: Orthopedic Surgery

## 2014-04-15 ENCOUNTER — Ambulatory Visit
Admission: RE | Admit: 2014-04-15 | Discharge: 2014-04-15 | Disposition: A | Discharge status: Home / Self Care | Payer: 59 | Source: Ambulatory Visit | Attending: Orthopedic Surgery | Admitting: Orthopedic Surgery

## 2014-04-15 VITALS — BP 189/100 | HR 77

## 2014-04-15 DIAGNOSIS — M79604 Pain in right leg: Secondary | ICD-10-CM

## 2014-04-15 DIAGNOSIS — M545 Low back pain, unspecified: Secondary | ICD-10-CM

## 2014-04-15 MED ORDER — IOHEXOL 180 MG/ML  SOLN
1.0000 mL | Freq: Once | INTRAMUSCULAR | Status: AC | PRN
  Administered 2014-04-15: 1 mL via EPIDURAL

## 2014-04-15 MED ORDER — DIPHENHYDRAMINE HCL 50 MG PO CAPS
50.0000 mg | ORAL_CAPSULE | Freq: Once | ORAL | Status: AC
  Administered 2014-04-15: 50 mg via ORAL

## 2014-04-15 MED ORDER — METHYLPREDNISOLONE ACETATE 40 MG/ML INJ SUSP (RADIOLOG
120.0000 mg | Freq: Once | INTRAMUSCULAR | Status: AC
Start: 2014-04-15 — End: 2014-04-15
  Administered 2014-04-15: 120 mg via EPIDURAL

## 2014-04-15 NOTE — Discharge Instructions (Signed)

## 2014-05-01 ENCOUNTER — Other Ambulatory Visit: Payer: Self-pay | Admitting: *Deleted

## 2014-05-01 ENCOUNTER — Telehealth: Payer: Self-pay | Admitting: Cardiovascular Disease

## 2014-05-01 MED ORDER — METOPROLOL SUCCINATE ER 50 MG PO TB24: 50.0000 mg | ORAL_TABLET | Freq: Every day | ORAL | Status: DC

## 2014-05-01 MED ORDER — RANOLAZINE ER 500 MG PO TB12: 500.0000 mg | ORAL_TABLET | Freq: Two times a day (BID) | ORAL | Status: DC

## 2014-05-01 MED ORDER — EZETIMIBE-SIMVASTATIN 10-20 MG PO TABS: 1.0000 | ORAL_TABLET | Freq: Every day | ORAL | Status: DC

## 2014-05-01 MED ORDER — RAMIPRIL 5 MG PO CAPS: 5.0000 mg | ORAL_CAPSULE | Freq: Every day | ORAL | Status: DC

## 2014-05-01 NOTE — Telephone Encounter (Signed)
Rx refills were switched to different pharmacy for patient. Patient notified and voiced understanding

## 2014-05-01 NOTE — Telephone Encounter (Signed)
Pt called in stating that his presciptions were sent to the wrong pharmacy and would like them now sent to [facility] instead of [facility]. Please call pt to notify them of change.  Thanks

## 2014-06-26 ENCOUNTER — Other Ambulatory Visit: Payer: Self-pay | Admitting: Cardiovascular Disease

## 2014-06-26 NOTE — Telephone Encounter (Signed)
Rx was sent to pharmacy electronically. 

## 2014-08-20 ENCOUNTER — Encounter (HOSPITAL_COMMUNITY): Payer: Self-pay | Admitting: Cardiovascular Disease

## 2014-10-29 IMAGING — CR DG SHOULDER 2+V*L*
3 series · 3 of 3 positions shown · non-contrast
Comparison: None.

CLINICAL DATA: Decreased range of motion for several months, no
trauma

EXAM:
LEFT SHOULDER - 2+ VIEW

[view not recorded (1 of 3)]
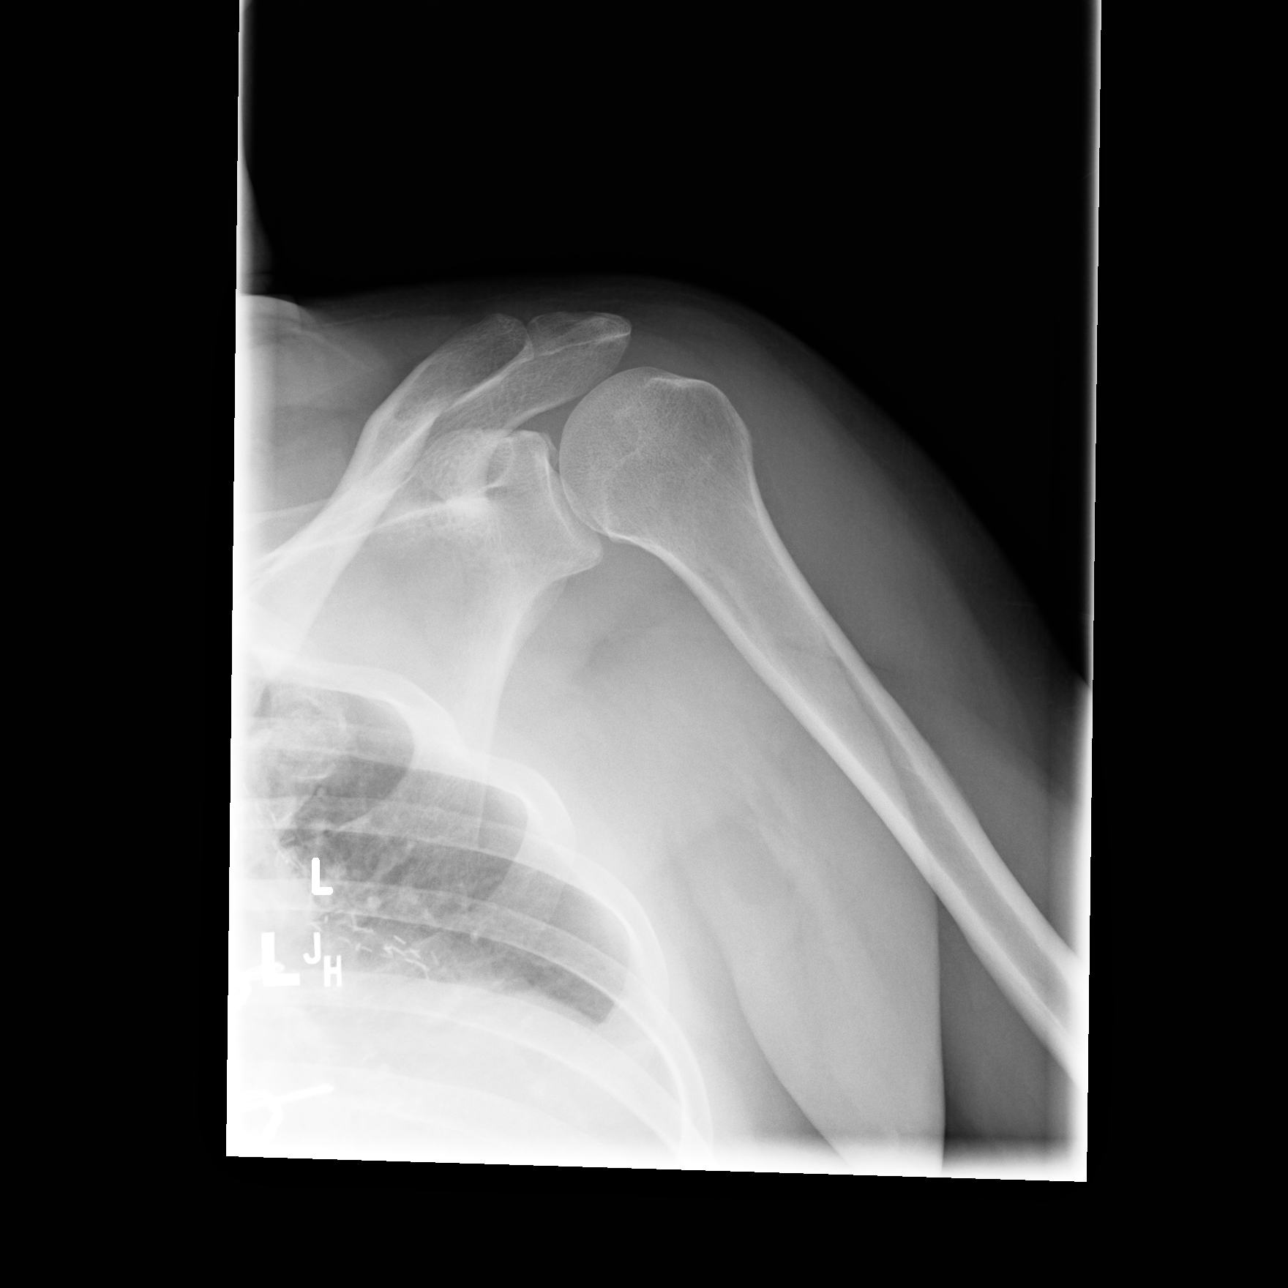

[view not recorded (2 of 3)]
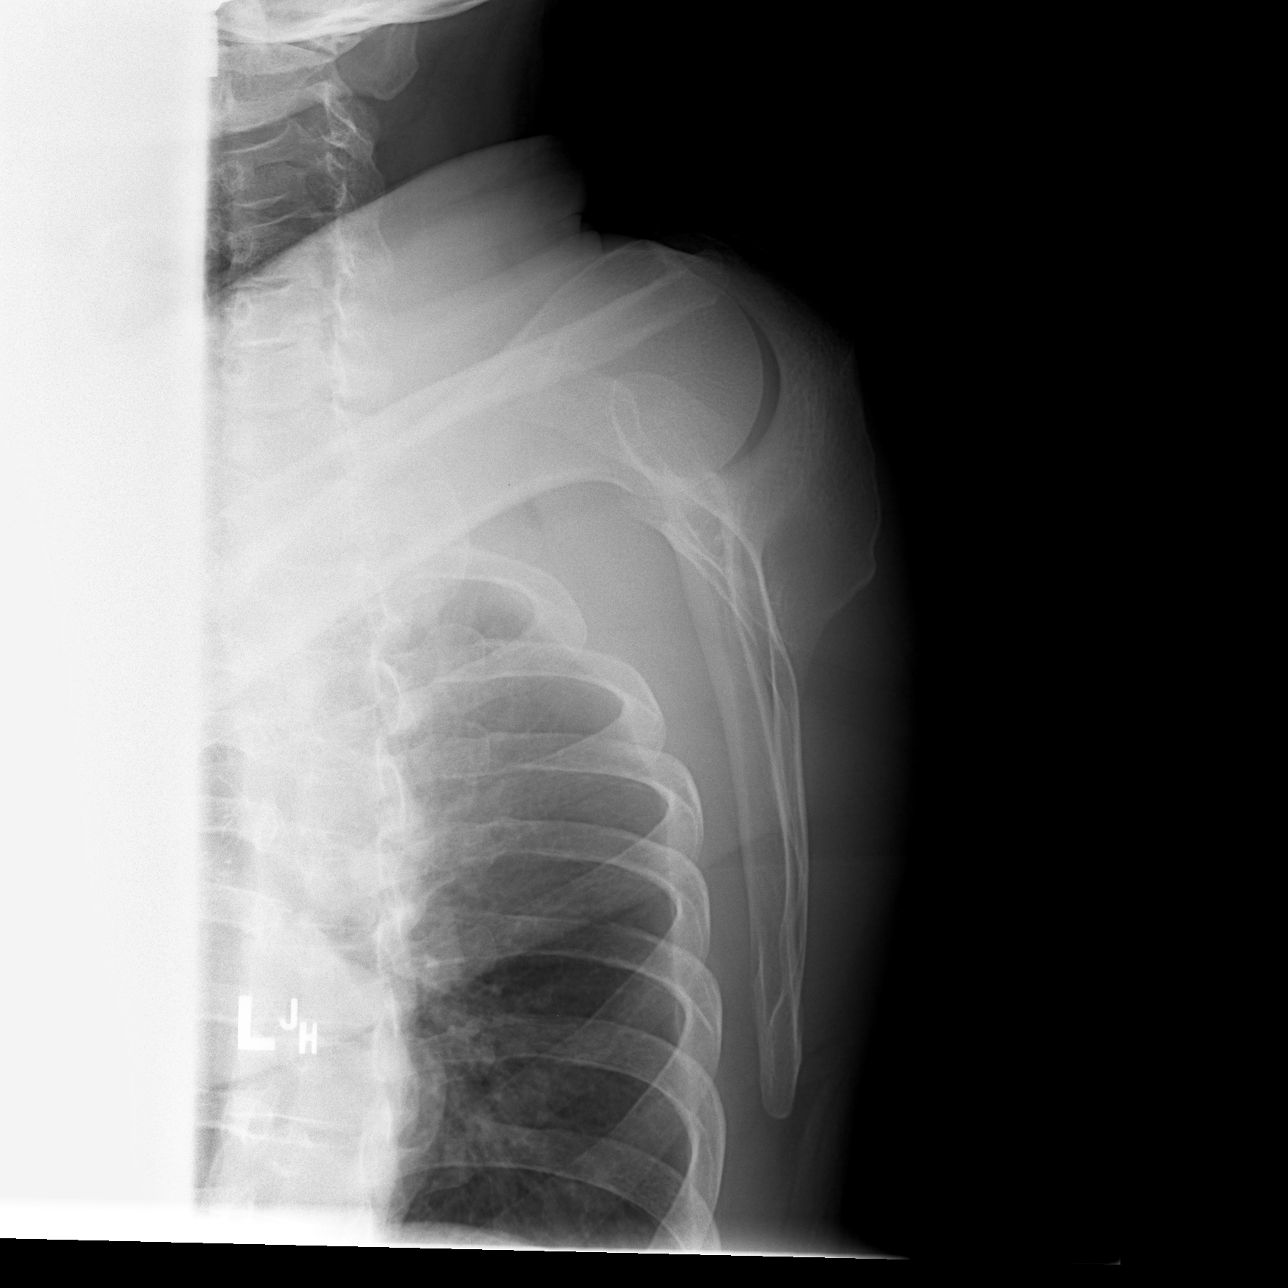

[view not recorded (3 of 3)]
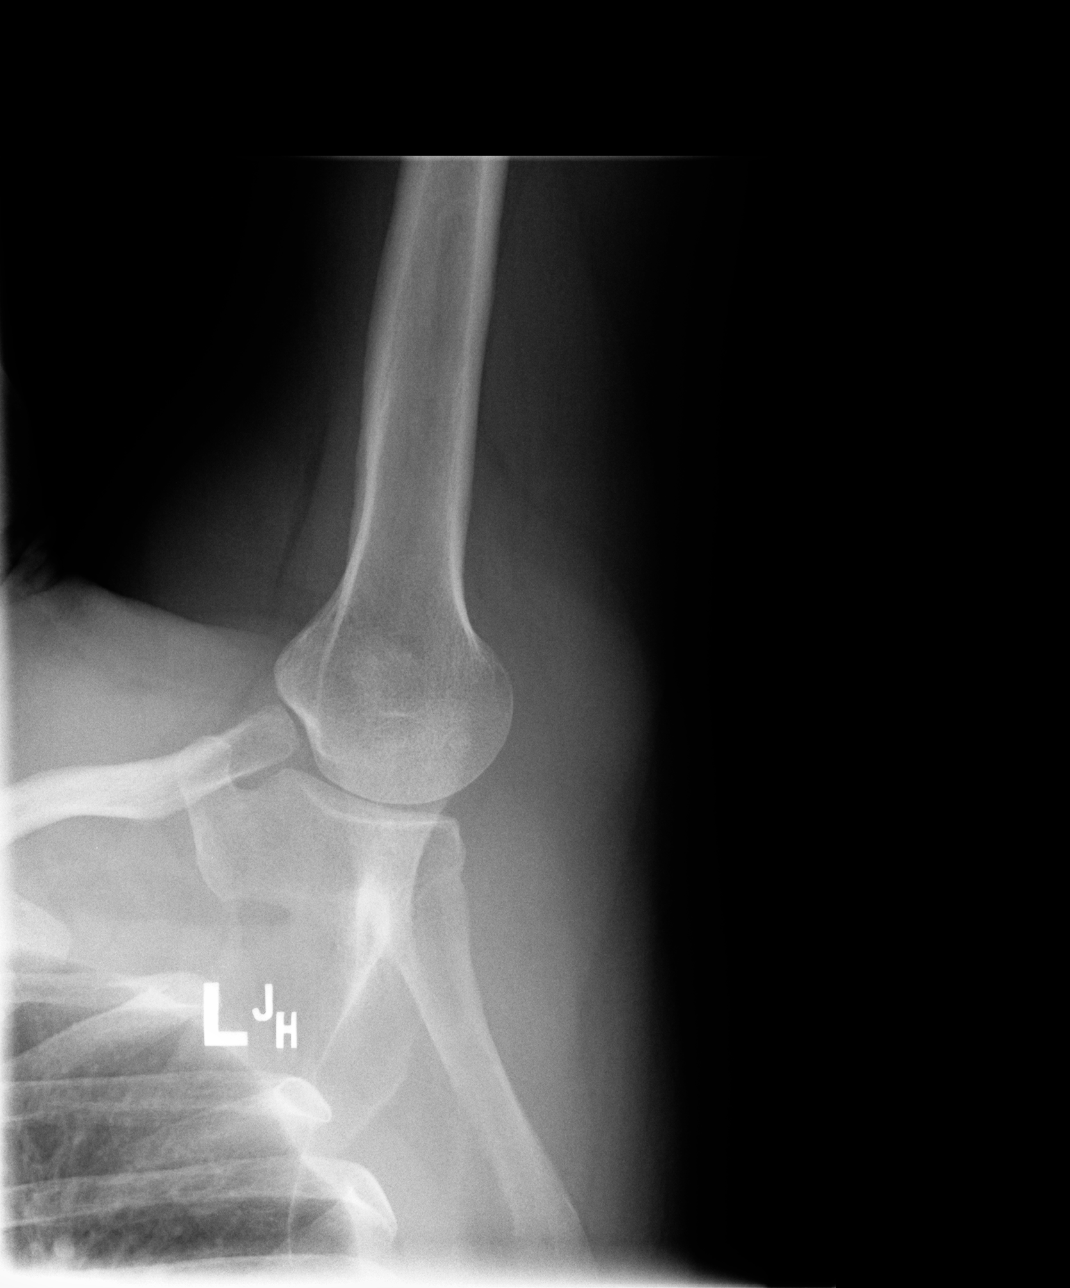

[3 of 3 positions shown; findings below may reference images not displayed]

FINDINGS: There is only slight loss of shoulder joint space. No fracture is
seen. The left AC joint is normally aligned.
IMPRESSION: Minimal degenerative change. No acute abnormality.

## 2014-10-30 ENCOUNTER — Other Ambulatory Visit: Payer: Self-pay | Admitting: Cardiovascular Disease

## 2014-10-30 NOTE — Telephone Encounter (Signed)
Rx(s) sent to pharmacy electronically.  

## 2014-11-26 ENCOUNTER — Telehealth: Payer: Self-pay | Admitting: Cardiovascular Disease

## 2014-11-30 NOTE — Telephone Encounter (Signed)
Closed encounter °

## 2014-12-03 ENCOUNTER — Encounter: Payer: Self-pay | Admitting: *Deleted

## 2014-12-10 ENCOUNTER — Other Ambulatory Visit: Payer: Self-pay | Admitting: Cardiovascular Disease

## 2014-12-10 ENCOUNTER — Telehealth: Payer: Self-pay | Admitting: Cardiovascular Disease

## 2014-12-10 MED ORDER — EZETIMIBE-SIMVASTATIN 10-20 MG PO TABS: 1.0000 | ORAL_TABLET | Freq: Every day | ORAL | Status: DC

## 2014-12-10 MED ORDER — RAMIPRIL 5 MG PO CAPS: 5.0000 mg | ORAL_CAPSULE | Freq: Every day | ORAL | Status: DC

## 2014-12-10 MED ORDER — RANOLAZINE ER 500 MG PO TB12: 500.0000 mg | ORAL_TABLET | Freq: Two times a day (BID) | ORAL | Status: DC

## 2014-12-10 NOTE — Telephone Encounter (Signed)
°  1. Which medications need to be refilled? Metoprolol,Ramipril,Vytorin and Ranolazine-changing pharmacy-new prescriptions  2. Which pharmacy is medication to be sent to?[facility]  3. Do they need a 30 day or 90 day supply? 30  4. Would they like a call back once the medication has been sent to the pharmacy? yes

## 2014-12-10 NOTE — Telephone Encounter (Signed)
Medications sent to pharmacy

## 2014-12-10 NOTE — Telephone Encounter (Signed)
Encounter close when I looked at his medicine list.Hit

## 2014-12-11 ENCOUNTER — Other Ambulatory Visit: Payer: Self-pay | Admitting: Cardiovascular Disease

## 2014-12-11 NOTE — Telephone Encounter (Signed)
Rx(s) sent to pharmacy electronically.  

## 2014-12-15 ENCOUNTER — Ambulatory Visit: Payer: Self-pay | Admitting: Cardiovascular Disease

## 2014-12-16 ENCOUNTER — Telehealth: Payer: Self-pay | Admitting: Cardiovascular Disease

## 2014-12-16 NOTE — Telephone Encounter (Signed)
Closed enocunter °

## 2014-12-28 ENCOUNTER — Other Ambulatory Visit: Payer: Self-pay | Admitting: Cardiovascular Disease

## 2014-12-28 NOTE — Telephone Encounter (Signed)
Rx refill sent to patient pharmacy   

## 2015-01-01 ENCOUNTER — Encounter: Payer: Self-pay | Admitting: Cardiovascular Disease

## 2015-01-01 ENCOUNTER — Ambulatory Visit (INDEPENDENT_AMBULATORY_CARE_PROVIDER_SITE_OTHER): Payer: 59 | Admitting: Cardiovascular Disease

## 2015-01-01 VITALS — BP 124/80 | HR 61 | Ht 64.5 in | Wt 176.0 lb

## 2015-01-01 DIAGNOSIS — Z79899 Other long term (current) drug therapy: Secondary | ICD-10-CM | POA: Diagnosis not present

## 2015-01-01 DIAGNOSIS — E785 Hyperlipidemia, unspecified: Secondary | ICD-10-CM

## 2015-01-01 DIAGNOSIS — I739 Peripheral vascular disease, unspecified: Secondary | ICD-10-CM

## 2015-01-01 DIAGNOSIS — I2510 Atherosclerotic heart disease of native coronary artery without angina pectoris: Secondary | ICD-10-CM

## 2015-01-01 DIAGNOSIS — I2583 Coronary atherosclerosis due to lipid rich plaque: Principal | ICD-10-CM

## 2015-01-01 DIAGNOSIS — I701 Atherosclerosis of renal artery: Secondary | ICD-10-CM

## 2015-01-01 NOTE — Patient Instructions (Signed)
  We will see you back in follow up in 1 year with Dr Allyson SabalBerry.   Dr Allyson SabalBerry has ordered: 1. A FASTING lipid profile: to be done at your convenience.  [facility] is [facility] lab on the first floor of this building, [address].  They are open from 8am-5pm with a lunch from 12-2.  You do not need an appointment.   2. Carotid Duplex- This test is an ultrasound of the carotid arteries in your neck. It looks at blood flow through these arteries that supply the brain with blood. Allow one hour for this exam. There are no restrictions or special instructions.  3. Renal artery duplex- During this test, an ultrasound is used to evaluate blood flow to the kidneys. Allow one hour for this exam. Do not eat after midnight the day before and avoid carbonated beverages. Take your medications as you usually do.

## 2015-01-01 NOTE — Assessment & Plan Note (Signed)
History of hyperlipidemia on Vytorin 10/20. I'm going to repeat a lipid liver profile

## 2015-01-01 NOTE — Progress Notes (Signed)
01/01/2015 [name]   1956-01-08  [id number]  Primary Physician Lupita Raider, MD Primary Cardiologist: Runell Gess MD [name]   HPI:  Maurice Park is a 59 year old, recently widowed (wife died from ALS March 17, 2012) [place] male, father of 1 son Maurice Park) who I last saw 07/24/13. He has a history of CAD and PVOD. I stented his posterolateral branch back February 21, 2000. He had moderate LAD and circumflex disease at that time. He was recatheterization in 2011 revealing 60% "in-stent restenosis" of his PLA stent which I redilated. He also had right renal artery stenosis which I stented as well. Dr. Tawanna Cooler Early performed elective right carotid endarterectomy on him in 2007 which we follow by duplex ultrasound. He developed crescendo angina and was catheterized by Dr. Daphene Jaeger December 24, 2009, revealing left main 3-vessel disease. He underwent coronary artery bypass grafting x4 December 20, 2009, with a LIMA to his LAD, a left radial to a diagonal branch, sequential vein to the PDA and PLA system. His postop course was complicated by prolonged paroxysmal atrial fibrillation which he recuperated from nicely. <BR><BR>He had a Myoview stress test performed March 22, 2010, which was nonischemic. Renal Dopplers continue to show widely patent right renal artery stent and carotid Dopplers show patent endarterectomy site. He denies chest pain or shortness of breath. He was catheterized October 02, 2010, revealing an occluded left radial graft to a diagonal branch, patent LIMA to the LAD and a patent sequential vein to the PDA and PLA with normal LV function and patent right renal artery stent. His last lipid profile was a year ago. Since I saw him 18 months ago he's remained completely stable. He is working out with a Psychologist, educational 2 days a week. His son Maurice Park is now  59 years old and has gotten into [employer]his wife of 25 years, [name], died June 20, 2013of ALS. He is very active and actually  has gone skiing with his son several times this past winter and has tried golf and tennis as well.  Current Outpatient Prescriptions  Medication Sig Dispense Refill  . aspirin EC 81 MG tablet Take 81 mg by mouth at bedtime.    Marland Kitchen ezetimibe-simvastatin (VYTORIN) 10-20 MG per tablet Take 1 tablet by mouth at bedtime. 30 tablet 1  . metoprolol succinate (TOPROL-XL) 50 MG 24 hr tablet TAKE 1 TABLET DAILY WITH OR IMMEDIATELY FOLLOWING A MEAL. 30 tablet 0  . nitroGLYCERIN (NITROLINGUAL) 0.4 MG/SPRAY spray Place 1 spray under the tongue every 5 (five) minutes as needed for chest pain. 12 g 3  . ramipril (ALTACE) 5 MG capsule Take 1 capsule (5 mg total) by mouth daily. 30 capsule 1  . ranolazine (RANEXA) 500 MG 12 hr tablet Take 1 tablet (500 mg total) by mouth 2 (two) times daily. 60 tablet 1   No current facility-administered medications for this visit.    Allergies  Allergen Reactions  . Codeine Nausea Only  . Contrast Media [Iodinated Diagnostic Agents] Hives  . Neosporin [Neomycin-Polymyxin-Gramicidin] Rash    History   Social History  . Marital Status: Married    Spouse Name: N/A  . Number of Children: 1  . Years of Education: BSBA   Occupational History  . Not on file.   Social History Main Topics  . Smoking status: Former Smoker -- 1.00 packs/day for 12 years    Types: Cigarettes    Quit date: 11/09/1981  . Smokeless tobacco: Never Used  . Alcohol  Use: 1.8 oz/week    3 Cans of beer per week     Comment: last drink was in november  . Drug Use: No  . Sexual Activity: Not Currently     Comment: Right Carotid endarectomy   Other Topics Concern  . Not on file   Social History Narrative     Review of Systems: General: negative for chills, fever, night sweats or weight changes.  Cardiovascular: negative for chest pain, dyspnea on exertion, edema, orthopnea, palpitations, paroxysmal nocturnal dyspnea or shortness of breath Dermatological: negative for rash Respiratory:  negative for cough or wheezing Urologic: negative for hematuria Abdominal: negative for nausea, vomiting, diarrhea, bright red blood per rectum, melena, or hematemesis Neurologic: negative for visual changes, syncope, or dizziness All other systems reviewed and are otherwise negative except as noted above.    Blood pressure 124/80, pulse 61, height 5' 4.5" (1.638 m), weight 176 lb (79.833 kg).  General appearance: alert and no distress Neck: no adenopathy, no carotid bruit, no JVD, supple, symmetrical, trachea midline and thyroid not enlarged, symmetric, no tenderness/mass/nodules Lungs: clear to auscultation bilaterally Heart: regular rate and rhythm, S1, S2 normal, no murmur, click, rub or gallop Extremities: extremities normal, atraumatic, no cyanosis or edema  EKG normal sinus rhythm at 61 without ST or T-wave changes. I personally reviewed this EKG  ASSESSMENT AND PLAN:   Renal artery stenosis History of renal artery stenosis status post right renal artery stenting in the past which we followed by duplex ultrasound. This was recently done 12/24/12 demonstrating wide patency. We will repeat this   Hyperlipidemia, controlled History of hyperlipidemia on Vytorin 10/20. I'm going to repeat a lipid liver profile   Carotid artery disease History of carotid artery disease status post right carotid endarterectomy performed by Dr. Arbie CookeyEarly in 2007. He has not had a duplex ultrasound in several years which I am going to repeat   CAD (coronary artery disease), with CABG [place] to LAD, Lt. radial artery to 1st diagnal, sequential SVG to PDA and PLA, 12/20/09 History of coronary artery disease status post posterior lateral branch stenting by myself 02/21/00 with moderate LAD and circumflex disease at that time. He underwent recatheterization 2011 revealing 60% "in-stent restenosis within his PLA stent which I redilated. He had catheterization performed by Dr. Norwood Levoom Kelley 12/24/09 the setting of  crescendo angina revealing three-vessel disease. He ultimately underwent coronary artery bypass grafting with a LIMA to his LAD, vein graft to the PDA and PLA . He was recatheterized 10/02/10 revealing an occluded left radial graft to diagonal branch, patent LIMA to the LAD and patent sequential vein to the PDA and PLA with normal LV function. His right renal artery stent was patent at that time as well. Since that time his remained stable without chest pain.       Runell GessJonathan J. Elizibeth Breau MD FACP,FACC,FAHA, [facility] 01/01/2015 4:24 PM

## 2015-01-01 NOTE — Assessment & Plan Note (Signed)
History of coronary artery disease status post posterior lateral branch stenting by myself 02/21/00 with moderate LAD and circumflex disease at that time. He underwent recatheterization 2011 revealing 60% "in-stent restenosis within his PLA stent which I redilated. He had catheterization performed by Dr. Norwood Levoom Kelley 12/24/09 the setting of crescendo angina revealing three-vessel disease. He ultimately underwent coronary artery bypass grafting with a LIMA to his LAD, vein graft to the PDA and PLA . He was recatheterized 10/02/10 revealing an occluded left radial graft to diagonal branch, patent LIMA to the LAD and patent sequential vein to the PDA and PLA with normal LV function. His right renal artery stent was patent at that time as well. Since that time his remained stable without chest pain.

## 2015-01-01 NOTE — Assessment & Plan Note (Signed)
History of carotid artery disease status post right carotid endarterectomy performed by Dr. Arbie CookeyEarly in 2007. He has not had a duplex ultrasound in several years which I am going to repeat

## 2015-01-01 NOTE — Assessment & Plan Note (Signed)
History of renal artery stenosis status post right renal artery stenting in the past which we followed by duplex ultrasound. This was recently done 12/24/12 demonstrating wide patency. We will repeat this

## 2015-01-19 ENCOUNTER — Ambulatory Visit (HOSPITAL_COMMUNITY)
Admission: RE | Admit: 2015-01-19 | Discharge: 2015-01-19 | Disposition: A | Discharge status: Home / Self Care | Payer: 59 | Source: Ambulatory Visit | Attending: Cardiology | Admitting: Cardiology

## 2015-01-19 DIAGNOSIS — I10 Essential (primary) hypertension: Secondary | ICD-10-CM | POA: Diagnosis not present

## 2015-01-19 DIAGNOSIS — I739 Peripheral vascular disease, unspecified: Secondary | ICD-10-CM

## 2015-01-19 DIAGNOSIS — I701 Atherosclerosis of renal artery: Secondary | ICD-10-CM | POA: Diagnosis not present

## 2015-01-19 DIAGNOSIS — I6523 Occlusion and stenosis of bilateral carotid arteries: Secondary | ICD-10-CM | POA: Insufficient documentation

## 2015-01-20 LAB — HEPATIC FUNCTION PANEL
ALT: 31 U/L (ref 0–53)
AST: 24 U/L (ref 0–37)
Albumin: 4.1 g/dL (ref 3.5–5.2)
Alkaline Phosphatase: 63 U/L (ref 39–117)
Bilirubin, Direct: 0.1 mg/dL (ref 0.0–0.3)
Indirect Bilirubin: 0.4 mg/dL (ref 0.2–1.2)
Total Bilirubin: 0.5 mg/dL (ref 0.2–1.2)
Total Protein: 6.6 g/dL (ref 6.0–8.3)

## 2015-01-20 LAB — LIPID PANEL
Cholesterol: 156 mg/dL (ref 0–200)
HDL: 48 mg/dL (ref >=40)
LDL Cholesterol: 92 mg/dL (ref 0–99)
Total CHOL/HDL Ratio: 3.3 Ratio
Triglycerides: 82 mg/dL (ref <150)
VLDL: 16 mg/dL (ref 0–40)

## 2015-01-25 ENCOUNTER — Other Ambulatory Visit: Payer: Self-pay | Admitting: Cardiovascular Disease

## 2015-01-25 NOTE — Telephone Encounter (Signed)
Rx has been sent to the pharmacy electronically. ° °

## 2015-02-10 ENCOUNTER — Telehealth: Payer: Self-pay | Admitting: Cardiovascular Disease

## 2015-02-10 NOTE — Telephone Encounter (Signed)
Pt is returning Chelley's call in regards to some test he had done on 5/10. Please call back  Thanks

## 2015-02-10 NOTE — Telephone Encounter (Signed)
Patient returned call, notified him that his renal dopplers were stable and to repeat in 1 year. Patient understood and agreed with plan.

## 2015-02-12 ENCOUNTER — Other Ambulatory Visit: Payer: Self-pay | Admitting: Cardiovascular Disease

## 2015-05-18 ENCOUNTER — Other Ambulatory Visit: Payer: Self-pay | Admitting: Cardiovascular Disease

## 2015-07-20 ENCOUNTER — Other Ambulatory Visit: Payer: Self-pay | Admitting: Cardiovascular Disease

## 2015-12-23 ENCOUNTER — Other Ambulatory Visit: Payer: Self-pay | Admitting: Cardiovascular Disease

## 2016-02-15 ENCOUNTER — Encounter: Payer: Self-pay | Admitting: Cardiovascular Disease

## 2016-02-15 ENCOUNTER — Ambulatory Visit (INDEPENDENT_AMBULATORY_CARE_PROVIDER_SITE_OTHER): Payer: 59 | Admitting: Cardiovascular Disease

## 2016-02-15 VITALS — BP 135/79 | HR 56 | Ht 65.0 in | Wt 179.6 lb

## 2016-02-15 DIAGNOSIS — Z79899 Other long term (current) drug therapy: Secondary | ICD-10-CM

## 2016-02-15 DIAGNOSIS — I701 Atherosclerosis of renal artery: Secondary | ICD-10-CM

## 2016-02-15 DIAGNOSIS — I2510 Atherosclerotic heart disease of native coronary artery without angina pectoris: Secondary | ICD-10-CM | POA: Diagnosis not present

## 2016-02-15 DIAGNOSIS — E785 Hyperlipidemia, unspecified: Secondary | ICD-10-CM

## 2016-02-15 DIAGNOSIS — I2583 Coronary atherosclerosis due to lipid rich plaque: Secondary | ICD-10-CM

## 2016-02-15 DIAGNOSIS — Z0000 Encounter for general adult medical examination without abnormal findings: Secondary | ICD-10-CM

## 2016-02-15 DIAGNOSIS — I739 Peripheral vascular disease, unspecified: Secondary | ICD-10-CM

## 2016-02-15 DIAGNOSIS — I779 Disorder of arteries and arterioles, unspecified: Secondary | ICD-10-CM

## 2016-02-15 NOTE — Assessment & Plan Note (Signed)
History of renal artery stenosis status post right renal artery stenting by myself in the past. We followed this by detox ultrasound on annual basis.

## 2016-02-15 NOTE — Assessment & Plan Note (Signed)
History of CAD status post posterior lateral branch stenting by myself 02/21/00 with moderate LAD and circumflex disease at that time. He had cardiac catheterization performed by Dr. Norwood Levoom Kelley 12/24/09 revealing left main/three-vessel disease and underwent coronary arty bypass grafting X 4 with a LIMA to his LAD, left radial to diagonal branch, sequential vein to the PDA and PLA system. His course was coupled by prolonged paroxysmal atrial fibrillation for which she recuperated nicely maintaining sinus rhythm. His last Myoview performed 03/22/10 was nonischemic. He denies chest pain or shortness of breath. He did have cardiac catheterization performed 09/30/10 revealing an occluded left radial graft to a diagonal branch, patent LIMA to the LAD and patent secreting potential vein to the PDA and PLA system with normal LV function. His right renal artery stent was patent at that time.

## 2016-02-15 NOTE — Patient Instructions (Signed)
Medication Instructions:  Your physician recommends that you continue on your current medications as directed. Please refer to the Current Medication list given to you today.   Labwork: Your physician recommends that you return for lab work AT YOUR EARLIEST CONVENIENCE- FASTING. The lab can be found on the FIRST FLOOR of out building in [address]   Testing/Procedures: none  Follow-Up: Your physician wants you to follow-up in: 12 MONTHS WITH DR Allyson SabalBERRY.  You will receive a reminder letter in the mail two months in advance. If you don't receive a letter, please call our office to schedule the follow-up appointment.   Any Other Special Instructions Will Be Listed Below (If Applicable).     If you need a refill on your cardiac medications before your next appointment, please call your pharmacy.

## 2016-02-15 NOTE — Progress Notes (Signed)
02/15/2016 [name]   02/15/1956  [id number]  Primary Physician Lupita RaiderSHAW,KIMBERLEE, MD Primary Cardiologist: Runell GessJonathan J. Nicolena Schurman MD Roseanne RenoFACP,FACC,FAHA, FSCAI   HPI:  Maurice Park is a 60 year old, widowed (wife died from ALS February 28, 2012) [place] male, father of 1 son Maurice Cower(Jason) who I last saw 01/01/15. He is accompanied today by his new wife Maurice Park.He has a history of CAD and PVOD. I stented his posterolateral branch back February 21, 2000. He had moderate LAD and circumflex disease at that time. He was recatheterization in 2011 revealing 60% "in-stent restenosis" of his PLA stent which I redilated. He also had right renal artery stenosis which I stented as well. Dr. Tawanna Coolerodd Early performed elective right carotid endarterectomy on him in 2007 which we follow by duplex ultrasound. He developed crescendo angina and was catheterized by Dr. Daphene Jaegerom Kelly December 24, 2009, revealing left main 3-vessel disease. He underwent coronary artery bypass grafting x4 December 20, 2009, with a LIMA to his LAD, a left radial to a diagonal branch, sequential vein to the PDA and PLA system. His postop course was complicated by prolonged paroxysmal atrial fibrillation which he recuperated from nicely. <BR><BR>He had a Myoview stress test performed March 22, 2010, which was nonischemic. Renal Dopplers continue to show widely patent right renal artery stent and carotid Dopplers show patent endarterectomy site. He denies chest pain or shortness of breath. He was catheterized October 02, 2010, revealing an occluded left radial graft to a diagonal branch, patent LIMA to the LAD and a patent sequential vein to the PDA and PLA with normal LV function and patent right renal artery stent. His last lipid profile was a year ago. Since I saw him 18 months ago he's remained completely stable. He is working out with a Psychologist, educationaltrainer 2 days a week. His son Maurice CowerJason is now 59 years old and has gotten into [employer]..his wife of 25 years, Clarisse GougeBridget, died June 2013 of  ALS. He is very active and actually has gone skiing with his son several times this past winter and has tried golf and tennis as well.   Current Outpatient Prescriptions  Medication Sig Dispense Refill  . aspirin EC 81 MG tablet Take 81 mg by mouth at bedtime.    . metoprolol succinate (TOPROL-XL) 50 MG 24 hr tablet TAKE 1 TABLET DAILY WITH OR IMMEDIATELY FOLLOWING A MEAL. 30 tablet 3  . nitroGLYCERIN (NITROLINGUAL) 0.4 MG/SPRAY spray USE 1 SPRAY UNDER THE TONGUE EVERY 5 MINUTES UP TO 3 TIMES AS NEEDED FOR CHEST PAIN. 4.9 g 5  . ramipril (ALTACE) 5 MG capsule TAKE (1) CAPSULE DAILY. 30 capsule 11  . RANEXA 500 MG 12 hr tablet TAKE 1 TABLET TWICE DAILY. 60 tablet 8  . VYTORIN 10-20 MG per tablet TAKE 1 TABLET EACH DAY. 30 tablet 11   No current facility-administered medications for this visit.    Allergies  Allergen Reactions  . Hydrocortisone Hives  . Codeine Nausea Only  . Contrast Media [Iodinated Diagnostic Agents] Hives  . Neosporin [Neomycin-Polymyxin-Gramicidin] Rash    Social History   Social History  . Marital Status: Married    Spouse Name: N/A  . Number of Children: 1  . Years of Education: BSBA   Occupational History  . Not on file.   Social History Main Topics  . Smoking status: Former Smoker -- 1.00 packs/day for 12 years    Types: Cigarettes    Quit date: 11/09/1981  . Smokeless tobacco: Never Used  . Alcohol Use:  1.8 oz/week    3 Cans of beer per week     Comment: last drink was in november  . Drug Use: No  . Sexual Activity: Not Currently     Comment: Right Carotid endarectomy   Other Topics Concern  . Not on file   Social History Narrative     Review of Systems: General: negative for chills, fever, night sweats or weight changes.  Cardiovascular: negative for chest pain, dyspnea on exertion, edema, orthopnea, palpitations, paroxysmal nocturnal dyspnea or shortness of breath Dermatological: negative for rash Respiratory: negative for cough or  wheezing Urologic: negative for hematuria Abdominal: negative for nausea, vomiting, diarrhea, bright red blood per rectum, melena, or hematemesis Neurologic: negative for visual changes, syncope, or dizziness All other systems reviewed and are otherwise negative except as noted above.    Blood pressure 135/79, pulse 56, height  (1.651 m), weight 179 lb 9.6 oz (81.466 kg).  General appearance: alert and no distress Neck: no adenopathy, no carotid bruit, no JVD, supple, symmetrical, trachea midline and thyroid not enlarged, symmetric, no tenderness/mass/nodules Lungs: clear to auscultation bilaterally Heart: regular rate and rhythm, S1, S2 normal, no murmur, click, rub or gallop Extremities: extremities normal, atraumatic, no cyanosis or edema  EKG sinus bradycardia 56 without ST or T-wave changes. I personally reviewed this EKG  ASSESSMENT AND PLAN:   CAD (coronary artery disease), with CABG [place] to LAD, Lt. radial artery to 1st diagnal, sequential SVG to PDA and PLA, 12/20/09 History of CAD status post posterior lateral branch stenting by myself 02/21/00 with moderate LAD and circumflex disease at that time. He had cardiac catheterization performed by Dr. Norwood Levo 12/24/09 revealing left main/three-vessel disease and underwent coronary arty bypass grafting X 4 with a LIMA to his LAD, left radial to diagonal branch, sequential vein to the PDA and PLA system. His course was coupled by prolonged paroxysmal atrial fibrillation for which she recuperated nicely maintaining sinus rhythm. His last Myoview performed 03/22/10 was nonischemic. He denies chest pain or shortness of breath. He did have cardiac catheterization performed 09/30/10 revealing an occluded left radial graft to a diagonal branch, patent LIMA to the LAD and patent secreting potential vein to the PDA and PLA system with normal LV function. His right renal artery stent was patent at that time.  Hyperlipidemia, controlled History of  hyperlipidemia on statin therapy. We will recheck a lipid and liver profile  Renal artery stenosis History of renal artery stenosis status post right renal artery stenting by myself in the past. We followed this by detox ultrasound on annual basis.  Carotid artery disease History of carotid artery disease status post elective right carotid endarterectomy performed by Dr. Arbie Cookey in 2007. We follow him by duplex ultrasound on an annual basis.      Runell Gess MD FACP,FACC,FAHA, [facility] 02/15/2016 8:32 AM

## 2016-02-15 NOTE — Assessment & Plan Note (Signed)
History of hyperlipidemia on statin therapy. We will recheck a lipid and liver profile 

## 2016-02-15 NOTE — Assessment & Plan Note (Signed)
History of carotid artery disease status post elective right carotid endarterectomy performed by Dr. Arbie CookeyEarly in 2007. We follow him by duplex ultrasound on an annual basis.

## 2016-02-23 ENCOUNTER — Other Ambulatory Visit: Payer: Self-pay | Admitting: Cardiovascular Disease

## 2016-02-24 LAB — LIPID PANEL
Cholesterol: 133 mg/dL (ref 125–200)
HDL: 48 mg/dL (ref >=40)
LDL Cholesterol: 68 mg/dL (ref <130)
Total CHOL/HDL Ratio: 2.8 Ratio (ref <=5.0)
Triglycerides: 84 mg/dL (ref <150)
VLDL: 17 mg/dL (ref <30)

## 2016-02-24 LAB — HEPATIC FUNCTION PANEL
ALT: 23 U/L (ref 9–46)
AST: 24 U/L (ref 10–35)
Albumin: 4.2 g/dL (ref 3.6–5.1)
Alkaline Phosphatase: 59 U/L (ref 40–115)
Bilirubin, Direct: 0.1 mg/dL (ref <=0.2)
Indirect Bilirubin: 0.6 mg/dL (ref 0.2–1.2)
Total Bilirubin: 0.7 mg/dL (ref 0.2–1.2)
Total Protein: 6.6 g/dL (ref 6.1–8.1)

## 2016-03-31 ENCOUNTER — Encounter (HOSPITAL_COMMUNITY): Payer: 59

## 2016-04-04 ENCOUNTER — Other Ambulatory Visit: Payer: Self-pay | Admitting: Cardiovascular Disease

## 2016-04-04 DIAGNOSIS — I701 Atherosclerosis of renal artery: Secondary | ICD-10-CM

## 2016-04-04 DIAGNOSIS — I6523 Occlusion and stenosis of bilateral carotid arteries: Secondary | ICD-10-CM

## 2016-04-10 ENCOUNTER — Ambulatory Visit (HOSPITAL_COMMUNITY)
Admission: RE | Admit: 2016-04-10 | Discharge: 2016-04-10 | Disposition: A | Discharge status: Home / Self Care | Payer: 59 | Source: Ambulatory Visit | Attending: Internal Medicine | Admitting: Internal Medicine

## 2016-04-10 DIAGNOSIS — I2510 Atherosclerotic heart disease of native coronary artery without angina pectoris: Secondary | ICD-10-CM | POA: Insufficient documentation

## 2016-04-10 DIAGNOSIS — F419 Anxiety disorder, unspecified: Secondary | ICD-10-CM | POA: Diagnosis not present

## 2016-04-10 DIAGNOSIS — I6523 Occlusion and stenosis of bilateral carotid arteries: Secondary | ICD-10-CM

## 2016-04-10 DIAGNOSIS — F329 Major depressive disorder, single episode, unspecified: Secondary | ICD-10-CM | POA: Insufficient documentation

## 2016-04-10 DIAGNOSIS — I701 Atherosclerosis of renal artery: Secondary | ICD-10-CM | POA: Diagnosis not present

## 2016-04-10 DIAGNOSIS — E785 Hyperlipidemia, unspecified: Secondary | ICD-10-CM | POA: Diagnosis not present

## 2016-04-12 ENCOUNTER — Other Ambulatory Visit: Payer: Self-pay | Admitting: *Deleted

## 2016-04-12 DIAGNOSIS — I739 Peripheral vascular disease, unspecified: Principal | ICD-10-CM

## 2016-04-12 DIAGNOSIS — I779 Disorder of arteries and arterioles, unspecified: Secondary | ICD-10-CM

## 2016-05-02 ENCOUNTER — Other Ambulatory Visit: Payer: Self-pay | Admitting: Cardiovascular Disease

## 2016-05-02 NOTE — Telephone Encounter (Signed)
Rx(s) sent to pharmacy electronically.  

## 2016-10-04 DIAGNOSIS — M25551 Pain in right hip: Secondary | ICD-10-CM | POA: Diagnosis not present

## 2016-10-23 DIAGNOSIS — Z719 Counseling, unspecified: Secondary | ICD-10-CM | POA: Diagnosis not present

## 2016-10-30 DIAGNOSIS — Z719 Counseling, unspecified: Secondary | ICD-10-CM | POA: Diagnosis not present

## 2016-11-01 DIAGNOSIS — M25551 Pain in right hip: Secondary | ICD-10-CM | POA: Diagnosis not present

## 2016-11-06 DIAGNOSIS — Z719 Counseling, unspecified: Secondary | ICD-10-CM | POA: Diagnosis not present

## 2016-11-08 DIAGNOSIS — M25551 Pain in right hip: Secondary | ICD-10-CM | POA: Diagnosis not present

## 2016-11-13 DIAGNOSIS — Z719 Counseling, unspecified: Secondary | ICD-10-CM | POA: Diagnosis not present

## 2016-11-22 DIAGNOSIS — M25551 Pain in right hip: Secondary | ICD-10-CM | POA: Diagnosis not present

## 2016-11-27 DIAGNOSIS — Z719 Counseling, unspecified: Secondary | ICD-10-CM | POA: Diagnosis not present

## 2016-12-04 DIAGNOSIS — Z719 Counseling, unspecified: Secondary | ICD-10-CM | POA: Diagnosis not present

## 2016-12-06 ENCOUNTER — Other Ambulatory Visit: Payer: Self-pay | Admitting: Cardiovascular Disease

## 2016-12-06 DIAGNOSIS — M25551 Pain in right hip: Secondary | ICD-10-CM | POA: Diagnosis not present

## 2016-12-11 DIAGNOSIS — Z719 Counseling, unspecified: Secondary | ICD-10-CM | POA: Diagnosis not present

## 2016-12-15 ENCOUNTER — Telehealth: Payer: Self-pay | Admitting: Cardiovascular Disease

## 2016-12-15 DIAGNOSIS — I701 Atherosclerosis of renal artery: Secondary | ICD-10-CM

## 2016-12-15 NOTE — Telephone Encounter (Signed)
Patient walked in with complaints of elevated BP He reports last night he had a headache and he checked his BP w/wrist cuff and BP was 240/110.  He states he checked his BP about 20 times between last night and this AM when he came by the office This AM, his BP was still over 200 systolic per his cuff and 170/110 when he left home.  He reports BP is normally 140s/80s but he does not check BP consistently at home.  Patient checked his BP with his wrist cuff = 216/94 HR = 54 Patient's BP was checked by RN = 157/87 HR = 54  Advised that patient purchase Omron BP cuff or arm cuff + log BP readings He was offered first available appt w/B. Sharol Harness, [place] on 4/9 but he prefers to see [name]  Patient scheduled to see [name] 01/16/17

## 2016-12-18 DIAGNOSIS — Z719 Counseling, unspecified: Secondary | ICD-10-CM | POA: Diagnosis not present

## 2016-12-20 DIAGNOSIS — M25551 Pain in right hip: Secondary | ICD-10-CM | POA: Diagnosis not present

## 2016-12-20 NOTE — Addendum Note (Signed)
Addended by: Lindell Spar on: 12/20/2016 04:02 PM   Modules accepted: Orders

## 2016-12-20 NOTE — Telephone Encounter (Signed)
Mr. Bultman will need renal Doppler studies prior to see me in the office

## 2016-12-20 NOTE — Telephone Encounter (Signed)
Patient notified of MD recommendation to have renal doppler. Patient agrees w/plan. Test ordered and message sent to scheduler to arrange.

## 2016-12-25 DIAGNOSIS — Z719 Counseling, unspecified: Secondary | ICD-10-CM | POA: Diagnosis not present

## 2017-01-01 DIAGNOSIS — Z719 Counseling, unspecified: Secondary | ICD-10-CM | POA: Diagnosis not present

## 2017-01-08 ENCOUNTER — Telehealth: Payer: Self-pay | Admitting: Cardiovascular Disease

## 2017-01-08 NOTE — Telephone Encounter (Signed)
New Message      *STAT* If patient is at the pharmacy, call can be transferred to refill team.   1. Which medications need to be refilled? (please list name of each medication and dose if known) nitro (pill) form not spray  2. Which pharmacy/location (including street and city if local pharmacy) is medication to be sent to? [facility]   3. Do they need a 30 day or 90 day supply? 90   His pharmacy is no longer carrying the nitro spray , he would like to go back to pills

## 2017-01-08 NOTE — Telephone Encounter (Signed)
Please advise if OK to write for nitro SL for patient.   No acute symptoms, this is for a change from nitro spray which is no longer being carried by his preferred pharmacy.

## 2017-01-10 DIAGNOSIS — M25551 Pain in right hip: Secondary | ICD-10-CM | POA: Diagnosis not present

## 2017-01-10 MED ORDER — NITROGLYCERIN 0.4 MG SL SUBL: 0.4000 mg | SUBLINGUAL_TABLET | SUBLINGUAL | 3 refills | Status: DC | PRN

## 2017-01-10 NOTE — Telephone Encounter (Signed)
That is fine with me. Okay to write him a prescription for this.

## 2017-01-10 NOTE — Telephone Encounter (Signed)
Spoke with patient and he stated that the NTG spray is difficult to find in pharmacy and more expensive. Patient would like to go back to the NTG tablets that he used to have. Stated he had not used either in a long time Rx for NTG tablets sent to [facility] as requested

## 2017-01-10 NOTE — Telephone Encounter (Signed)
Pt called and stated he still haven't received his prescription for nitro pills. Please call pt to advise.

## 2017-01-10 NOTE — Telephone Encounter (Signed)
Pt.notified

## 2017-01-10 NOTE — Telephone Encounter (Signed)
[id number] RX sent to pharmacy

## 2017-01-15 DIAGNOSIS — Z719 Counseling, unspecified: Secondary | ICD-10-CM | POA: Diagnosis not present

## 2017-01-16 ENCOUNTER — Encounter: Payer: Self-pay | Admitting: Cardiovascular Disease

## 2017-01-16 ENCOUNTER — Ambulatory Visit (INDEPENDENT_AMBULATORY_CARE_PROVIDER_SITE_OTHER): Payer: 59 | Admitting: Cardiovascular Disease

## 2017-01-16 ENCOUNTER — Ambulatory Visit (HOSPITAL_COMMUNITY)
Admission: RE | Admit: 2017-01-16 | Discharge: 2017-01-16 | Disposition: A | Discharge status: Home / Self Care | Payer: 59 | Source: Ambulatory Visit | Attending: Cardiology | Admitting: Cardiology

## 2017-01-16 VITALS — BP 148/84 | HR 48 | Ht 65.0 in | Wt 176.8 lb

## 2017-01-16 DIAGNOSIS — I739 Peripheral vascular disease, unspecified: Secondary | ICD-10-CM

## 2017-01-16 DIAGNOSIS — Z79899 Other long term (current) drug therapy: Secondary | ICD-10-CM | POA: Diagnosis not present

## 2017-01-16 DIAGNOSIS — I2510 Atherosclerotic heart disease of native coronary artery without angina pectoris: Secondary | ICD-10-CM

## 2017-01-16 DIAGNOSIS — I701 Atherosclerosis of renal artery: Secondary | ICD-10-CM | POA: Diagnosis not present

## 2017-01-16 DIAGNOSIS — E785 Hyperlipidemia, unspecified: Secondary | ICD-10-CM

## 2017-01-16 DIAGNOSIS — I779 Disorder of arteries and arterioles, unspecified: Secondary | ICD-10-CM

## 2017-01-16 DIAGNOSIS — E7800 Pure hypercholesterolemia, unspecified: Secondary | ICD-10-CM | POA: Diagnosis not present

## 2017-01-16 NOTE — Assessment & Plan Note (Signed)
History of renal artery stenosis status post right renal artery stenting which I performed and followed by duplex ultrasound.

## 2017-01-16 NOTE — Assessment & Plan Note (Signed)
History of carotid artery disease status post right carotid endarterectomy 2007 performed by Dr. Gretta Beganodd  Early . His last Dopplers were 04/10/16 revealing a widely patent endarterectomy site. We will continue to follow him on an annual basis.

## 2017-01-16 NOTE — Assessment & Plan Note (Signed)
History of hyperlipidemia on simvastatin and Zetia. We will recheck a lipid profile..Marland Kitchen

## 2017-01-16 NOTE — Progress Notes (Signed)
01/16/2017 Maurice Park   1956/07/15  [id number]  Primary Physician Maurice Raider, MD Primary Cardiologist: Maurice Gess MD Maurice [place], [place]  HPI:  Maurice Park is a 60 year old, widowed (wife died from ALS 2012/02/29) [place] male, father of 1 son Maurice Park) who I last saw 02/15/16. He is accompanied today by his new wife Maurice Park.He has a history of CAD and PVOD. I stented his posterolateral branch back February 21, 2000. He had moderate LAD and circumflex disease at that time. He was recatheterization in 2011 revealing 60% "in-stent restenosis" of his PLA stent which I redilated. He also had right renal artery stenosis which I stented as well. Dr. Tawanna Cooler Park performed elective right carotid endarterectomy on him in 2007 which we follow by duplex ultrasound. He developed crescendo angina and was catheterized by Dr. Daphene Park December 24, 2009, revealing left main 3-vessel disease. He underwent coronary artery bypass grafting x4 December 20, 2009, with a LIMA to his LAD, a left radial to a diagonal branch, sequential vein to the PDA and PLA system. His postop course was complicated by prolonged paroxysmal atrial fibrillation which he recuperated from nicely. <BR><BR>He had a Myoview stress test performed March 22, 2010, which was nonischemic. Renal Dopplers continue to show widely patent right renal artery stent and carotid Dopplers show patent endarterectomy site. He denies chest pain or shortness of breath. He was catheterized October 02, 2010, revealing an occluded left radial graft to a diagonal branch, patent LIMA to the LAD and a patent sequential vein to the PDA and PLA with normal LV function and patent right renal artery stent. His last lipid profile was a year ago. Since I saw him 18 months ago he's remained completely stable. He is working out with a Psychologist, educational 2 days a week. His son Maurice Park is now 73  years old and has gotten into [employer]his wife of 25 years, Maurice Park, died 10-[name]-2013of  ALS. He remarried to his current wife Maurice Park on 01/29/16. He is very active and plays tennis twice a week and actually skis as well.  Current Outpatient Prescriptions  Medication Sig Dispense Refill  . aspirin EC 81 MG tablet Take 81 mg by mouth at bedtime.    Marland Kitchen ezetimibe-simvastatin (VYTORIN) 10-20 MG tablet Take 1 tablet by mouth daily. 90 tablet 3  . metoprolol succinate (TOPROL-XL) 50 MG 24 hr tablet TAKE 1 TABLET DAILY WITH OR IMMEDIATELY FOLLOWING A MEAL. 30 tablet 3  . nitroGLYCERIN (NITROSTAT) 0.4 MG SL tablet Place 1 tablet (0.4 mg total) under the tongue every 5 (five) minutes as needed for chest pain. 100 tablet 3  . ramipril (ALTACE) 5 MG capsule Take 1 capsule (5 mg total) by mouth daily. 90 capsule 3  . RANEXA 500 MG 12 hr tablet TAKE 1 TABLET TWICE DAILY. 60 tablet 3  . VIAGRA 100 MG tablet Take 1 tablet by mouth as directed.     No current facility-administered medications for this visit.     Allergies  Allergen Reactions  . Hydrocortisone Hives  . Codeine Nausea Only  . Contrast Media [Iodinated Diagnostic Agents] Hives  . Neosporin [Neomycin-Polymyxin-Gramicidin] Rash    Social History   Social History  . Marital status: Married    Spouse name: N/A  . Number of children: 1  . Years of education: BSBA   Occupational History  . Not on file.   Social History Main Topics  . Smoking status: Former Smoker  Packs/day: 1.00    Years: 12.00    Types: Cigarettes    Quit date: 11/09/1981  . Smokeless tobacco: Never Used  . Alcohol use 1.8 oz/week    3 Cans of beer per week     Comment: last drink was in november  . Drug use: No  . Sexual activity: Not Currently     Comment: Right Carotid endarectomy   Other Topics Concern  . Not on file   Social History Narrative  . No narrative on file     Review of Systems: General: negative for chills, fever, night sweats or weight changes.  Cardiovascular: negative for chest pain, dyspnea on exertion, edema, orthopnea,  palpitations, paroxysmal nocturnal dyspnea or shortness of breath Dermatological: negative for rash Respiratory: negative for cough or wheezing Urologic: negative for hematuria Abdominal: negative for nausea, vomiting, diarrhea, bright red blood per rectum, melena, or hematemesis Neurologic: negative for visual changes, syncope, or dizziness All other systems reviewed and are otherwise negative except as noted above.    Blood pressure (!) 148/84, pulse (!) 48, height 5\' 5"  (1.651 m), weight 176 lb 12.8 oz (80.2 kg).  General appearance: alert and no distress Neck: no adenopathy, no carotid bruit, no JVD, supple, symmetrical, trachea midline and thyroid not enlarged, symmetric, no tenderness/mass/nodules Lungs: clear to auscultation bilaterally Heart: regular rate and rhythm, S1, S2 normal, no murmur, click, rub or gallop Extremities: extremities normal, atraumatic, no cyanosis or edema  EKG sinus bradycardia 48 without ST or T-wave changes. I personally reviewed this EKG  ASSESSMENT AND PLAN:   CAD (coronary artery disease), with CABG [place] to LAD, Lt. radial artery to 1st diagnal, sequential SVG to PDA and PLA, 12/20/09 History of CAD status post posterior lateral branch myocardial infarction with PCI and stenting by myself/12/01. HEENT moderate LAD and circumflex disease at that time. His recatheterized in 2011 revealing 60% in-stent restenosis of his PLA stent which I redilated. Because of crescendo angina he underwent cardiac catheterization by Dr. Tresa EndoKelly 12/22/09 revealing left main/three-vessel disease and underwent coronary artery bypass grafting 4 on 12/20/2009 with a LIMA to LAD, left radial to diagonal branch and sequential vein to the PDA and PLA system. He was recatheterized 1/22 2/12 revealing occluded left radial graft to diagonal branch, patent LIMA to the LAD are patent sequential vein to the PDA/PLA system with normal LV function. He is very active and denies chest pain or  shortness of breath.  Hyperlipidemia, controlled History of hyperlipidemia on simvastatin and Zetia. We will recheck a lipid profile..  Renal artery stenosis History of renal artery stenosis status post right renal artery stenting which I performed and followed by duplex ultrasound.  Carotid artery disease History of carotid artery disease status post right carotid endarterectomy 2007 performed by Dr. Gretta Beganodd  Park . His last Dopplers were 04/10/16 revealing a widely patent endarterectomy site. We will continue to follow him on an annual basis.      Maurice GessJonathan J. Sierra Bissonette MD FACP,FACC,FAHA, [facility] 01/16/2017 9:34 AM

## 2017-01-16 NOTE — Patient Instructions (Signed)
Medication Instructions: Dr Allyson SabalBerry recommends that you continue on your current medications as directed. Please refer to the Current Medication list given to you today.  Labwork: Your physician recommends that you return for lab work TODAY.  Testing/Procedures: 1. Carotid Doppler - Please schedule this for late July/early August 2018!  Your physician has requested that you have a carotid duplex. This test is an ultrasound of the carotid arteries in your neck. It looks at blood flow through these arteries that supply the brain with blood. Allow one hour for this exam. There are no restrictions or special instructions.  Follow-up: Dr Allyson SabalBerry recommends that you schedule a follow-up appointment in 12 months. You will receive a reminder letter in the mail two months in advance. If you don't receive a letter, please call our office to schedule the follow-up appointment.  If you need a refill on your cardiac medications before your next appointment, please call your pharmacy.

## 2017-01-16 NOTE — Assessment & Plan Note (Signed)
History of CAD status post posterior lateral branch myocardial infarction with PCI and stenting by myself/12/01. HEENT moderate LAD and circumflex disease at that time. His recatheterized in 2011 revealing 60% in-stent restenosis of his PLA stent which I redilated. Because of crescendo angina he underwent cardiac catheterization by Dr. Tresa EndoKelly 12/22/09 revealing left main/three-vessel disease and underwent coronary artery bypass grafting 4 on 12/20/2009 with a LIMA to LAD, left radial to diagonal branch and sequential vein to the PDA and PLA system. He was recatheterized 1/22 2/12 revealing occluded left radial graft to diagonal branch, patent LIMA to the LAD are patent sequential vein to the PDA/PLA system with normal LV function. He is very active and denies chest pain or shortness of breath.

## 2017-01-16 NOTE — Addendum Note (Signed)
Addended by: Moesha Sarchet L on: 01/16/2017 10:27 AM   Modules accepted: Orders  

## 2017-01-17 DIAGNOSIS — M25551 Pain in right hip: Secondary | ICD-10-CM | POA: Diagnosis not present

## 2017-01-31 LAB — HEPATIC FUNCTION PANEL
ALT: 23 U/L (ref 9–46)
AST: 22 U/L (ref 10–35)
Albumin: 4.3 g/dL (ref 3.6–5.1)
Alkaline Phosphatase: 57 U/L (ref 40–115)
Bilirubin, Direct: 0.2 mg/dL (ref <=0.2)
Indirect Bilirubin: 0.5 mg/dL (ref 0.2–1.2)
Total Bilirubin: 0.7 mg/dL (ref 0.2–1.2)
Total Protein: 6.4 g/dL (ref 6.1–8.1)

## 2017-01-31 LAB — LIPID PANEL
Cholesterol: 149 mg/dL (ref <200)
HDL: 46 mg/dL (ref >40)
LDL Cholesterol: 87 mg/dL (ref <100)
Total CHOL/HDL Ratio: 3.2 Ratio (ref <5.0)
Triglycerides: 78 mg/dL (ref <150)
VLDL: 16 mg/dL (ref <30)

## 2017-02-06 ENCOUNTER — Other Ambulatory Visit: Payer: Self-pay | Admitting: Cardiovascular Disease

## 2017-02-07 DIAGNOSIS — M25551 Pain in right hip: Secondary | ICD-10-CM | POA: Diagnosis not present

## 2017-02-21 DIAGNOSIS — M25551 Pain in right hip: Secondary | ICD-10-CM | POA: Diagnosis not present

## 2017-03-07 ENCOUNTER — Other Ambulatory Visit: Payer: Self-pay | Admitting: Cardiovascular Disease

## 2017-03-07 DIAGNOSIS — M25551 Pain in right hip: Secondary | ICD-10-CM | POA: Diagnosis not present

## 2017-03-28 DIAGNOSIS — M25551 Pain in right hip: Secondary | ICD-10-CM | POA: Diagnosis not present

## 2017-04-09 ENCOUNTER — Other Ambulatory Visit: Payer: Self-pay | Admitting: Cardiovascular Disease

## 2017-04-12 DIAGNOSIS — M25551 Pain in right hip: Secondary | ICD-10-CM | POA: Diagnosis not present

## 2017-04-25 DIAGNOSIS — M25551 Pain in right hip: Secondary | ICD-10-CM | POA: Diagnosis not present

## 2017-05-09 ENCOUNTER — Ambulatory Visit (HOSPITAL_COMMUNITY)
Admission: RE | Admit: 2017-05-09 | Discharge: 2017-05-09 | Disposition: A | Discharge status: Home / Self Care | Payer: 59 | Source: Ambulatory Visit | Attending: Cardiology | Admitting: Cardiology

## 2017-05-09 DIAGNOSIS — I6523 Occlusion and stenosis of bilateral carotid arteries: Secondary | ICD-10-CM | POA: Insufficient documentation

## 2017-05-09 DIAGNOSIS — E785 Hyperlipidemia, unspecified: Secondary | ICD-10-CM | POA: Insufficient documentation

## 2017-05-09 DIAGNOSIS — I2510 Atherosclerotic heart disease of native coronary artery without angina pectoris: Secondary | ICD-10-CM | POA: Diagnosis not present

## 2017-05-09 DIAGNOSIS — Z87891 Personal history of nicotine dependence: Secondary | ICD-10-CM | POA: Diagnosis not present

## 2017-05-09 DIAGNOSIS — Z951 Presence of aortocoronary bypass graft: Secondary | ICD-10-CM | POA: Insufficient documentation

## 2017-05-09 DIAGNOSIS — I779 Disorder of arteries and arterioles, unspecified: Secondary | ICD-10-CM

## 2017-05-09 DIAGNOSIS — I739 Peripheral vascular disease, unspecified: Secondary | ICD-10-CM

## 2017-05-15 ENCOUNTER — Other Ambulatory Visit: Payer: Self-pay | Admitting: Cardiovascular Disease

## 2017-05-15 DIAGNOSIS — I739 Peripheral vascular disease, unspecified: Principal | ICD-10-CM

## 2017-05-15 DIAGNOSIS — I779 Disorder of arteries and arterioles, unspecified: Secondary | ICD-10-CM

## 2017-05-16 DIAGNOSIS — M25551 Pain in right hip: Secondary | ICD-10-CM | POA: Diagnosis not present

## 2017-05-21 DIAGNOSIS — M25551 Pain in right hip: Secondary | ICD-10-CM | POA: Diagnosis not present

## 2017-05-29 DIAGNOSIS — Z23 Encounter for immunization: Secondary | ICD-10-CM | POA: Diagnosis not present

## 2017-05-29 DIAGNOSIS — I119 Hypertensive heart disease without heart failure: Secondary | ICD-10-CM | POA: Diagnosis not present

## 2017-05-29 DIAGNOSIS — Z0000 Encounter for general adult medical examination without abnormal findings: Secondary | ICD-10-CM | POA: Diagnosis not present

## 2017-05-29 DIAGNOSIS — E7800 Pure hypercholesterolemia, unspecified: Secondary | ICD-10-CM | POA: Diagnosis not present

## 2017-05-29 DIAGNOSIS — Z125 Encounter for screening for malignant neoplasm of prostate: Secondary | ICD-10-CM | POA: Diagnosis not present

## 2017-05-29 DIAGNOSIS — G4733 Obstructive sleep apnea (adult) (pediatric): Secondary | ICD-10-CM | POA: Diagnosis not present

## 2017-05-30 DIAGNOSIS — M25551 Pain in right hip: Secondary | ICD-10-CM | POA: Diagnosis not present

## 2017-06-13 DIAGNOSIS — M25551 Pain in right hip: Secondary | ICD-10-CM | POA: Diagnosis not present

## 2017-06-27 DIAGNOSIS — M25551 Pain in right hip: Secondary | ICD-10-CM | POA: Diagnosis not present

## 2017-07-04 ENCOUNTER — Encounter: Payer: 59 | Attending: Family Medicine | Admitting: Registered"

## 2017-07-04 ENCOUNTER — Encounter: Payer: Self-pay | Admitting: Registered"

## 2017-07-04 DIAGNOSIS — E669 Obesity, unspecified: Secondary | ICD-10-CM | POA: Diagnosis not present

## 2017-07-04 DIAGNOSIS — Z713 Dietary counseling and surveillance: Secondary | ICD-10-CM | POA: Diagnosis present

## 2017-07-04 NOTE — Progress Notes (Signed)
Medical Nutrition Therapy:  Appt start time: 1415 end time:  1515.  Assessment:  Primary concerns today: Patient states he has history of heart issues and his BP has been up and down over last 2 months one day was 160/95 - with headache. Patient feels he does pretty well, but wants to get ideas to make better diet choices.  Pt states recently he has had stress related to process of selling wife's house and has not been getting in much physical activity. Pt states he is limited in types of activity due to hip surgery (ligament), but has ideas of activities that he can do.  Sleep - 6 hrs, feels rested. Would like to sleep 8 hrs.  Energy: good except is tired around 3 pm.  Preferred Learning Style:   No preference indicated   Learning Readiness:   Ready  MEDICATIONS: reviewed   DIETARY INTAKE:  Usual eating pattern includes 3 meals and 3 snacks per day.  Avoided foods include "trying to stay away from cheese."  24-hr recall:  B ( AM): egg, toast OR blueberries, 1 tbs yogurt, 1 granola  Snk ( AM): nuts  L ( PM): sub sandwiches tuna salad, chips Snk ( PM): popcorn OR apples D ( PM): pork chops, broccoli OR slaw, soups, casserole Snk ( PM): sweet tooth Beverages: diet coke, hot tea, unsweet tea  Usual physical activity: 1x week tennis   Estimated energy needs: 1800 calories 200 g carbohydrates 113 g protein 60 g fat  Progress Towards Goal(s):  In progress.   Nutritional Diagnosis:  NI-5.10.2 Excessive mineral intake (specify): sodium As related to eating out (sandwiches, soups) often.  As evidenced by dietary recall.    Intervention:  Nutrition Education. Discussed lifestyle habits that affect heart health. Discussed balanced eating and the role of each macronutrient for overall health and energy.  Plan: For heart health:  Aim for 3-5 days per week 30-45 min of physical activity.  Work up to the goal with 10-15 min increments.  Look at [employer]aurant websites for sodium  content of menu items   Think about ways to manage stress.  To help reduce blood pressure aim to eat balanced meals and think about including lots of vegetables throughout the day and nutrient dense carbohydrates at each meal  To reduce inflammation:  Supplement: Tumeric with black pepper  Omega 3 food sources: fish 2-3 week, walnuts, ground flaxseed sprinkled on salad or in yogurt.  For better sleep: Sleep Hygiene Tips handout may be helpful for getting a couple more hours of sleep.  Teaching Method Utilized:  Visual Auditory   Handouts given during visit include:  My Plate Planner  Sleep Hygiene  Fish options for low mercury and high omega 3  [employer] menu nutrition facts PDF from their website  Barriers to learning/adherence to lifestyle change: none  Demonstrated degree of understanding via:  Teach Back   Monitoring/Evaluation:  Dietary intake, exercise, and body weight prn.

## 2017-07-04 NOTE — Patient Instructions (Addendum)
For heart health:  Aim for 3-5 days per week 30-45 min of physical activity.  Work up to the goal with 10-15 min increments.  Look at [employer] websites for sodium content of menu items   Think about ways to manage stress.  To help reduce blood pressure aim to eat balanced meals and think about including lots of vegetables throughout the day and nutrient dense carbohydrates at each meal  To reduce inflammation:  Supplement: Tumeric with black pepper  Omega 3 food sources: fish 2-3 week, walnuts, ground flaxseed sprinkled on salad or in yogurt.  For better sleep: Sleep Hygiene Tips handout may be helpful for getting a couple more hours of sleep.

## 2017-07-11 DIAGNOSIS — M25551 Pain in right hip: Secondary | ICD-10-CM | POA: Diagnosis not present

## 2017-07-25 DIAGNOSIS — M25551 Pain in right hip: Secondary | ICD-10-CM | POA: Diagnosis not present

## 2017-08-01 DIAGNOSIS — G4733 Obstructive sleep apnea (adult) (pediatric): Secondary | ICD-10-CM | POA: Diagnosis not present

## 2017-10-03 DIAGNOSIS — M25551 Pain in right hip: Secondary | ICD-10-CM | POA: Diagnosis not present

## 2017-10-22 DIAGNOSIS — G4733 Obstructive sleep apnea (adult) (pediatric): Secondary | ICD-10-CM | POA: Diagnosis not present

## 2017-10-31 DIAGNOSIS — M25551 Pain in right hip: Secondary | ICD-10-CM | POA: Diagnosis not present

## 2017-11-07 DIAGNOSIS — M25551 Pain in right hip: Secondary | ICD-10-CM | POA: Diagnosis not present

## 2017-11-21 DIAGNOSIS — M25551 Pain in right hip: Secondary | ICD-10-CM | POA: Diagnosis not present

## 2017-11-23 DIAGNOSIS — M25551 Pain in right hip: Secondary | ICD-10-CM | POA: Diagnosis not present

## 2017-11-28 DIAGNOSIS — M25551 Pain in right hip: Secondary | ICD-10-CM | POA: Diagnosis not present

## 2017-12-05 DIAGNOSIS — M25551 Pain in right hip: Secondary | ICD-10-CM | POA: Diagnosis not present

## 2017-12-19 DIAGNOSIS — M25551 Pain in right hip: Secondary | ICD-10-CM | POA: Diagnosis not present

## 2018-01-02 DIAGNOSIS — M25551 Pain in right hip: Secondary | ICD-10-CM | POA: Diagnosis not present

## 2018-01-14 ENCOUNTER — Other Ambulatory Visit: Payer: Self-pay | Admitting: Cardiovascular Disease

## 2018-01-14 DIAGNOSIS — I10 Essential (primary) hypertension: Secondary | ICD-10-CM

## 2018-01-14 DIAGNOSIS — I701 Atherosclerosis of renal artery: Secondary | ICD-10-CM

## 2018-01-16 ENCOUNTER — Ambulatory Visit (HOSPITAL_COMMUNITY)
Admission: RE | Admit: 2018-01-16 | Discharge: 2018-01-16 | Disposition: A | Discharge status: Home / Self Care | Payer: 59 | Source: Ambulatory Visit | Attending: Cardiology | Admitting: Cardiology

## 2018-01-16 DIAGNOSIS — I701 Atherosclerosis of renal artery: Secondary | ICD-10-CM | POA: Diagnosis not present

## 2018-01-16 DIAGNOSIS — I10 Essential (primary) hypertension: Secondary | ICD-10-CM | POA: Diagnosis not present

## 2018-01-16 DIAGNOSIS — M25551 Pain in right hip: Secondary | ICD-10-CM | POA: Diagnosis not present

## 2018-01-18 ENCOUNTER — Ambulatory Visit: Payer: 59 | Admitting: Cardiovascular Disease

## 2018-01-18 ENCOUNTER — Encounter: Payer: Self-pay | Admitting: Cardiovascular Disease

## 2018-01-18 VITALS — BP 135/87 | HR 57 | Ht 64.0 in | Wt 181.0 lb

## 2018-01-18 DIAGNOSIS — I6521 Occlusion and stenosis of right carotid artery: Secondary | ICD-10-CM

## 2018-01-18 DIAGNOSIS — I701 Atherosclerosis of renal artery: Secondary | ICD-10-CM

## 2018-01-18 DIAGNOSIS — E7800 Pure hypercholesterolemia, unspecified: Secondary | ICD-10-CM | POA: Diagnosis not present

## 2018-01-18 NOTE — Assessment & Plan Note (Signed)
History of renal artery stenosis status post renal stenting by myself in the past we followed his renal Dopplers annually which revealed his stent to be widely patent.

## 2018-01-18 NOTE — Progress Notes (Signed)
01/18/2018 [name]   [birth year]  [id number]  Primary Physician Maurice Raider, MD Primary Cardiologist: Runell Gess MD Maurice Park, [place], [place]  HPI:  Maurice Park is a 62 y.o.  widowed (wife died from ALS March 08, 2012) [place] male, father of 1 son Maurice Park) who I last saw  01/16/2017. He is accompanied today by his new wife Maurice Park.He has a history of CAD and PVOD. I stented his posterolateral branch back February 21, 2000. He had moderate LAD and circumflex disease at that time. He was recatheterization in 2011 revealing 60% "in-stent restenosis" of his PLA stent which I redilated. He also had right renal artery stenosis which I stented as well. Dr. Tawanna Cooler Early performed elective right carotid endarterectomy on him in 2007 which we follow by duplex ultrasound. He developed crescendo angina and was catheterized by Dr. Daphene Jaeger December 24, 2009, revealing left main 3-vessel disease. He underwent coronary artery bypass grafting x4 December 20, 2009, with a LIMA to his LAD, a left radial to a diagonal branch, sequential vein to the PDA and PLA system. His postop course was complicated by prolonged paroxysmal atrial fibrillation which he recuperated from nicely. <BR><BR>He had a Myoview stress test performed March 22, 2010, which was nonischemic. Renal Dopplers continue to show widely patent right renal artery stent and carotid Dopplers show patent endarterectomy site. He denies chest pain or shortness of breath. He was catheterized October 02, 2010, revealing an occluded left radial graft to a diagonal branch, patent LIMA to the LAD and a patent sequential vein to the PDA and PLA with normal LV function and patent right renal artery stent. His last lipid profile was a year ago. Since I saw him 18 months ago he's remained completely stable. He is working out with a Psychologist, educational 2 days a week. His son Maurice Park is now 8  years old and has gotten into [employer]his wife of 25 years, [name], died 2012/02/19 of ALS. He remarried to his current wife Maurice Park on 01/29/16. He is very active and plays tennis twice a week and actually skis as well.  Since I saw him a year ago he is remained stable.  He has been on a few pounds.  He is working harder and admits to dietary indiscretion.  He still works out with a Psychologist, educational once a week, plays aggressive tennis and skis without limitation.  Recent renal Dopplers reveals renal stent to be widely patent.     No outpatient medications have been marked as taking for the 01/18/18 encounter (Office Visit) with Runell Gess, MD.     Allergies  Allergen Reactions  . Hydrocortisone Hives  . Codeine Nausea Only  . Contrast Media [Iodinated Diagnostic Agents] Hives  . Neosporin [Neomycin-Polymyxin-Gramicidin] Rash    Social History   Socioeconomic History  . Marital status: Married    Spouse name: Not on file  . Number of children: 1  . Years of education: BSBA  . Highest education level: Not on file  Occupational History  . Not on file  Social Needs  . Financial resource strain: Not on file  . Food insecurity:    Worry: Not on file    Inability: Not on file  . Transportation needs:    Medical: Not on file    Non-medical: Not on file  Tobacco Use  . Smoking status: Former Smoker    Packs/day: 1.00    Years: 12.00    Pack years:  12.00    Types: Cigarettes    Last attempt to quit: 11/09/1981    Years since quitting: 36.2  . Smokeless tobacco: Never Used  Substance and Sexual Activity  . Alcohol use: Yes    Alcohol/week: 1.8 oz    Types: 3 Cans of beer per week    Comment: last drink was in november  . Drug use: No  . Sexual activity: Not Currently    Comment: Right Carotid endarectomy  Lifestyle  . Physical activity:    Days per week: Not on file    Minutes per session: Not on file  . Stress: Not on file  Relationships  . Social connections:    Talks on phone: Not on file    Gets together: Not on file    Attends religious service:  Not on file    Active member of club or organization: Not on file    Attends meetings of clubs or organizations: Not on file    Relationship status: Not on file  . Intimate partner violence:    Fear of current or ex partner: Not on file    Emotionally abused: Not on file    Physically abused: Not on file    Forced sexual activity: Not on file  Other Topics Concern  . Not on file  Social History Narrative  . Not on file     Review of Systems: General: negative for chills, fever, night sweats or weight changes.  Cardiovascular: negative for chest pain, dyspnea on exertion, edema, orthopnea, palpitations, paroxysmal nocturnal dyspnea or shortness of breath Dermatological: negative for rash Respiratory: negative for cough or wheezing Urologic: negative for hematuria Abdominal: negative for nausea, vomiting, diarrhea, bright red blood per rectum, melena, or hematemesis Neurologic: negative for visual changes, syncope, or dizziness All other systems reviewed and are otherwise negative except as noted above.    Blood pressure 135/87, pulse (!) 57, height  (1.626 m), weight 181 lb (82.1 kg).  General appearance: alert and no distress Neck: no adenopathy, no carotid bruit, no JVD, supple, symmetrical, trachea midline and thyroid not enlarged, symmetric, no tenderness/mass/nodules Lungs: clear to auscultation bilaterally Heart: regular rate and rhythm, S1, S2 normal, no murmur, click, rub or gallop Extremities: extremities normal, atraumatic, no cyanosis or edema Pulses: 2+ and symmetric Skin: Skin color, texture, turgor normal. No rashes or lesions Neurologic: Alert and oriented X 3, normal strength and tone. Normal symmetric reflexes. Normal coordination and gait  EKG sinus bradycardia 57 without ST or T wave changes.  I personally reviewed this EKG  ASSESSMENT AND PLAN:   CAD (coronary artery disease), with CABG Lima to LAD, Lt. radial artery to 1st diagnal, sequential SVG to  PDA and PLA, 12/20/09 History of CAD status post multiple coronary interventions and ultimately underwent coronary bypass grafting x4 December 20, 2009 with a LIMA to LAD, left radial to diagonal branch, sequential vein to the PDA PLA system.  His postop course was complicated by PAF which resolved.  He had cardiac catheterization performed 10/02/2010 revealing occluded left radial graft to diagonal branch, patent LIMA to the LAD and a patent sequential vein graft to the PDA and PLA with normal LV function.  He denies chest pain or shortness of breath.  He does work out with a Psychologist, educational once a week, plays tennis aggressively and skis.  Hyperlipidemia, controlled History of hyperlipidemia on statin therapy.  We will recheck a lipid and liver profile  Renal artery stenosis History of renal artery stenosis  status post renal stenting by myself in the past we followed his renal Dopplers annually which revealed his stent to be widely patent.  Carotid artery disease History of carotid artery disease status post elective right carotid endarterectomy for performed by Dr. Arbie Cookey in 2007.  We will follow his duplex ultrasound on annual basis.      Runell Gess MD FACP,FACC,FAHA, [facility] 01/18/2018 4:09 PM

## 2018-01-18 NOTE — Patient Instructions (Addendum)
Medication Instructions: Your physician recommends that you continue on your current medications as directed. Please refer to the Current Medication list given to you today.  Labwork: Your physician recommends that you return for a FASTING lipid profile and hepatic function panel at your earliest convenience.  Testing/Procedures: Your physician has requested that you have a carotid duplex in September. This test is an ultrasound of the carotid arteries in your neck. It looks at blood flow through these arteries that supply the brain with blood. Allow one hour for this exam. There are no restrictions or special instructions.  Your physician has requested that you have a renal artery duplex in May 2020 . During this test, an ultrasound is used to evaluate blood flow to the kidneys. Allow one hour for this exam. Do not eat after midnight the day before and avoid carbonated beverages. Take your medications as you usually do.  Follow-Up: Your physician wants you to follow-up in: 1 year with [name]. You will receive a reminder letter in the mail two months in advance. If you don't receive a letter, please call our office to schedule the follow-up appointment.  If you need a refill on your cardiac medications before your next appointment, please call your pharmacy.

## 2018-01-18 NOTE — Assessment & Plan Note (Signed)
History of carotid artery disease status post elective right carotid endarterectomy for performed by Dr. Arbie Cookey in 2007.  We will follow his duplex ultrasound on annual basis.

## 2018-01-18 NOTE — Assessment & Plan Note (Signed)
History of hyperlipidemia on statin therapy. We will recheck a lipid and liver profile 

## 2018-01-18 NOTE — Assessment & Plan Note (Signed)
History of CAD status post multiple coronary interventions and ultimately underwent coronary bypass grafting x4 December 20, 2009 with a LIMA to LAD, left radial to diagonal branch, sequential vein to the PDA PLA system.  His postop course was complicated by PAF which resolved.  He had cardiac catheterization performed 10/02/2010 revealing occluded left radial graft to diagonal branch, patent LIMA to the LAD and a patent sequential vein graft to the PDA and PLA with normal LV function.  He denies chest pain or shortness of breath.  He does work out with a Psychologist, educational once a week, plays tennis aggressively and skis.

## 2018-01-19 ENCOUNTER — Encounter: Payer: Self-pay | Admitting: Cardiovascular Disease

## 2018-01-25 ENCOUNTER — Other Ambulatory Visit: Payer: Self-pay | Admitting: Pharmacist Clinician (PhC)/ Clinical Pharmacy Specialist

## 2018-01-25 MED ORDER — RANOLAZINE ER 500 MG PO TB12: 500.0000 mg | ORAL_TABLET | Freq: Two times a day (BID) | ORAL | 1 refills | Status: DC

## 2018-02-06 DIAGNOSIS — M25551 Pain in right hip: Secondary | ICD-10-CM | POA: Diagnosis not present

## 2018-02-17 ENCOUNTER — Other Ambulatory Visit: Payer: Self-pay | Admitting: Cardiovascular Disease

## 2018-02-20 DIAGNOSIS — M25551 Pain in right hip: Secondary | ICD-10-CM | POA: Diagnosis not present

## 2018-03-06 DIAGNOSIS — M25551 Pain in right hip: Secondary | ICD-10-CM | POA: Diagnosis not present

## 2018-03-09 ENCOUNTER — Other Ambulatory Visit: Payer: Self-pay | Admitting: Cardiovascular Disease

## 2018-04-12 ENCOUNTER — Other Ambulatory Visit: Payer: Self-pay | Admitting: Cardiovascular Disease

## 2018-04-25 DIAGNOSIS — M25551 Pain in right hip: Secondary | ICD-10-CM | POA: Diagnosis not present

## 2018-04-30 DIAGNOSIS — M25551 Pain in right hip: Secondary | ICD-10-CM | POA: Diagnosis not present

## 2018-04-30 DIAGNOSIS — M545 Low back pain: Secondary | ICD-10-CM | POA: Diagnosis not present

## 2018-05-09 DIAGNOSIS — M25551 Pain in right hip: Secondary | ICD-10-CM | POA: Diagnosis not present

## 2018-05-21 ENCOUNTER — Ambulatory Visit (HOSPITAL_COMMUNITY)
Admission: RE | Admit: 2018-05-21 | Discharge: 2018-05-21 | Disposition: A | Discharge status: Home / Self Care | Payer: 59 | Source: Ambulatory Visit | Attending: Cardiovascular Disease | Admitting: Cardiovascular Disease

## 2018-05-21 DIAGNOSIS — I739 Peripheral vascular disease, unspecified: Secondary | ICD-10-CM

## 2018-05-21 DIAGNOSIS — I779 Disorder of arteries and arterioles, unspecified: Secondary | ICD-10-CM | POA: Insufficient documentation

## 2018-05-23 DIAGNOSIS — M25551 Pain in right hip: Secondary | ICD-10-CM | POA: Diagnosis not present

## 2018-05-25 ENCOUNTER — Telehealth: Payer: Self-pay | Admitting: Physician Assistant

## 2018-05-25 MED ORDER — RANOLAZINE ER 500 MG PO TB12: 500.0000 mg | ORAL_TABLET | Freq: Two times a day (BID) | ORAL | 1 refills | Status: DC

## 2018-05-25 NOTE — Telephone Encounter (Signed)
Called for Ranexa refills. Rx send to requested pharmacy.

## 2018-06-04 ENCOUNTER — Other Ambulatory Visit: Payer: Self-pay | Admitting: *Deleted

## 2018-06-04 DIAGNOSIS — I779 Disorder of arteries and arterioles, unspecified: Secondary | ICD-10-CM

## 2018-06-04 DIAGNOSIS — I6521 Occlusion and stenosis of right carotid artery: Secondary | ICD-10-CM

## 2018-06-04 DIAGNOSIS — I739 Peripheral vascular disease, unspecified: Principal | ICD-10-CM

## 2018-06-06 DIAGNOSIS — M25551 Pain in right hip: Secondary | ICD-10-CM | POA: Diagnosis not present

## 2018-06-20 DIAGNOSIS — M25551 Pain in right hip: Secondary | ICD-10-CM | POA: Diagnosis not present

## 2018-06-25 DIAGNOSIS — Z125 Encounter for screening for malignant neoplasm of prostate: Secondary | ICD-10-CM | POA: Diagnosis not present

## 2018-06-25 DIAGNOSIS — E7800 Pure hypercholesterolemia, unspecified: Secondary | ICD-10-CM | POA: Diagnosis not present

## 2018-06-25 DIAGNOSIS — Z23 Encounter for immunization: Secondary | ICD-10-CM | POA: Diagnosis not present

## 2018-06-25 DIAGNOSIS — I119 Hypertensive heart disease without heart failure: Secondary | ICD-10-CM | POA: Diagnosis not present

## 2018-06-25 DIAGNOSIS — Z0000 Encounter for general adult medical examination without abnormal findings: Secondary | ICD-10-CM | POA: Diagnosis not present

## 2018-07-02 ENCOUNTER — Encounter: Payer: Self-pay | Admitting: Physician Assistant

## 2018-07-02 ENCOUNTER — Ambulatory Visit (INDEPENDENT_AMBULATORY_CARE_PROVIDER_SITE_OTHER): Payer: 59 | Admitting: Physician Assistant

## 2018-07-02 ENCOUNTER — Telehealth (HOSPITAL_COMMUNITY): Payer: Self-pay | Admitting: Cardiovascular Disease

## 2018-07-02 VITALS — BP 160/82 | HR 60 | Ht 64.0 in | Wt 185.2 lb

## 2018-07-02 DIAGNOSIS — I6521 Occlusion and stenosis of right carotid artery: Secondary | ICD-10-CM | POA: Diagnosis not present

## 2018-07-02 DIAGNOSIS — I701 Atherosclerosis of renal artery: Secondary | ICD-10-CM | POA: Diagnosis not present

## 2018-07-02 DIAGNOSIS — I2510 Atherosclerotic heart disease of native coronary artery without angina pectoris: Secondary | ICD-10-CM | POA: Diagnosis not present

## 2018-07-02 DIAGNOSIS — I10 Essential (primary) hypertension: Secondary | ICD-10-CM | POA: Insufficient documentation

## 2018-07-02 MED ORDER — HYDRALAZINE HCL 25 MG PO TABS: 25.0000 mg | ORAL_TABLET | Freq: Two times a day (BID) | ORAL | 3 refills | Status: DC

## 2018-07-02 MED ORDER — NITROGLYCERIN 0.4 MG SL SUBL: 0.4000 mg | SUBLINGUAL_TABLET | SUBLINGUAL | 3 refills | Status: DC | PRN

## 2018-07-02 NOTE — Telephone Encounter (Signed)
New message  Pt c/o BP issue: STAT if pt c/o blurred vision, one-sided weakness or slurred speech  1. What are your last 5 BP readings? 07/02/2018 168/106  179/117 07/01/18 174/103   2. Are you having any other symptoms (ex. Dizziness, headache, blurred vision, passed out)? Headache, tired   3. What is your BP issue? Patient states that his bp is elevated

## 2018-07-02 NOTE — Telephone Encounter (Signed)
Received call from patient.He stated he is concerned his B/P has been elevated for the past 1 week.Stated he is taking medications as prescribed.Stated he had a severe headache last night.Appointment scheduled with Micah Flesher PA this afternoon at 2:30 pm.Advised to bring all medications and B/P readings.

## 2018-07-02 NOTE — Progress Notes (Signed)
Cardiology Office Note:    Date:  07/02/2018   ID:  Maurice Park, DOB [birth year], MRN [id number]  PCP:  Lupita Raider, MD  Cardiologist:  Nanetta Batty, MD   Referring MD: Lupita Raider, MD   Chief Complaint  Patient presents with  . Hypertension    History of Present Illness:    Maurice Park is a 62 y.o. male with a hx of CAD s/p CABG x 4 (LIMA-LAD, L radial-1st diagonal, SVG to PDA and PLA 12/20/09), carotid artery disease s/p right carotid endarterectomy 2007, renal artery stenosis s/p stenting, and HLD. Heart cath in 09/2010 with occluded left radial graft to diagonal, patent LIMA-LAD and patent SVG to PDA and PLA. Normal LV funciton, patent right renal artery stent. He is very active with sports and gym. He was last seen by [name] on 01/18/18 and was doing well at that time.   He called the office today stating his pressure had been elevated and he had been having headaches. His home medications include: 50 mg toprol 5 mg ramipril  He started taking meloxicam 4-5 weeks ago for hip pain. 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 He was prescribed meloxicam.  He has noticed increased blood pressure over the past several days. Over the past week, pressure has been as high as 190/119. He had a headache on Sunday night and has experienced intermittent chest tightness with that blood pressure. He continues to be active and meloxicam has been helping with his hip pain. We discussed that this is not a good long-term solution given his heart disease, carotid artery stenosis, and renal artery stenosis. He has been taking 325 mg aspirin,but should only be taking 81 mg ASA. He is amenable to other analgesic regimens and I have encouraged him to speak with his ortho doc about a non-NSAID option. In th meantime, I will adjust his anti-hypertensive regimen.   Past Medical History:  Diagnosis Date  . Angina   . Anxiety   . CAD (coronary  artery disease), with CABG Lima to LAD, Lt. radial artery to 1st diagnal, sequential SVG to PDA and PLA, 12/20/09 April 2011  . Carotid artery disease (HCC)    status post right carotid endarterectomy  . Dysrhythmia   . Hyperlipidemia, controlled   . Peripheral vascular disease (HCC)   . Renal artery stenosis (HCC)   . Situational mixed anxiety and depressive disorder,      Past Surgical History:  Procedure Laterality Date  . CARDIAC CATHETERIZATION  12/14/2009   multivessel CAD >> CABG (Dr. Nicki Guadalajara)  . CARDIAC CATHETERIZATION  10/03/2011   L Cfx w/80% ostial stenosis, ramus with 50% segmental mid stenosis; RCA dominant & occluded in midportion; LIMA to LAD patent; SVG to PDA/PLA patent; free L radial to diagonal functionally occluded (Dr. Erlene Quan)  . CAROTID DOPPLER  11/2011   R CEA w/normal patency; left bulb w/0-49% diameter reduction  . CAROTID ENDARTERECTOMY Right 05/03/2006   Dr. Tawanna Cooler Early  . CORONARY ANGIOPLASTY  02/21/2000   PTCA with 3.25x21mm Quantum Monorail balloon of in-stent posterolateral branch restenosis, reduced from 60% to under 20% (Dr. Erlene Quan)  . CORONARY ARTERY BYPASS GRAFT  12/20/2009   LIMA-LAD, left radial-1st diagonal, SVG-PDA & PLA (Dr. Kathie Rhodes. Hendrickson)  . LEFT HEART CATHETERIZATION WITH CORONARY/GRAFT ANGIOGRAM N/A 10/03/2011   Procedure: LEFT HEART CATHETERIZATION WITH Isabel Caprice;  Surgeon: Runell Gess, MD;  Location: [facility] CATH LAB;  Service: Cardiovascular;  Laterality: N/A;  . LOWER EXTREMITY ARTERIAL DOPPLER  11/2005   normal study   . NM MYOCAR IMG MI  04/2011   bruce myoview - normal perfusion; EF 58%; low risk scan  . RENAL DOPPLER  12/2012   right renal stent w/1-59% diameter reduction  . TRANSTHORACIC ECHOCARDIOGRAM  04/2011   EF=>55%; trace MR; mild TR    Current Medications: Current Meds  Medication Sig  . aspirin EC 81 MG tablet Take 81 mg by mouth at bedtime.  Marland Kitchen ezetimibe-simvastatin (VYTORIN) 10-20 MG tablet TAKE 1 TABLET  DAILY  . meloxicam (MOBIC) 15 MG tablet Take 15 mg by mouth daily. with food  . metoprolol succinate (TOPROL-XL) 50 MG 24 hr tablet TAKE 1 TABLET DAILY WITH OR IMMEDIATELY FOLLOWING A MEAL  . ramipril (ALTACE) 5 MG capsule TAKE 1 CAPSULE DAILY  . ranolazine (RANEXA) 500 MG 12 hr tablet Take 1 tablet (500 mg total) by mouth 2 (two) times daily.  Marland Kitchen VIAGRA 100 MG tablet Take 1 tablet by mouth as directed.     Allergies:   Hydrocortisone; Codeine; Contrast media [iodinated diagnostic agents]; and Neosporin [neomycin-polymyxin-gramicidin]   Social History   Socioeconomic History  . Marital status: Married    Spouse name: Not on file  . Number of children: 1  . Years of education: BSBA  . Highest education level: Not on file  Occupational History  . Not on file  Social Needs  . Financial resource strain: Not on file  . Food insecurity:    Worry: Not on file    Inability: Not on file  . Transportation needs:    Medical: Not on file    Non-medical: Not on file  Tobacco Use  . Smoking status: Former Smoker    Packs/day: 1.00    Years: 12.00    Pack years: 12.00    Types: Cigarettes    Last attempt to quit: 11/09/1981    Years since quitting: 36.6  . Smokeless tobacco: Never Used  Substance and Sexual Activity  . Alcohol use: Yes    Alcohol/week: 3.0 standard drinks    Types: 3 Cans of beer per week    Comment: last drink was in november  . Drug use: No  . Sexual activity: Not Currently    Comment: Right Carotid endarectomy  Lifestyle  . Physical activity:    Days per week: Not on file    Minutes per session: Not on file  . Stress: Not on file  Relationships  . Social connections:    Talks on phone: Not on file    Gets together: Not on file    Attends religious service: Not on file    Active member of club or organization: Not on file    Attends meetings of clubs or organizations: Not on file    Relationship status: Not on file  Other Topics Concern  . Not on file    Social History Narrative  . Not on file     Family History: The patient's family history includes Coronary artery disease (age of onset: 43) in his father; Heart attack (age of onset: 55) in his father; Heart attack (age of onset: 69) in his paternal grandfather; Heart disease (age of onset: 12) in his paternal grandmother; Hyperlipidemia in his brother; Hypertension (age of onset: 53) in his mother; Stroke (age of onset: 69) in his paternal grandmother.  ROS:   Please see the history of present illness.     All  other systems reviewed and are negative.  EKGs/Labs/Other Studies Reviewed:    The following studies were reviewed today:   EKG:  EKG is not ordered today.    Recent Labs: No results found for requested labs within last 8760 hours.  Recent Lipid Panel    Component Value Date/Time   CHOL 149 01/31/2017 0819   TRIG 78 01/31/2017 0819   HDL 46 01/31/2017 0819   CHOLHDL 3.2 01/31/2017 0819   VLDL 16 01/31/2017 0819   LDLCALC 87 01/31/2017 0819    Physical Exam:    VS:  BP (!) 160/82   Pulse 60   Ht 5\' 4"  (1.626 m)   Wt 185 lb 3.2 oz (84 kg)   BMI 31.79 kg/m     Wt Readings from Last 3 Encounters:  07/02/18 185 lb 3.2 oz (84 kg)  01/18/18 181 lb (82.1 kg)  01/16/17 176 lb 12.8 oz (80.2 kg)     GEN:  Well nourished, well developed in no acute distress HEENT: Normal NECK: No JVD; No carotid bruits CARDIAC: RRR, no murmurs, rubs, gallops RESPIRATORY:  Clear to auscultation without rales, wheezing or rhonchi  ABDOMEN: Soft, non-tender, non-distended MUSCULOSKELETAL:  No edema; No deformity  SKIN: Warm and dry NEUROLOGIC:  Alert and oriented x 3 PSYCHIATRIC:  Normal affect   ASSESSMENT:    1. Essential hypertension   2. Coronary artery disease involving native coronary artery of native heart without angina pectoris   3. Renal artery stenosis (HCC)   4. Stenosis of right carotid artery    PLAN:    In order of problems listed above:  Essential  hypertension Pt has had an increase in BP with meloxicam. I have suggested this is not a great long-term solution for his hip pain given his cardiovascular health. Until he can see his ortho doc again, I will add hydralazine to his current regimen at 25 mg BID. I did not increase his ACEI because I want to check his renal function given his uncontrolled hypertension. Will collect a BMP. If elevated, will consider holding ACEI for now and increasing hydralazine until he can optimize his pain regimen.   Coronary artery disease involving native coronary artery of native heart without angina pectoris I suspect his chest tightness was due to his hypertensive urgency. He denies exertional chest pain. Will work on BP control for now.   Renal artery stenosis (HCC) If BP is not controlled with D/C meloxicam and adding hydralazine, may need to repeat dopplers.  Stenosis of right carotid artery Stable.   I would like to see him back in 1 month.     Medication Adjustments/Labs and Tests Ordered: Current medicines are reviewed at length with the patient today.  Concerns regarding medicines are outlined above.  No orders of the defined types were placed in this encounter.  No orders of the defined types were placed in this encounter.   Signed, Marcelino Duster, PA  07/02/2018 3:07 PM    [facility]

## 2018-07-02 NOTE — Patient Instructions (Signed)
Your physician has recommended you make the following change in your medication:  DECREASE ASPIRIN TO 81 MG EVERY DAY START HYDRALAZINE 25 MG TWICE DAILY   Your physician recommends that you return for lab work in:  TODAY  BMET   Your physician recommends that you schedule a follow-up appointment in:  1 MONTH WITH DR Allyson Sabal

## 2018-07-02 NOTE — Addendum Note (Signed)
Addended by: Scherrie Bateman E on: 07/02/2018 03:11 PM   Modules accepted: Orders

## 2018-07-03 LAB — BASIC METABOLIC PANEL
BUN/Creatinine Ratio: 13 (ref 10–24)
BUN: 15 mg/dL (ref 8–27)
CO2: 27 mmol/L (ref 20–29)
Calcium: 9.1 mg/dL (ref 8.6–10.2)
Chloride: 103 mmol/L (ref 96–106)
Creatinine, Ser: 1.14 mg/dL (ref 0.76–1.27)
GFR calc Af Amer: 80 mL/min/{1.73_m2} (ref >59)
GFR calc non Af Amer: 69 mL/min/{1.73_m2} (ref >59)
Glucose: 90 mg/dL (ref 65–99)
Potassium: 4.1 mmol/L (ref 3.5–5.2)
Sodium: 143 mmol/L (ref 134–144)

## 2018-07-04 DIAGNOSIS — M25551 Pain in right hip: Secondary | ICD-10-CM | POA: Diagnosis not present

## 2018-07-24 DIAGNOSIS — Z86010 Personal history of colonic polyps: Secondary | ICD-10-CM | POA: Diagnosis not present

## 2018-07-29 DIAGNOSIS — R7301 Impaired fasting glucose: Secondary | ICD-10-CM | POA: Diagnosis not present

## 2018-08-06 ENCOUNTER — Ambulatory Visit: Payer: 59 | Admitting: Cardiovascular Disease

## 2018-08-06 ENCOUNTER — Encounter: Payer: Self-pay | Admitting: Cardiovascular Disease

## 2018-08-06 VITALS — BP 144/78 | HR 67 | Ht 64.5 in | Wt 181.2 lb

## 2018-08-06 DIAGNOSIS — I701 Atherosclerosis of renal artery: Secondary | ICD-10-CM

## 2018-08-06 DIAGNOSIS — I2510 Atherosclerotic heart disease of native coronary artery without angina pectoris: Secondary | ICD-10-CM

## 2018-08-06 DIAGNOSIS — I6521 Occlusion and stenosis of right carotid artery: Secondary | ICD-10-CM

## 2018-08-06 DIAGNOSIS — E7800 Pure hypercholesterolemia, unspecified: Secondary | ICD-10-CM

## 2018-08-06 DIAGNOSIS — I10 Essential (primary) hypertension: Secondary | ICD-10-CM

## 2018-08-06 NOTE — Progress Notes (Signed)
08/06/2018 Maurice Park   [birth year]  [id number]  Primary Physician Maurice Raider, MD Primary Cardiologist: Maurice Gess MD Maurice Park, [place], [place]  HPI:  Maurice Park is a 62 y.o.  widowed (wife died from ALS 2012/03/22) [place] male, father of 1 son Maurice Park) who I last saw  01/18/2018. He is accompanied today by his new wife Maurice Park.He has a history of CAD and PVOD. I stented his posterolateral branch back February 21, 2000. He had moderate LAD and circumflex disease at that time. He was recatheterization in 2011 revealing 60% "in-stent restenosis" of his PLA stent which I redilated. He also had right renal artery stenosis which I stented as well. Dr. Tawanna Cooler Early performed elective right carotid endarterectomy on him in 2007 which we follow by duplex ultrasound. He developed crescendo angina and was catheterized by Dr. Daphene Jaeger December 24, 2009, revealing left main 3-vessel disease. He underwent coronary artery bypass grafting x4 December 20, 2009, with a LIMA to his LAD, a left radial to a diagonal branch, sequential vein to the PDA and PLA system. His postop course was complicated by prolonged paroxysmal atrial fibrillation which he recuperated from nicely. <BR><BR>He had a Myoview stress test performed March 22, 2010, which was nonischemic. Renal Dopplers continue to show widely patent right renal artery stent and carotid Dopplers show patent endarterectomy site. He denies chest pain or shortness of breath. He was catheterized October 02, 2010, revealing an occluded left radial graft to a diagonal branch, patent LIMA to the LAD and a patent sequential vein to the PDA and PLA with normal LV function and patent right renal artery stent. His last lipid profile was a year ago. Since I saw him 18 months ago he's remained completely stable. He is working out with a Psychologist, educational 2 days a week. His son Maurice Park is now 1years old and has gotten into [employer]his wife of 25 years, Maurice Park, died  Jul 01, 2013of ALS.He remarried to his current wife Maurice Park on 01/29/16.He is very active andplays tennis twice a week and actually skis as well.  Since I saw him a year ago he is remained stable.  He has been on a few pounds.  He is working harder and admits to dietary indiscretion.  He still works out with a Psychologist, educational once a week, plays aggressive tennis and skis without limitation.  Recent renal Dopplers reveals renal stent to be widely patent.  Since I saw him 6 months ago he has been doing well until recently when he saw Maurice Flesher PA in the office 07/02/2018 because of elevated blood pressure.  This was probably related to using nonsteroidal anti-inflammatory medications.  This was discontinued and hydralazine begun.  Blood pressures have been under better control.  He denies chest pain or shortness of breath.   Current Meds  Medication Sig  . aspirin EC 81 MG tablet Take 81 mg by mouth at bedtime.  Marland Kitchen ezetimibe-simvastatin (VYTORIN) 10-20 MG tablet TAKE 1 TABLET DAILY  . hydrALAZINE (APRESOLINE) 25 MG tablet Take 1 tablet (25 mg total) by mouth 2 (two) times daily.  . metoprolol succinate (TOPROL-XL) 50 MG 24 hr tablet TAKE 1 TABLET DAILY WITH OR IMMEDIATELY FOLLOWING A MEAL  . nitroGLYCERIN (NITROSTAT) 0.4 MG SL tablet Place 1 tablet (0.4 mg total) under the tongue every 5 (five) minutes as needed for up to 25 days for chest pain.  . ramipril (ALTACE) 5 MG capsule TAKE 1 CAPSULE DAILY  .  ranolazine (RANEXA) 500 MG 12 hr tablet Take 1 tablet (500 mg total) by mouth 2 (two) times daily.  Marland Kitchen VIAGRA 100 MG tablet Take 1 tablet by mouth as directed.  . [DISCONTINUED] aspirin 325 MG tablet Take 325 mg by mouth daily.     Allergies  Allergen Reactions  . Hydrocortisone Hives  . Codeine Nausea Only  . Contrast Media [Iodinated Diagnostic Agents] Hives  . Neosporin [Neomycin-Polymyxin-Gramicidin] Rash    Social History   Socioeconomic History  . Marital status: Married    Spouse name: Not  on file  . Number of children: 1  . Years of education: BSBA  . Highest education level: Not on file  Occupational History  . Not on file  Social Needs  . Financial resource strain: Not on file  . Food insecurity:    Worry: Not on file    Inability: Not on file  . Transportation needs:    Medical: Not on file    Non-medical: Not on file  Tobacco Use  . Smoking status: Former Smoker    Packs/day: 1.00    Years: 12.00    Pack years: 12.00    Types: Cigarettes    Last attempt to quit: 11/09/1981    Years since quitting: 36.7  . Smokeless tobacco: Never Used  Substance and Sexual Activity  . Alcohol use: Yes    Alcohol/week: 3.0 standard drinks    Types: 3 Cans of beer per week    Comment: last drink was in november  . Drug use: No  . Sexual activity: Not Currently    Comment: Right Carotid endarectomy  Lifestyle  . Physical activity:    Days per week: Not on file    Minutes per session: Not on file  . Stress: Not on file  Relationships  . Social connections:    Talks on phone: Not on file    Gets together: Not on file    Attends religious service: Not on file    Active member of club or organization: Not on file    Attends meetings of clubs or organizations: Not on file    Relationship status: Not on file  . Intimate partner violence:    Fear of current or ex partner: Not on file    Emotionally abused: Not on file    Physically abused: Not on file    Forced sexual activity: Not on file  Other Topics Concern  . Not on file  Social History Narrative  . Not on file     Review of Systems: General: negative for chills, fever, night sweats or weight changes.  Cardiovascular: negative for chest pain, dyspnea on exertion, edema, orthopnea, palpitations, paroxysmal nocturnal dyspnea or shortness of breath Dermatological: negative for rash Respiratory: negative for cough or wheezing Urologic: negative for hematuria Abdominal: negative for nausea, vomiting, diarrhea,  bright red blood per rectum, melena, or hematemesis Neurologic: negative for visual changes, syncope, or dizziness All other systems reviewed and are otherwise negative except as noted above.    Blood pressure (!) 144/78, pulse 67, height 5' 4.5" (1.638 m), weight 181 lb 3.2 oz (82.2 kg).  General appearance: alert and no distress Neck: no adenopathy, no carotid bruit, no JVD, supple, symmetrical, trachea midline and thyroid not enlarged, symmetric, no tenderness/mass/nodules Lungs: clear to auscultation bilaterally Heart: regular rate and rhythm, S1, S2 normal, no murmur, click, rub or gallop Extremities: extremities normal, atraumatic, no cyanosis or edema Pulses: 2+ and symmetric Skin: Skin color, texture, turgor  normal. No rashes or lesions Neurologic: Alert and oriented X 3, normal strength and tone. Normal symmetric reflexes. Normal coordination and gait  EKG normal sinus rhythm at 67 with occasional PVCs.  I personally reviewed this EKG.  ASSESSMENT AND PLAN:   CAD (coronary artery disease), with CABG [place] to LAD, Lt. radial artery to 1st diagnal, sequential SVG to PDA and PLA, 12/20/09 History of CAD status post posterior lateral branch intervention by myself 02/21/2000.  He had moderate LAD and circumflex disease at that time.  He was recatheterized in 2011 revealing 60% "in-stent restenosis of his PLA which I re-dilated.  Because of crescendo angina he underwent cardiac cath by Dr. Daphene Jaegerom Kelly 12/24/2009 revealing left main/three-vessel disease and underwent coronary artery bypass grafting x4 on December 20, 2009 with a LIMA to his LAD, left radial to diagonal branch, sequential vein to the PDA and PLA.  He was recatheterized 10/02/2010 because of recurrent chest pain revealing occluded left radial graft to diagonal branch, patent LIMA to the LAD patent sequential vein to the PDA and PLA with normal LV function.  He remains currently stable denies chest pain or shortness of  breath.  Hyperlipidemia, controlled History of hyperlipidemia on Vytorin with lipid profile with lipid profile performed 06/25/2018 revealing total cholesterol 152, LDL of 82 and HDL 53.  He does admit to dietary indiscretion.  Will recheck a lipid liver profile in 3 months.  Renal artery stenosis History of renal artery stenosis status post right renal artery stenting in the past with multiple angiogram since that time revealing this to be patent and recent Doppler study performed 01/16/2018 revealing a widely patent stent.  Carotid artery disease History of carotid artery disease status post elective right carotid endarterectomy performed by Dr. Arbie CookeyEarly in 2007.  We follow him by duplex ultrasound on annual basis which was most recently performed 05/21/2018 revealing this to be widely patent.  Hypertension History of essential hypertension blood pressure measured today 144/78.  He was recently seen by Maurice GaviaAngie Duke NP for elevated blood pressure in the 160 range probably related to using nonsteroidal anti-inflammatory medications which were discontinued.  He was begun on hydralazine and he has been monitoring his blood pressure at home revealing improved readings.      Maurice GessJonathan J. Nollie Terlizzi MD FACP,FACC,FAHA, [facility] 08/06/2018 4:42 PM

## 2018-08-06 NOTE — Addendum Note (Signed)
Addended by: Harlow AsaEWURUM, Cristofer Yaffe O on: 08/06/2018 04:52 PM   Modules accepted: Orders

## 2018-08-06 NOTE — Assessment & Plan Note (Signed)
History of renal artery stenosis status post right renal artery stenting in the past with multiple angiogram since that time revealing this to be patent and recent Doppler study performed 01/16/2018 revealing a widely patent stent.

## 2018-08-06 NOTE — Patient Instructions (Signed)
Medication Instructions:  NONE If you need a refill on your cardiac medications before your next appointment, please call your pharmacy.   Lab work: Your physician recommends that you return for lab work in: 3 MONTHS PRIOR TO EATING  If you have labs (blood work) drawn today and your tests are completely normal, you will receive your results only by: Marland Kitchen. MyChart Message (if you have MyChart) OR . A paper copy in the mail If you have any lab test that is abnormal or we need to change your treatment, we will call you to review the results.  Testing/Procedures: Your physician has requested that you have a carotid duplex. This test is an ultrasound of the carotid arteries in your neck. It looks at blood flow through these arteries that supply the brain with blood. Allow one hour for this exam. There are no restrictions or special instructions. SCHEDULE IN September 2020  Your physician has requested that you have a renal artery duplex. During this test, an ultrasound is used to evaluate blood flow to the kidneys. Allow one hour for this exam. Do not eat after midnight the day before and avoid carbonated beverages. Take your medications as you usually do.  SCHEDULE IN September 2020  Follow-Up: At [facility], you and your health needs are our priority.  As part of our continuing mission to provide you with exceptional heart care, we have created designated Provider Care Teams.  These Care Teams include your primary Cardiologist (physician) and Advanced Practice Providers (APPs -  Physician Assistants and Nurse Practitioners) who all work together to provide you with the care you need, when you need it. You will need a follow up appointment in 12 months.  Please call our office 2 months in advance to schedule this appointment.  You may see Nanetta BattyJonathan Berry, MD or one of the following Advanced Practice Providers on your designated Care Team:   Corine ShelterLuke Kilroy, PA-C Judy PimpleKrista Kroeger, [place]PA-C . Marjie Skiffallie  Goodrich, PA-C

## 2018-08-06 NOTE — Assessment & Plan Note (Signed)
History of CAD status post posterior lateral branch intervention by myself 02/21/2000.  He had moderate LAD and circumflex disease at that time.  He was recatheterized in 2011 revealing 60% "in-stent restenosis of his PLA which I re-dilated.  Because of crescendo angina he underwent cardiac cath by Dr. Daphene Jaegerom Kelly 12/24/2009 revealing left main/three-vessel disease and underwent coronary artery bypass grafting x4 on December 20, 2009 with a LIMA to his LAD, left radial to diagonal branch, sequential vein to the PDA and PLA.  He was recatheterized 10/02/2010 because of recurrent chest pain revealing occluded left radial graft to diagonal branch, patent LIMA to the LAD patent sequential vein to the PDA and PLA with normal LV function.  He remains currently stable denies chest pain or shortness of breath.

## 2018-08-06 NOTE — Assessment & Plan Note (Addendum)
History of hyperlipidemia on Vytorin with lipid profile with lipid profile performed 06/25/2018 revealing total cholesterol 152, LDL of 82 and HDL 53.  He does admit to dietary indiscretion.  Will recheck a lipid liver profile in 3 months.

## 2018-08-06 NOTE — Assessment & Plan Note (Signed)
History of essential hypertension blood pressure measured today 144/78.  He was recently seen by Bettina GaviaAngie Duke NP for elevated blood pressure in the 160 range probably related to using nonsteroidal anti-inflammatory medications which were discontinued.  He was begun on hydralazine and he has been monitoring his blood pressure at home revealing improved readings.

## 2018-08-06 NOTE — Assessment & Plan Note (Signed)
History of carotid artery disease status post elective right carotid endarterectomy performed by Dr. Arbie CookeyEarly in 2007.  We follow Maurice Park by duplex ultrasound on annual basis which was most recently performed 05/21/2018 revealing this to be widely patent.

## 2018-08-16 DIAGNOSIS — R066 Hiccough: Secondary | ICD-10-CM | POA: Diagnosis not present

## 2018-09-02 DIAGNOSIS — G4733 Obstructive sleep apnea (adult) (pediatric): Secondary | ICD-10-CM | POA: Diagnosis not present

## 2018-09-10 ENCOUNTER — Other Ambulatory Visit: Payer: Self-pay | Admitting: Cardiovascular Disease

## 2018-09-19 ENCOUNTER — Other Ambulatory Visit: Payer: Self-pay | Admitting: Physician Assistant

## 2018-09-19 NOTE — Telephone Encounter (Signed)
Rx has been sent to the pharmacy electronically. ° °

## 2018-11-03 ENCOUNTER — Other Ambulatory Visit: Payer: Self-pay | Admitting: Cardiovascular Disease

## 2018-11-06 DIAGNOSIS — M25551 Pain in right hip: Secondary | ICD-10-CM | POA: Diagnosis not present

## 2018-11-14 DIAGNOSIS — M25551 Pain in right hip: Secondary | ICD-10-CM | POA: Diagnosis not present

## 2018-12-05 ENCOUNTER — Other Ambulatory Visit: Payer: Self-pay | Admitting: Cardiovascular Disease

## 2018-12-13 DIAGNOSIS — G4733 Obstructive sleep apnea (adult) (pediatric): Secondary | ICD-10-CM | POA: Diagnosis not present

## 2019-03-13 ENCOUNTER — Other Ambulatory Visit: Payer: Self-pay | Admitting: Cardiovascular Disease

## 2019-05-27 ENCOUNTER — Ambulatory Visit (HOSPITAL_COMMUNITY)
Admission: RE | Admit: 2019-05-27 | Discharge: 2019-05-27 | Disposition: A | Discharge status: Home / Self Care | Payer: 59 | Source: Ambulatory Visit | Attending: Cardiovascular Disease | Admitting: Cardiovascular Disease

## 2019-05-27 ENCOUNTER — Ambulatory Visit (HOSPITAL_BASED_OUTPATIENT_CLINIC_OR_DEPARTMENT_OTHER)
Admission: RE | Admit: 2019-05-27 | Discharge: 2019-05-27 | Disposition: A | Discharge status: Home / Self Care | Payer: 59 | Source: Ambulatory Visit | Attending: Cardiovascular Disease | Admitting: Cardiovascular Disease

## 2019-05-27 ENCOUNTER — Other Ambulatory Visit (HOSPITAL_COMMUNITY): Payer: Self-pay | Admitting: Cardiovascular Disease

## 2019-05-27 ENCOUNTER — Other Ambulatory Visit: Payer: Self-pay

## 2019-05-27 DIAGNOSIS — I6523 Occlusion and stenosis of bilateral carotid arteries: Secondary | ICD-10-CM

## 2019-05-27 DIAGNOSIS — I701 Atherosclerosis of renal artery: Secondary | ICD-10-CM

## 2019-05-27 DIAGNOSIS — I739 Peripheral vascular disease, unspecified: Secondary | ICD-10-CM | POA: Insufficient documentation

## 2019-05-27 DIAGNOSIS — I6521 Occlusion and stenosis of right carotid artery: Secondary | ICD-10-CM | POA: Insufficient documentation

## 2019-05-27 DIAGNOSIS — Z9889 Other specified postprocedural states: Secondary | ICD-10-CM

## 2019-05-27 DIAGNOSIS — I779 Disorder of arteries and arterioles, unspecified: Secondary | ICD-10-CM

## 2019-06-13 ENCOUNTER — Other Ambulatory Visit: Payer: Self-pay | Admitting: Cardiovascular Disease

## 2019-06-25 ENCOUNTER — Other Ambulatory Visit: Payer: Self-pay | Admitting: Physician Assistant

## 2019-06-26 NOTE — Telephone Encounter (Signed)
This is Dr. Gwenlyn Found

## 2019-07-15 LAB — LIPID PANEL
Chol/HDL Ratio: 2.7 ratio (ref 0.0–5.0)
Cholesterol, Total: 141 mg/dL (ref 100–199)
HDL: 52 mg/dL (ref >39)
LDL Chol Calc (NIH): 71 mg/dL (ref 0–99)
Triglycerides: 100 mg/dL (ref 0–149)
VLDL Cholesterol Cal: 18 mg/dL (ref 5–40)

## 2019-07-15 LAB — HEPATIC FUNCTION PANEL
ALT: 39 IU/L (ref 0–44)
AST: 26 IU/L (ref 0–40)
Albumin: 4.4 g/dL (ref 3.8–4.8)
Alkaline Phosphatase: 66 IU/L (ref 39–117)
Bilirubin Total: 0.7 mg/dL (ref 0.0–1.2)
Bilirubin, Direct: 0.18 mg/dL (ref 0.00–0.40)
Total Protein: 6.4 g/dL (ref 6.0–8.5)

## 2019-07-16 ENCOUNTER — Encounter: Payer: Self-pay | Admitting: *Deleted

## 2019-07-25 ENCOUNTER — Ambulatory Visit: Payer: 59 | Admitting: Cardiovascular Disease

## 2019-07-25 ENCOUNTER — Encounter: Payer: Self-pay | Admitting: Cardiovascular Disease

## 2019-07-25 ENCOUNTER — Other Ambulatory Visit: Payer: Self-pay

## 2019-07-25 VITALS — BP 140/82 | HR 54 | Ht 64.5 in | Wt 187.3 lb

## 2019-07-25 DIAGNOSIS — I10 Essential (primary) hypertension: Secondary | ICD-10-CM | POA: Diagnosis not present

## 2019-07-25 DIAGNOSIS — I701 Atherosclerosis of renal artery: Secondary | ICD-10-CM

## 2019-07-25 DIAGNOSIS — I6521 Occlusion and stenosis of right carotid artery: Secondary | ICD-10-CM | POA: Diagnosis not present

## 2019-07-25 DIAGNOSIS — E782 Mixed hyperlipidemia: Secondary | ICD-10-CM

## 2019-07-25 MED ORDER — NITROGLYCERIN 0.4 MG SL SUBL: 0.4000 mg | SUBLINGUAL_TABLET | SUBLINGUAL | 3 refills | Status: DC | PRN

## 2019-07-25 NOTE — Addendum Note (Signed)
Addended by: Cain Sieve on: 07/25/2019 08:51 AM   Modules accepted: Orders

## 2019-07-25 NOTE — Patient Instructions (Addendum)
Medication Instructions:  Your physician recommends that you continue on your current medications as directed. Please refer to the Current Medication list given to you today.  If you need a refill on your cardiac medications before your next appointment, please call your pharmacy.   Lab work: NONE  Testing/Procedures: Your physician has requested that you have a carotid duplex in one year. This test is an ultrasound of the carotid arteries in your neck. It looks at blood flow through these arteries that supply the brain with blood. Allow one hour for this exam. There are no restrictions or special instructions.  Your physician has requested that you have a renal artery duplex in one year. During this test, an ultrasound is used to evaluate blood flow to the kidneys. Allow one hour for this exam. Do not eat after midnight the day before and avoid carbonated beverages. Take your medications as you usually do.  Follow-Up: At [facility], you and your health needs are our priority.  As part of our continuing mission to provide you with exceptional heart care, we have created designated Provider Care Teams.  These Care Teams include your primary Cardiologist (physician) and Advanced Practice Providers (APPs -  Physician Assistants and Nurse Practitioners) who all work together to provide you with the care you need, when you need it. You may see Quay Burow, MD or one of the following Advanced Practice Providers on your designated Care Team:    Kerin Ransom, PA-C  [place], [place]  Coletta Memos, [place]   Your physician wants you to follow-up in: 1 year. You will receive a reminder letter in the mail two months in advance. If you don't receive a letter, please call our office to schedule the follow-up appointment.

## 2019-07-25 NOTE — Assessment & Plan Note (Signed)
History of right renal artery stenting in the past by myself with recent renal Dopplers performed 05/27/2019 showing a widely patent renal stent.  This will be repeated on annual basis.

## 2019-07-25 NOTE — Assessment & Plan Note (Signed)
History of hyperlipidemia on Vytorin with lipid profile performed 07/16/2019 revealing total cholesterol 141, LDL 71 and HDL 52.

## 2019-07-25 NOTE — Assessment & Plan Note (Signed)
History of carotid artery disease status post elective right carotid endarterectomy by Dr. Sherren Mocha Early in 2007 with recent Dopplers performed 05/27/2019 revealing this to be widely patent.

## 2019-07-25 NOTE — Assessment & Plan Note (Signed)
History of CAD status post posterior lateral branch stenting by myself back in 2001 with ultimate in-stent restenosis with 3 dilatation.  He ultimately underwent CABG x4 after cardiac catheterization performed Dr. Claiborne Billings 12/24/2009 revealed left main/three-vessel disease.  He had a LIMA to the LAD, left radial to a diagonal branch and sequential vein to the PDA and PLA system.  Cath performed 10/02/2010 showed an occluded left radial graft to a diagonal branch, patent LIMA to the LAD and patent sequential vein to the PDA and PLA with normal LV function.  He denies chest pain or shortness of breath.

## 2019-07-25 NOTE — Assessment & Plan Note (Signed)
History of essential hypertension with blood pressure measured today 140/82.  He is on hydralazine and metoprolol as well as ramipril.

## 2019-07-25 NOTE — Progress Notes (Signed)
07/25/2019 [name]   1955-12-23  [id number]  Primary Physician Maurice Neer, MD Primary Cardiologist: Maurice Harp MD Maurice Park, [place], [place]  HPI:  Maurice Park is a 63 y.o.  widowed (wife died from [place] Mar 16, 2012) [place] male, father of 1 son Maurice Park) who I last saw  his son is about to graduate from [employer] moved to [place] where he plans to study prosthetics.  08/06/2018.  He remarried to Maurice Park, who works as a Careers adviser at a [employer], [employer] and exercise.  He has a history of CAD and PVOD. I stented his posterolateral branch back February 21, 2000. He had moderate LAD and circumflex disease at that time. He was recatheterization in 2011 revealing 60% "in-stent restenosis" of his PLA stent which I redilated. He also had right renal artery stenosis which I stented as well. Dr. Sherren Park Early performed elective right carotid endarterectomy on him in 2007 which we follow by duplex ultrasound. He developed crescendo angina and was catheterized by Dr. Ellouise Park December 24, 2009, revealing left main 3-vessel disease. He underwent coronary artery bypass grafting x4 December 20, 2009, with a LIMA to his LAD, a left radial to a diagonal branch, sequential vein to the PDA and PLA system. His postop course was complicated by prolonged paroxysmal atrial fibrillation which he recuperated from nicely. <BR><BR>He had a Myoview stress test performed March 22, 2010, which was nonischemic. Renal Dopplers continue to show widely patent right renal artery stent and carotid Dopplers show patent endarterectomy site. He denies chest pain or shortness of breath. He was catheterized October 02, 2010, revealing an occluded left radial graft to a diagonal branch, patent LIMA to the LAD and a patent sequential vein to the PDA and PLA with normal LV function and patent right renal artery stent. His last lipid profile was a year ago. Since I saw him 18 months ago he's remained completely stable. He is working out with a  Clinical research associate 2 days a week. His son Maurice Park is now 71years old and has gotten into [employer]..his wife of 25 years, Maurice Park, died March 11, 2012 of ALS.He remarried to his current wife Maurice Park on 01/29/16.  Since I saw him a year ago he is remained stable. He has worked from home since March during [place].  He is put on a few pounds and is less active than he has been in the past.  He was working out at [employer] with a trainer log along with his wife Maurice Park prior to the pandemic.  He still plays tennis and is fairly active.  He recently tore his hamstring.  He denies chest pain or shortness of breath.  T.    Current Meds  Medication Sig   aspirin EC 81 MG tablet Take 81 mg by mouth at bedtime.   ezetimibe-simvastatin (VYTORIN) 10-20 MG tablet TAKE 1 TABLET BY MOUTH EVERY DAY   hydrALAZINE (APRESOLINE) 25 MG tablet TAKE 1 TABLET BY MOUTH TWICE A DAY   metoprolol succinate (TOPROL-XL) 50 MG 24 hr tablet TAKE 1 TABLET DAILY WITH OR IMMEDIATELY FOLLOWING A MEAL   nitroGLYCERIN (NITROSTAT) 0.4 MG SL tablet Place 1 tablet (0.4 mg total) under the tongue every 5 (five) minutes as needed for up to 25 days for chest pain.   ramipril (ALTACE) 5 MG capsule TAKE 1 CAPSULE DAILY   ranolazine (RANEXA) 500 MG 12 hr tablet TAKE 1 TABLET BY MOUTH TWICE A DAY   VIAGRA 100 MG tablet  Take 1 tablet by mouth as directed.   [DISCONTINUED] nitroGLYCERIN (NITROSTAT) 0.4 MG SL tablet Place 1 tablet (0.4 mg total) under the tongue every 5 (five) minutes as needed for up to 25 days for chest pain.     Allergies  Allergen Reactions   Hydrocortisone Hives   Codeine Nausea Only   Contrast Media [Iodinated Diagnostic Agents] Hives   Neosporin [Neomycin-Polymyxin-Gramicidin] Rash    Social History   Socioeconomic History   Marital status: Married    Spouse name: Not on file   Number of children: 1   Years of education: BSBA   Highest education level: Not on file  Occupational History    Not on file  Social Needs   Financial resource strain: Not on file   Food insecurity    Worry: Not on file    Inability: Not on file   Transportation needs    Medical: Not on file    Non-medical: Not on file  Tobacco Use   Smoking status: Former Smoker    Packs/day: 1.00    Years: 12.00    Pack years: 12.00    Types: Cigarettes    Quit date: 11/09/1981    Years since quitting: 37.7   Smokeless tobacco: Never Used  Substance and Sexual Activity   Alcohol use: Yes    Alcohol/week: 3.0 standard drinks    Types: 3 Cans of beer per week    Comment: last drink was in november   Drug use: No   Sexual activity: Not Currently    Comment: Right Carotid endarectomy  Lifestyle   Physical activity    Days per week: Not on file    Minutes per session: Not on file   Stress: Not on file  Relationships   Social connections    Talks on phone: Not on file    Gets together: Not on file    Attends religious service: Not on file    Active member of club or organization: Not on file    Attends meetings of clubs or organizations: Not on file    Relationship status: Not on file   Intimate partner violence    Fear of current or ex partner: Not on file    Emotionally abused: Not on file    Physically abused: Not on file    Forced sexual activity: Not on file  Other Topics Concern   Not on file  Social History Narrative   Not on file     Review of Systems: General: negative for chills, fever, night sweats or weight changes.  Cardiovascular: negative for chest pain, dyspnea on exertion, edema, orthopnea, palpitations, paroxysmal nocturnal dyspnea or shortness of breath Dermatological: negative for rash Respiratory: negative for cough or wheezing Urologic: negative for hematuria Abdominal: negative for nausea, vomiting, diarrhea, bright red blood per rectum, melena, or hematemesis Neurologic: negative for visual changes, syncope, or dizziness All other systems reviewed and are  otherwise negative except as noted above.    Blood pressure 140/82, pulse (!) 54, height 5' 4.5" (1.638 m), weight 187 lb 4.8 oz (85 kg).  General appearance: alert and no distress Neck: no adenopathy, no carotid bruit, no JVD, supple, symmetrical, trachea midline and thyroid not enlarged, symmetric, no tenderness/mass/nodules Lungs: clear to auscultation bilaterally Heart: regular rate and rhythm, S1, S2 normal, no murmur, click, rub or gallop Extremities: extremities normal, atraumatic, no cyanosis or edema Pulses: 2+ and symmetric Skin: Skin color, texture, turgor normal. No rashes or lesions Neurologic: Alert and  oriented X 3, normal strength and tone. Normal symmetric reflexes. Normal coordination and gait  EKG sinus bradycardia 54 without ST or T wave changes.  I personally reviewed this EKG.  ASSESSMENT AND PLAN:   CAD (coronary artery disease), with CABG [place] to LAD, Lt. radial artery to 1st diagnal, sequential SVG to PDA and PLA, 12/20/09 History of CAD status post posterior lateral branch stenting by myself back in 2001 with ultimate in-stent restenosis with 3 dilatation.  He ultimately underwent CABG x4 after cardiac catheterization performed Dr. Tresa EndoKelly 12/24/2009 revealed left main/three-vessel disease.  He had a LIMA to the LAD, left radial to a diagonal branch and sequential vein to the PDA and PLA system.  Cath performed 10/02/2010 showed an occluded left radial graft to a diagonal branch, patent LIMA to the LAD and patent sequential vein to the PDA and PLA with normal LV function.  He denies chest pain or shortness of breath.  Hyperlipidemia, controlled History of hyperlipidemia on Vytorin with lipid profile performed 07/16/2019 revealing total cholesterol 141, LDL 71 and HDL 52.  Renal artery stenosis History of right renal artery stenting in the past by myself with recent renal Dopplers performed 05/27/2019 showing a widely patent renal stent.  This will be repeated on annual  basis.  Carotid artery disease History of carotid artery disease status post elective right carotid endarterectomy by Dr. Tawanna Coolerodd Early in 2007 with recent Dopplers performed 05/27/2019 revealing this to be widely patent.  Hypertension History of essential hypertension with blood pressure measured today 140/82.  He is on hydralazine and metoprolol as well as ramipril.      Runell GessJonathan J. Ioannis Schuh MD FACP,FACC,FAHA, [facility] 07/25/2019 8:47 AM

## 2019-09-15 ENCOUNTER — Other Ambulatory Visit: Payer: Self-pay | Admitting: Cardiovascular Disease

## 2019-09-15 ENCOUNTER — Other Ambulatory Visit: Payer: Self-pay

## 2019-09-15 MED ORDER — RAMIPRIL 5 MG PO CAPS: 5.0000 mg | ORAL_CAPSULE | Freq: Every day | ORAL | 3 refills | Status: DC

## 2019-09-15 MED ORDER — EZETIMIBE-SIMVASTATIN 10-20 MG PO TABS: 1.0000 | ORAL_TABLET | Freq: Every day | ORAL | 3 refills | Status: DC

## 2019-10-24 ENCOUNTER — Telehealth: Payer: Self-pay | Admitting: Cardiovascular Disease

## 2019-10-24 NOTE — Telephone Encounter (Signed)
We are recommending the COVID-19 vaccine to all of our patients. Cardiac medications (including blood thinners) should not deter anyone from being vaccinated and there is no need to hold any of those medications prior to vaccine administration.     Currently, there is a hotline to call (active 09/19/19) to schedule vaccination appointments as no walk-ins will be accepted.   Number: [phone].    If an appointment is not available please go to [email or web address] to sign up for notification when additional vaccine appointments are available.   If you have further questions or concerns about the vaccine process, please visit [email or web address] or contact your primary care physician.   

## 2019-11-17 ENCOUNTER — Other Ambulatory Visit: Payer: Self-pay | Admitting: Cardiovascular Disease

## 2019-11-17 NOTE — Telephone Encounter (Signed)
*  STAT* If patient is at the pharmacy, call can be transferred to refill team.   1. Which medications need to be refilled? (please list name of each medication and dose if known) hydrALAZINE (APRESOLINE) 25 MG tablet  2. Which pharmacy/location (including street and city if local pharmacy) is medication to be sent to? [id number] - [place], [place] - [address] [address]  3. Do they need a 30 day or 90 day supply? 90 day supply

## 2019-11-18 MED ORDER — HYDRALAZINE HCL 25 MG PO TABS: 25.0000 mg | ORAL_TABLET | Freq: Two times a day (BID) | ORAL | 3 refills | Status: DC

## 2019-11-18 NOTE — Telephone Encounter (Signed)
Follow Up  Patient is calling back in to get refill sent over to pharmacy, states that he is out of medication and needs prescription sent to the pharmacy today. Please assist.

## 2019-11-18 NOTE — Telephone Encounter (Signed)
[facility] and pt notified.

## 2020-03-03 ENCOUNTER — Other Ambulatory Visit: Payer: Self-pay | Admitting: Cardiovascular Disease

## 2020-03-29 ENCOUNTER — Other Ambulatory Visit: Payer: Self-pay | Admitting: Physician Assistant

## 2020-07-13 ENCOUNTER — Ambulatory Visit (HOSPITAL_BASED_OUTPATIENT_CLINIC_OR_DEPARTMENT_OTHER)
Admission: RE | Admit: 2020-07-13 | Discharge: 2020-07-13 | Disposition: A | Discharge status: Home / Self Care | Payer: 59 | Source: Ambulatory Visit | Attending: Cardiovascular Disease | Admitting: Cardiovascular Disease

## 2020-07-13 ENCOUNTER — Ambulatory Visit (HOSPITAL_COMMUNITY)
Admission: RE | Admit: 2020-07-13 | Discharge: 2020-07-13 | Disposition: A | Discharge status: Home / Self Care | Payer: 59 | Source: Ambulatory Visit | Attending: Cardiovascular Disease | Admitting: Cardiovascular Disease

## 2020-07-13 ENCOUNTER — Other Ambulatory Visit (HOSPITAL_COMMUNITY): Payer: Self-pay | Admitting: Cardiovascular Disease

## 2020-07-13 ENCOUNTER — Other Ambulatory Visit: Payer: Self-pay

## 2020-07-13 DIAGNOSIS — I701 Atherosclerosis of renal artery: Secondary | ICD-10-CM | POA: Insufficient documentation

## 2020-07-13 DIAGNOSIS — Z9889 Other specified postprocedural states: Secondary | ICD-10-CM

## 2020-07-13 DIAGNOSIS — I6522 Occlusion and stenosis of left carotid artery: Secondary | ICD-10-CM

## 2020-07-13 DIAGNOSIS — I6523 Occlusion and stenosis of bilateral carotid arteries: Secondary | ICD-10-CM

## 2020-07-20 ENCOUNTER — Other Ambulatory Visit: Payer: Self-pay

## 2020-07-20 DIAGNOSIS — I701 Atherosclerosis of renal artery: Secondary | ICD-10-CM

## 2020-07-20 DIAGNOSIS — I6521 Occlusion and stenosis of right carotid artery: Secondary | ICD-10-CM

## 2020-07-20 NOTE — Progress Notes (Signed)
vas 

## 2020-08-03 LAB — LIPID PANEL
Cholesterol: 133 (ref 0–200)
HDL: 48 (ref 35–70)
LDL Cholesterol: 65
Triglycerides: 103 (ref 40–160)

## 2020-08-03 LAB — PSA: PSA: 1.59

## 2020-08-03 LAB — BASIC METABOLIC PANEL
BUN: 12 (ref 4–21)
Creatinine: 1.1 (ref 0.6–1.3)
Glucose: 123

## 2020-08-03 LAB — HEMOGLOBIN A1C: Hemoglobin A1C: 5.9

## 2020-08-03 LAB — MICROALBUMIN, URINE: Microalb, Ur: 0.75

## 2020-08-28 ENCOUNTER — Other Ambulatory Visit: Payer: Self-pay | Admitting: Cardiovascular Disease

## 2020-09-13 ENCOUNTER — Other Ambulatory Visit: Payer: Self-pay | Admitting: Cardiovascular Disease

## 2020-09-16 ENCOUNTER — Other Ambulatory Visit: Payer: Self-pay

## 2020-09-16 ENCOUNTER — Ambulatory Visit (INDEPENDENT_AMBULATORY_CARE_PROVIDER_SITE_OTHER): Payer: 59 | Admitting: Family Medicine

## 2020-09-16 ENCOUNTER — Encounter (INDEPENDENT_AMBULATORY_CARE_PROVIDER_SITE_OTHER): Payer: Self-pay | Admitting: Family Medicine

## 2020-09-16 ENCOUNTER — Other Ambulatory Visit (INDEPENDENT_AMBULATORY_CARE_PROVIDER_SITE_OTHER): Payer: Self-pay | Admitting: Family Medicine

## 2020-09-16 ENCOUNTER — Encounter (INDEPENDENT_AMBULATORY_CARE_PROVIDER_SITE_OTHER): Payer: Self-pay

## 2020-09-16 VITALS — BP 131/78 | HR 52 | Temp 97.9°F | Ht 64.0 in | Wt 186.0 lb

## 2020-09-16 DIAGNOSIS — Z6832 Body mass index (BMI) 32.0-32.9, adult: Secondary | ICD-10-CM

## 2020-09-16 DIAGNOSIS — E7849 Other hyperlipidemia: Secondary | ICD-10-CM

## 2020-09-16 DIAGNOSIS — I10 Essential (primary) hypertension: Secondary | ICD-10-CM

## 2020-09-16 DIAGNOSIS — R0602 Shortness of breath: Secondary | ICD-10-CM

## 2020-09-16 DIAGNOSIS — Z1331 Encounter for screening for depression: Secondary | ICD-10-CM | POA: Diagnosis not present

## 2020-09-16 DIAGNOSIS — E65 Localized adiposity: Secondary | ICD-10-CM

## 2020-09-16 DIAGNOSIS — Z9189 Other specified personal risk factors, not elsewhere classified: Secondary | ICD-10-CM | POA: Diagnosis not present

## 2020-09-16 DIAGNOSIS — G4733 Obstructive sleep apnea (adult) (pediatric): Secondary | ICD-10-CM

## 2020-09-16 DIAGNOSIS — R5383 Other fatigue: Secondary | ICD-10-CM

## 2020-09-16 DIAGNOSIS — E669 Obesity, unspecified: Secondary | ICD-10-CM

## 2020-09-16 DIAGNOSIS — Z9989 Dependence on other enabling machines and devices: Secondary | ICD-10-CM

## 2020-09-16 DIAGNOSIS — M25551 Pain in right hip: Secondary | ICD-10-CM

## 2020-09-16 DIAGNOSIS — I2510 Atherosclerotic heart disease of native coronary artery without angina pectoris: Secondary | ICD-10-CM

## 2020-09-16 DIAGNOSIS — F5089 Other specified eating disorder: Secondary | ICD-10-CM

## 2020-09-16 DIAGNOSIS — Z0289 Encounter for other administrative examinations: Secondary | ICD-10-CM

## 2020-09-16 NOTE — Progress Notes (Signed)
Chief Complaint:   OBESITY Maurice Park (MR# [id number]) is a 65 y.o. male who presents for evaluation and treatment of obesity and related comorbidities. Current BMI is Body mass index is 31.93 kg/m. Maurice Park has been struggling with his weight for many years and has been unsuccessful in either losing weight, maintaining weight loss, or reaching his healthy weight goal.  Maurice Park is currently in the action stage of change and ready to dedicate time achieving and maintaining a healthier weight. Maurice Park is interested in becoming our patient and working on intensive lifestyle modifications including (but not limited to) diet and exercise for weight loss.  Maurice Park is lactose intolerant.  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  He lives with his domestic partner, Maurice Park.  For exercise, he plays tennis.  He says that he binge eats at times.  Maurice Park's habits were reviewed today and are as follows: His family eats meals together, he thinks his family will eat healthier with him, his desired weight loss is 30 pounds, he started gaining weight when he turned 61, his heaviest weight ever was 185+ pounds, he craves desserts, chocolate, fries, and Diet Coke, he snacks frequently in the evenings, he skips breakfast frequently, he is frequently drinking liquids with calories, he frequently makes poor food choices and he struggles with emotional eating.  Depression Screen Maurice Park's Food and Mood (modified PHQ-9) score was 6.  Depression screen PHQ 2/9 09/16/2020  Decreased Interest 1  Down, Depressed, Hopeless 1  PHQ - 2 Score 2  Altered sleeping 0  Tired, decreased energy 3  Change in appetite 1  Feeling bad or failure about yourself  0  Trouble concentrating 0  Moving slowly or fidgety/restless 0  Suicidal thoughts 0  PHQ-9 Score 6  Difficult doing work/chores Not difficult at all   Assessment/Plan:   1. Other fatigue Kwali denies daytime somnolence and  reports waking up still tired. Patent has a history of symptoms of morning fatigue and snoring. Maurice Park generally gets 6 or 7 hours of sleep per night, and states that he has generally restful sleep. Snoring is present. Apneic episodes are present. Epworth Sleepiness Score is 3.  Maurice Park does feel that his weight is causing his energy to be lower than it should be. Fatigue may be related to obesity, depression or many other causes. Labs will be ordered, and in the meanwhile, Advay will focus on self care including making healthy food choices, increasing physical activity and focusing on stress reduction.  - EKG 12-Lead  2. SOB (shortness of breath) on exertion Maurice Park notes increasing shortness of breath with exercising and seems to be worsening over time with weight gain. He notes getting out of breath sooner with activity than he used to. This has gotten worse recently. Maurice Park denies shortness of breath at rest or orthopnea.  [name] does feel that he gets out of breath more easily that he used to when he exercises. Maurice Park's shortness of breath appears to be obesity related and exercise induced. He has agreed to work on weight loss and gradually increase exercise to treat his exercise induced shortness of breath. Will continue to monitor closely.  - EKG 12-Lead  3. Visceral obesity Current visceral fat rating: 17. Visceral fat rating should be < 13. Visceral adipose tissue is a hormonally active component of total body fat. This body composition phenotype is associated with medical disorders such as metabolic syndrome, cardiovascular disease and several malignancies  including prostate, breast, and colorectal cancers. Starting goal: Lose 7-10% of starting weight.   4. 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  He is taking ranolazine 500 mg twice daily and nitroglycerine as needed.  5. Essential  hypertension At goal.  With renal artery stenosis.  Medications: ramipril, metoprolol, and hydralazine.   Plan: Avoid buying foods that are: processed, frozen, or prepackaged to avoid excess salt. We will continue to monitor symptoms as they relate to his weight loss journey.  BP Readings from Last 3 Encounters:  09/16/20 131/78  07/25/19 140/82  08/06/18 (!) 144/78   Lab Results  Component Value Date   CREATININE 1.1 08/03/2020   6. Other hyperlipidemia Lipid-lowering medications: Vytorin 10-20 mg daily.   Plan: Dietary changes: Increase soluble fiber. Decrease simple carbohydrates. Exercise changes: An average 40 minutes of moderate to vigorous-intensity aerobic activity 3 or 4 times per week.   Lab Results  Component Value Date   CHOL 133 08/03/2020   HDL 48 08/03/2020   LDLCALC 65 08/03/2020   TRIG 103 08/03/2020   CHOLHDL 2.7 07/15/2019   Lab Results  Component Value Date   ALT 39 07/15/2019   AST 26 07/15/2019   ALKPHOS 66 07/15/2019   BILITOT 0.7 07/15/2019   The 10-year ASCVD risk score Maurice George DC Jr., et al., 2013) is: 9.9%*   Values used to calculate the score:     Age: 59 years     Sex: Male     Is Non-Hispanic African American: No     Diabetic: No     Tobacco smoker: No     Systolic Blood Pressure: 131 mmHg     Is BP treated: Yes     HDL Cholesterol: 48 mg/dL*     Total Cholesterol: 133 mg/dL*     * - Cholesterol units were assumed for this score calculation  7. Right hip pain Will follow because mobility and pain control are important for weight management.  8. OSA on CPAP Kristin uses a CPAP with nasal pillows.  OSA is a cause of systemic hypertension and is associated with an increased incidence of stroke, heart failure, atrial fibrillation, and coronary heart disease. Severe OSA increases all-cause mortality and  cardiovascular mortality.   Goal: Treatment of OSA via CPAP compliance and weight loss. . Plasma ghrelin levels (appetite or "hunger  hormone") are significantly higher in OSA patients than in BMI-matched controls, but decrease to levels similar to those of obese patients without OSA after CPAP treatment.  . Weight loss improves OSA by several mechanisms, including reduction in fatty tissue in the throat (i.e. parapharyngeal fat) and the tongue. Loss of abdominal fat increases mediastinal traction on the upper airway making it less likely to collapse during sleep. . Studies have also shown that compliance with CPAP treatment improves leptin (hunger inhibitory hormone) imbalance.  9. Other disorder of eating Tiger tends to eat late at night/binge.   10. Depression screening Law was screened for depression as part of his new patient workup today.  PHQ-9 is 6.  11. At risk for heart disease Due to Auryn's current state of health and medical condition(s), he is at a higher risk for heart disease.   This puts the patient at much greater risk to subsequently develop cardiopulmonary conditions that can significantly affect patient's quality of life in a negative manner as well.    At least 10 minutes was  spent on counseling R[place]hard about these concerns today. Initial goal is to lose at least 5-10% of starting weight to help reduce these risk factors.  We will continue to reassess these conditions on a fairly regular basis in an attempt to decrease patient's overall morbidity and mortality.  Evidence-based interventions for health behavior change were utilized today including the discussion of self monitoring techniques, problem-solving barriers and SMART goal setting techniques.  Specifically regarding patient's less desirable eating habits and patterns, we employed the technique of small changes when Shamal has not been able to fully commit to his prudent nutritional plan.  12. Class 1 obesity with serious comorbidity and body mass index (BMI) of 32.0 to 32.9 in adult, unspecified obesity type  Kagan is currently in the action stage of  change and his goal is to continue with weight loss efforts. I recommend Marek begin the structured treatment plan as follows:  He has agreed to the Category 2 Plan.  Exercise goals: As is.   Behavioral modification strategies: increasing lean protein intake, decreasing simple carbohydrates, increasing vegetables, increasing water intake, decreasing liquid calories, decreasing alcohol intake and decreasing sodium intake.  He was informed of the importance of frequent follow-up visits to maximize his success with intensive lifestyle modifications for his multiple health conditions. He was informed we would discuss his lab results at his next visit unless there is a critical issue that needs to be addressed sooner. Sergei agreed to keep his next visit at the agreed upon time to discuss these results.  Objective:   Blood pressure 131/78, pulse (!) 52, temperature 97.9 F (36.6 C), temperature source Oral, height 5\' 4"  (1.626 m), weight 186 lb (84.4 kg), SpO2 95 %. Body mass index is 31.93 kg/m.  EKG: Normal sinus rhythm, rate 56 bpm.  Indirect Calorimeter completed today shows a VO2 of 234 and a REE of 1630.  His calculated basal metabolic rate is thus his basal metabolic rate is worse than expected.  General: Cooperative, alert, well developed, in no acute distress. HEENT: Conjunctivae and lids unremarkable. Cardiovascular: Regular rhythm.  Lungs: Normal work of breathing. Neurologic: No focal deficits.   Lab Results  Component Value Date   CREATININE 1.1 08/03/2020   BUN 12 08/03/2020   NA 143 07/02/2018   K 4.1 07/02/2018   CL 103 07/02/2018   CO2 27 07/02/2018   Lab Results  Component Value Date   ALT 39 07/15/2019   AST 26 07/15/2019   ALKPHOS 66 07/15/2019   BILITOT 0.7 07/15/2019   Lab Results  Component Value Date   HGBA1C 5.9 08/03/2020   HGBA1C 5.9 (H) 10/02/2011   Lab Results  Component Value Date   TSH 1.263 10/02/2011   Lab Results  Component Value  Date   CHOL 133 08/03/2020   HDL 48 08/03/2020   LDLCALC 65 08/03/2020   TRIG 103 08/03/2020   CHOLHDL 2.7 07/15/2019   Lab Results  Component Value Date   WBC 5.4 07/14/2013   HGB 15.7 07/14/2013   HCT 45.9 07/14/2013   MCV 94.8 07/14/2013   PLT 215 07/14/2013   Attestation Statements:   Reviewed by clinician on day of visit: allergies, medications, problem list, medical history, surgical history, family history, social history, and previous encounter notes.  This is the patient's first visit at [facility]. The patient's NEW PATIENT PACKET was reviewed at length. Included in the packet: current and past health history, medications, allergies, ROS, gynecologic history (women only), surgical history,  family history, social history, weight history, weight loss surgery history (for those that have had weight loss surgery), nutritional evaluation, mood and food questionnaire, PHQ9, Epworth questionnaire, sleep habits questionnaire, patient life and health improvement goals questionnaire. These will all be scanned into the patient's chart under media.   During the visit, I independently reviewed the patient's EKG, bioimpedance scale results, and indirect calorimeter results. I used this information to tailor a meal plan for the patient that will help him to lose weight and will improve his obesity-related conditions going forward. I performed a medically necessary appropriate examination and/or evaluation. I discussed the assessment and treatment plan with the patient. The patient was provided an opportunity to ask questions and all were answered. The patient agreed with the plan and demonstrated an understanding of the instructions. Labs were ordered at this visit and will be reviewed at the next visit unless more critical results need to be addressed immediately. Clinical information was updated and documented in the EMR.   I, Water quality scientist, CMA, am acting as transcriptionist for  Briscoe Deutscher, DO  I have reviewed the above documentation for accuracy and completeness, and I agree with the above. Briscoe Deutscher, DO

## 2020-09-30 ENCOUNTER — Encounter (INDEPENDENT_AMBULATORY_CARE_PROVIDER_SITE_OTHER): Payer: Self-pay | Admitting: Family Medicine

## 2020-09-30 ENCOUNTER — Other Ambulatory Visit: Payer: Self-pay

## 2020-09-30 ENCOUNTER — Ambulatory Visit (INDEPENDENT_AMBULATORY_CARE_PROVIDER_SITE_OTHER): Payer: 59 | Admitting: Family Medicine

## 2020-09-30 VITALS — BP 135/80 | HR 53 | Temp 97.5°F | Ht 64.0 in | Wt 179.0 lb

## 2020-09-30 DIAGNOSIS — I2510 Atherosclerotic heart disease of native coronary artery without angina pectoris: Secondary | ICD-10-CM | POA: Diagnosis not present

## 2020-09-30 DIAGNOSIS — F5089 Other specified eating disorder: Secondary | ICD-10-CM

## 2020-09-30 DIAGNOSIS — E7849 Other hyperlipidemia: Secondary | ICD-10-CM | POA: Diagnosis not present

## 2020-09-30 DIAGNOSIS — I10 Essential (primary) hypertension: Secondary | ICD-10-CM | POA: Diagnosis not present

## 2020-09-30 DIAGNOSIS — E8881 Metabolic syndrome: Secondary | ICD-10-CM

## 2020-09-30 DIAGNOSIS — Z9189 Other specified personal risk factors, not elsewhere classified: Secondary | ICD-10-CM | POA: Diagnosis not present

## 2020-09-30 DIAGNOSIS — Z6830 Body mass index (BMI) 30.0-30.9, adult: Secondary | ICD-10-CM

## 2020-09-30 DIAGNOSIS — E669 Obesity, unspecified: Secondary | ICD-10-CM

## 2020-10-04 NOTE — Progress Notes (Signed)
Chief Complaint:   OBESITY Maurice Park is here to discuss his progress with his obesity treatment plan along with follow-up of his obesity related diagnoses.   Today's visit was #: 2 Starting weight: 186 lbs Starting date: 09/16/2020 Today's weight: 179 lbs Today's date: 09/30/2020 Total lbs lost to date: 7 lbs Body mass index is 30.73 kg/m.  Total weight loss percentage to date: -3.76%  Interim History: Maurice Park is happily surprised with his weight loss.  He denies polyphagia or cravings. Nutrition Plan: the Category 2 Plan for 80-90% of the time.  Hunger is well controlled. Cravings are well controlled.  Activity: Playing tennis for 90 minutes 1 time per week.  Assessment/Plan:   1. Essential hypertension At goal.  With renal artery stenosis.  Medications: ramipril, metoprolol, and hydralazine.   Plan: Avoid buying foods that are: processed, frozen, or prepackaged to avoid excess salt. We will continue to monitor symptoms as they relate to his weight loss journey.  BP Readings from Last 3 Encounters:  09/30/20 135/80  09/16/20 131/78  07/25/19 140/82   Lab Results  Component Value Date   CREATININE 1.1 08/03/2020   2. 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  He is taking ranolazine 500 mg twice daily and nitroglycerine as needed.  3. Metabolic syndrome, visceral obesity Starting goal: Lose 7-10% of starting weight.   Current visceral fat rating: 16. Visceral fat rating should be < 13. Visceral adipose tissue is a hormonally active component of total body fat. This body composition phenotype is associated with medical disorders such as metabolic syndrome, cardiovascular disease and several malignancies including prostate, breast, and colorectal cancers.   He will continue to focus on protein-rich, low simple carbohydrate foods. We  reviewed the importance of hydration, regular exercise for stress reduction, and restorative sleep.  We will continue to check lab work every 3 months, with 10% weight loss, or should any other concerns arise.  4. Other hyperlipidemia Lipid-lowering medications: Vytorin 10-20 mg daily. Plan: Dietary changes: Increase soluble fiber. Decrease simple carbohydrates. Exercise changes: An average 40 minutes of moderate to vigorous-intensity aerobic activity 3 or 4 times per week.   Lab Results  Component Value Date   CHOL 133 08/03/2020   HDL 48 08/03/2020   LDLCALC 65 08/03/2020   TRIG 103 08/03/2020   CHOLHDL 2.7 07/15/2019   Lab Results  Component Value Date   ALT 39 07/15/2019   AST 26 07/15/2019   ALKPHOS 66 07/15/2019   BILITOT 0.7 07/15/2019   The 10-year ASCVD risk score [name], et al., 2013) is: 10.4%*   Values used to calculate the score:     Age: 65 years     Sex: Male     Is Non-Hispanic African American: No     Diabetic: No     Tobacco smoker: No     Systolic Blood Pressure: 135 mmHg     Is BP treated: Yes     HDL Cholesterol: 48 mg/dL*     Total Cholesterol: 133 mg/dL*     * - Cholesterol units were assumed for this score calculation  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 Already improving. Discussed cues and consequences, how thoughts affect eating, model of thoughts, feelings, and behaviors, and strategies for change by focusing on the cue. Discussed cognitive distortions, coping thoughts, and how to change your thoughts.  6.  At risk for heart disease Due to Maurice Park's current state of health and medical condition(s), he is at a higher risk for heart disease.   This puts the patient at much greater risk to subsequently develop cardiopulmonary conditions that can significantly affect patient's quality of life in a negative manner as well.  At least 8 minutes was spent on counseling Maurice Park about these concerns today.   Evidence-based interventions for  health behavior change were utilized today including the discussion of self monitoring techniques, problem-solving barriers and SMART goal setting techniques.  Specifically regarding patient's less desirable eating habits and patterns, we employed the technique of small changes when Maurice Park has not been able to fully commit to his prudent nutritional plan.  7. Class 1 obesity with serious comorbidity and body mass index (BMI) of 30.0 to 30.9 in adult, unspecified obesity type  Course: Maurice Park is currently in the action stage of change. As such, his goal is to continue with weight loss efforts.   Nutrition goals: He has agreed to the Category 2 Plan.   Exercise goals: As is.  Behavioral modification strategies: increasing lean protein intake, decreasing simple carbohydrates, increasing vegetables, increasing water intake and ways to avoid night time snacking.  Maurice Park has agreed to follow-up with our clinic in 2 weeks. He was informed of the importance of frequent follow-up visits to maximize his success with intensive lifestyle modifications for his multiple health conditions.   Objective:   Blood pressure 135/80, pulse (!) 53, temperature (!) 97.5 F (36.4 C), temperature source Maurice Park, height 5\' 4"  (1.626 m), weight 179 lb (81.2 kg), SpO2 98 %. Body mass index is 30.73 kg/m.  General: Cooperative, alert, well developed, in no acute distress. HEENT: Conjunctivae and lids unremarkable. Cardiovascular: Regular rhythm.  Lungs: Normal work of breathing. Neurologic: No focal deficits.   Lab Results  Component Value Date   CREATININE 1.1 08/03/2020   BUN 12 08/03/2020   NA 143 07/02/2018   K 4.1 07/02/2018   CL 103 07/02/2018   CO2 27 07/02/2018   Lab Results  Component Value Date   ALT 39 07/15/2019   AST 26 07/15/2019   ALKPHOS 66 07/15/2019   BILITOT 0.7 07/15/2019   Lab Results  Component Value Date   HGBA1C 5.9 08/03/2020   HGBA1C 5.9 (H) 10/02/2011   Lab Results  Component  Value Date   TSH 1.263 10/02/2011   Lab Results  Component Value Date   CHOL 133 08/03/2020   HDL 48 08/03/2020   LDLCALC 65 08/03/2020   TRIG 103 08/03/2020   CHOLHDL 2.7 07/15/2019   Lab Results  Component Value Date   WBC 5.4 07/14/2013   HGB 15.7 07/14/2013   HCT 45.9 07/14/2013   MCV 94.8 07/14/2013   PLT 215 07/14/2013   Attestation Statements:   Reviewed by clinician on day of visit: allergies, medications, problem list, medical history, surgical history, family history, social history, and previous encounter notes.  I, 13/11/2012, CMA, am acting as transcriptionist for Insurance claims handler, DO  I have reviewed the above documentation for accuracy and completeness, and I agree with the above. Helane Rima, DO

## 2020-10-14 ENCOUNTER — Encounter (INDEPENDENT_AMBULATORY_CARE_PROVIDER_SITE_OTHER): Payer: Self-pay | Admitting: Adult Health

## 2020-10-14 ENCOUNTER — Other Ambulatory Visit: Payer: Self-pay

## 2020-10-14 ENCOUNTER — Ambulatory Visit (INDEPENDENT_AMBULATORY_CARE_PROVIDER_SITE_OTHER): Payer: 59 | Admitting: Adult Health

## 2020-10-14 VITALS — BP 124/68 | HR 55 | Temp 97.2°F | Ht 64.0 in | Wt 177.0 lb

## 2020-10-14 DIAGNOSIS — I10 Essential (primary) hypertension: Secondary | ICD-10-CM | POA: Diagnosis not present

## 2020-10-14 DIAGNOSIS — R7303 Prediabetes: Secondary | ICD-10-CM

## 2020-10-14 DIAGNOSIS — E669 Obesity, unspecified: Secondary | ICD-10-CM

## 2020-10-14 DIAGNOSIS — Z6830 Body mass index (BMI) 30.0-30.9, adult: Secondary | ICD-10-CM

## 2020-10-18 DIAGNOSIS — R7303 Prediabetes: Secondary | ICD-10-CM | POA: Insufficient documentation

## 2020-10-18 DIAGNOSIS — E669 Obesity, unspecified: Secondary | ICD-10-CM | POA: Insufficient documentation

## 2020-10-18 DIAGNOSIS — E66811 Obesity, class 1: Secondary | ICD-10-CM | POA: Insufficient documentation

## 2020-10-18 DIAGNOSIS — Z6830 Body mass index (BMI) 30.0-30.9, adult: Secondary | ICD-10-CM | POA: Insufficient documentation

## 2020-10-18 NOTE — Progress Notes (Signed)
Chief Complaint:   OBESITY Maurice Park is here to discuss his progress with his obesity treatment plan along with follow-up of his obesity related diagnoses. Maurice Park is on the Category 2 Plan and states he is following his eating plan approximately 80% of the time. Maurice Park states he is doing cardio 60 minutes 3 times per week.  Today's visit was #: 3 Starting weight: 186 lbs Starting date: 09/16/2020 Today's weight: 177 lbs Today's date: 10/14/2020 Total lbs lost to date: 9 lbs Total lbs lost since last in-office visit: 2 lbs  Interim History: Maurice Park will be traveling to [place], [place] to ski in a few weeks. He has been preparing for the upcoming ski trip with rigorous cardio/HITT training- 3 x week. He was seen by his PCP/Dr. Farrel Demark on 10/13/20 Started on Trulicity and colonoscopy ordered. He has yet to start Trulicity.   Subjective:   1. Essential hypertension BP and heart rate are excellent at OV today. Pt is on Toprol XL 50 mg, Apresoline 25 mg BID, and Altace 5 mg daily.  BP Readings from Last 3 Encounters:  10/14/20 124/68  09/30/20 135/80  09/16/20 131/78    2. Pre-diabetes 08/03/2020 A1c 5.9 with elevated blood glucose of 123. Pt's PCP/Dr. Clelia Croft, recently started him on Trulicity- he has yet to start the GLP-1.  Lab Results  Component Value Date   HGBA1C 5.9 08/03/2020   No results found for: INSULIN  Assessment/Plan:   1. Essential hypertension Maurice Park is working on healthy weight loss and exercise to improve blood pressure control. We will watch for signs of hypotension as he continues his lifestyle modifications. Continue anti-hypertensive therapy. Check ambulatory BP and bring log to next OV.  2. Pre-diabetes Maurice Park will continue to work on weight loss, exercise, and decreasing simple carbohydrates to help decrease the risk of diabetes. Start Trulicity per Dr. Clelia Croft. Continue Category 2 meal plan and continue regular exercise.  3. Class 1 obesity with serious  comorbidity and body mass index (BMI) of 30.0 to 30.9 in adult, unspecified obesity type Maurice Park is currently in the action stage of change. As such, his goal is to continue with weight loss efforts. He has agreed to the Category 2 Plan.   Exercise goals: As is  Behavioral modification strategies: increasing lean protein intake, meal planning and cooking strategies and planning for success.  Maurice Park has agreed to follow-up with our clinic in 2 weeks. He was informed of the importance of frequent follow-up visits to maximize his success with intensive lifestyle modifications for his multiple health conditions.   Objective:   Blood pressure 124/68, pulse (!) 55, temperature (!) 97.2 F (36.2 C), height 5\' 4"  (1.626 m), weight 177 lb (80.3 kg), SpO2 97 %. Body mass index is 30.38 kg/m.  General: Cooperative, alert, well developed, in no acute distress. HEENT: Conjunctivae and lids unremarkable. Cardiovascular: Regular rhythm.  Lungs: Normal work of breathing. Neurologic: No focal deficits.   Lab Results  Component Value Date   CREATININE 1.1 08/03/2020   BUN 12 08/03/2020   NA 143 07/02/2018   K 4.1 07/02/2018   CL 103 07/02/2018   CO2 27 07/02/2018   Lab Results  Component Value Date   ALT 39 07/15/2019   AST 26 07/15/2019   ALKPHOS 66 07/15/2019   BILITOT 0.7 07/15/2019   Lab Results  Component Value Date   HGBA1C 5.9 08/03/2020   HGBA1C 5.9 (H) 10/02/2011   No results found for: INSULIN Lab Results  Component Value Date   TSH 1.263 10/02/2011   Lab Results  Component Value Date   CHOL 133 08/03/2020   HDL 48 08/03/2020   LDLCALC 65 08/03/2020   TRIG 103 08/03/2020   CHOLHDL 2.7 07/15/2019   Lab Results  Component Value Date   WBC 5.4 07/14/2013   HGB 15.7 07/14/2013   HCT 45.9 07/14/2013   MCV 94.8 07/14/2013   PLT 215 07/14/2013   No results found for: IRON, TIBC, FERRITIN   Attestation Statements:   Reviewed by clinician on day of visit: allergies,  medications, problem list, medical history, surgical history, family history, social history, and previous encounter notes.  Time spent on visit including pre-visit chart review and post-visit care and charting was 35 minutes.   Maurice Park, am acting as Energy manager for Maurice Hamburger, NP.  I have reviewed the above documentation for accuracy and completeness, and I agree with the above. -  Maurice Park d. Maurice Sahagun, NP-C

## 2020-10-28 ENCOUNTER — Other Ambulatory Visit: Payer: Self-pay

## 2020-10-28 ENCOUNTER — Encounter (INDEPENDENT_AMBULATORY_CARE_PROVIDER_SITE_OTHER): Payer: Self-pay | Admitting: Family Medicine

## 2020-10-28 ENCOUNTER — Ambulatory Visit (INDEPENDENT_AMBULATORY_CARE_PROVIDER_SITE_OTHER): Payer: 59 | Admitting: Family Medicine

## 2020-10-28 VITALS — BP 147/76 | HR 56 | Temp 97.9°F | Ht 64.0 in | Wt 178.0 lb

## 2020-10-28 DIAGNOSIS — I10 Essential (primary) hypertension: Secondary | ICD-10-CM

## 2020-10-28 DIAGNOSIS — R7303 Prediabetes: Secondary | ICD-10-CM | POA: Diagnosis not present

## 2020-10-28 DIAGNOSIS — E8881 Metabolic syndrome: Secondary | ICD-10-CM | POA: Diagnosis not present

## 2020-10-28 DIAGNOSIS — M940 Chondrocostal junction syndrome [Tietze]: Secondary | ICD-10-CM

## 2020-10-28 DIAGNOSIS — E669 Obesity, unspecified: Secondary | ICD-10-CM

## 2020-10-28 DIAGNOSIS — Z6830 Body mass index (BMI) 30.0-30.9, adult: Secondary | ICD-10-CM

## 2020-10-28 DIAGNOSIS — E65 Localized adiposity: Secondary | ICD-10-CM | POA: Diagnosis not present

## 2020-10-28 DIAGNOSIS — Z9189 Other specified personal risk factors, not elsewhere classified: Secondary | ICD-10-CM

## 2020-10-28 MED ORDER — ONETOUCH ULTRA VI STRP: ORAL_STRIP | 0 refills | Status: DC

## 2020-10-28 MED ORDER — ONETOUCH ULTRA 2 W/DEVICE KIT: PACK | 0 refills | Status: DC

## 2020-10-28 MED ORDER — OZEMPIC (0.25 OR 0.5 MG/DOSE) 2 MG/1.5ML ~~LOC~~ SOPN: 0.5000 mg | PEN_INJECTOR | SUBCUTANEOUS | 0 refills | Status: DC

## 2020-10-28 MED ORDER — ONETOUCH DELICA LANCETS 33G MISC: 0 refills | Status: DC

## 2020-11-03 NOTE — Progress Notes (Signed)
Chief Complaint:   OBESITY Maurice Park is here to discuss his progress with his obesity treatment plan along with follow-up of his obesity related diagnoses.   Today's visit was #: 4 Starting weight: 186 lbs Starting date: 09/16/2020 Today's weight: 178 lbs Today's date: 10/28/2020 Total lbs lost to date: 8 lbs Body mass index is 30.55 kg/m.  Total weight loss percentage to date: -4.30%  Interim History:  Maurice Park say he has been playing a lot of tennis and has been doing more snacking after exercise.  He has been drinking less water, he says.  He has been more active with a local band, playing guitar, which leads to eating more at night.   Current Meal Plan: the Category 2 Plan for 70% of the time.  Current Exercise Plan: Tennis for 60-90 minutes 3-4 times per week.  Assessment/Plan:   1. Prediabetes Not at goal. Goal is HgbA1c < 5.7.  Medication: None.  Trulicity was not approved by his insurance.    Plan:  He will continue to focus on protein-rich, low simple carbohydrate foods. We reviewed the importance of hydration, regular exercise for stress reduction, and restorative sleep.  Will send in glucometer and supplies today.  Lab Results  Component Value Date   HGBA1C 5.9 08/03/2020   - Blood Glucose Monitoring Suppl (ONE TOUCH ULTRA 2) w/Device KIT; Use to check Blood sugar 2 x daily DX: R73.03  Dispense: 1 kit; Refill: 0 - glucose blood (ONETOUCH ULTRA) test strip; Use to check Blood sugar 2 x daily DX: R73.03  Dispense: 100 each; Refill: 0 - OneTouch Delica Lancets 74Q MISC; Use to check Blood sugar 2 x daily DX: R73.03  Dispense: 100 each; Refill: 0  2. Essential hypertension Elevated today. Medications: metoprolol 50 mg daily, Apresoline 25 mg twice daily, Altace 5 mg daily.   Plan: Avoid buying foods that are: processed, frozen, or prepackaged to avoid excess salt. We will continue to monitor closely alongside his PCP and/or Specialist.  Regular follow up with PCP and  specialists was also encouraged.   BP Readings from Last 3 Encounters:  10/28/20 (!) 147/76  10/14/20 124/68  09/30/20 135/80   Lab Results  Component Value Date   CREATININE 1.1 08/03/2020   3. Metabolic syndrome Starting goal: Lose 7-10% of starting weight. He will continue to focus on protein-rich, low simple carbohydrate foods. We reviewed the importance of hydration, regular exercise for stress reduction, and restorative sleep.  We will continue to check lab work every 3 months, with 10% weight loss, or should any other concerns arise.  Plan:  Start Ozempic 0.25 mg subcutaneously weekly, as per below.  - Start Semaglutide,0.25 or 0.5MG/DOS, (OZEMPIC, 0.25 OR 0.5 MG/DOSE,) 2 MG/1.5ML SOPN; Inject 0.5 mg into the skin once a week.  Dispense: 1.5 mL; Refill: 0  4. Visceral obesity Current visceral fat rating: 16. Visceral fat rating should be < 13. Visceral adipose tissue is a hormonally active component of total body fat. This body composition phenotype is associated with medical disorders such as metabolic syndrome, cardiovascular disease and several malignancies including prostate, breast, and colorectal cancers. Starting goal: Lose 7-10% of starting weight.   5. Costochondritis New. Started after playing too much tennis. Discomfort with movement and palpation. No SOB, N/V. Discussed diagnosis, treatment, and precautions.   6. At risk for activity intolerance Maurice Park was given approximately 8 minutes of counseling today regarding his increased risk for exercise intolerance.  We discussed patient's specific personal and medical issues  that raise our concern.  He was advised of strategies to prevent injury and ways to improve his cardiopulmonary fitness levels slowly over time.  We additionally discussed various fitness trackers and smart phone apps to help motivate the patient to stay on track.   7. Class 1 obesity with serious comorbidity and body mass index (BMI) of 30.0 to 30.9 in  adult, unspecified obesity type  Course: Maurice Park is currently in the action stage of change. As such, his goal is to continue with weight loss efforts.   Nutrition goals: He has agreed to the Category 2 Plan.   Exercise goals: Less exercise due to costochondritis.  Behavioral modification strategies: increasing lean protein intake, decreasing simple carbohydrates, increasing vegetables and increasing water intake.  Maurice Park has agreed to follow-up with our clinic in 2 weeks. He was informed of the importance of frequent follow-up visits to maximize his success with intensive lifestyle modifications for his multiple health conditions.   Objective:   Blood pressure (!) 147/76, pulse (!) 56, temperature 97.9 F (36.6 C), temperature source Oral, height _0  (1.626 m), weight 178 lb (80.7 kg), SpO2 95 %. Body mass index is 30.55 kg/m.  General: Cooperative, alert, well developed, in no acute distress. HEENT: Conjunctivae and lids unremarkable. Cardiovascular: Regular rhythm.  Lungs: Normal work of breathing. Neurologic: No focal deficits.   Lab Results  Component Value Date   CREATININE 1.1 08/03/2020   BUN 12 08/03/2020   NA 143 07/02/2018   K 4.1 07/02/2018   CL 103 07/02/2018   CO2 27 07/02/2018   Lab Results  Component Value Date   ALT 39 07/15/2019   AST 26 07/15/2019   ALKPHOS 66 07/15/2019   BILITOT 0.7 07/15/2019   Lab Results  Component Value Date   HGBA1C 5.9 08/03/2020   HGBA1C 5.9 (H) 10/02/2011   Lab Results  Component Value Date   TSH 1.263 10/02/2011   Lab Results  Component Value Date   CHOL 133 08/03/2020   HDL 48 08/03/2020   LDLCALC 65 08/03/2020   TRIG 103 08/03/2020   CHOLHDL 2.7 07/15/2019   Lab Results  Component Value Date   WBC 5.4 07/14/2013   HGB 15.7 07/14/2013   HCT 45.9 07/14/2013   MCV 94.8 07/14/2013   PLT 215 07/14/2013   Attestation Statements:   Reviewed by clinician on day of visit: allergies, medications, problem list,  medical history, surgical history, family history, social history, and previous encounter notes.  I, Water quality scientist, CMA, am acting as transcriptionist for Briscoe Deutscher, DO  I have reviewed the above documentation for accuracy and completeness, and I agree with the above. Briscoe Deutscher, DO

## 2020-11-09 ENCOUNTER — Encounter (INDEPENDENT_AMBULATORY_CARE_PROVIDER_SITE_OTHER): Payer: Self-pay

## 2020-11-11 ENCOUNTER — Other Ambulatory Visit: Payer: Self-pay

## 2020-11-11 ENCOUNTER — Encounter (INDEPENDENT_AMBULATORY_CARE_PROVIDER_SITE_OTHER): Payer: Self-pay | Admitting: Adult Health

## 2020-11-11 ENCOUNTER — Ambulatory Visit (INDEPENDENT_AMBULATORY_CARE_PROVIDER_SITE_OTHER): Payer: 59 | Admitting: Adult Health

## 2020-11-11 VITALS — BP 133/74 | HR 60 | Temp 97.5°F | Ht 64.0 in | Wt 176.0 lb

## 2020-11-11 DIAGNOSIS — M940 Chondrocostal junction syndrome [Tietze]: Secondary | ICD-10-CM

## 2020-11-11 DIAGNOSIS — E669 Obesity, unspecified: Secondary | ICD-10-CM

## 2020-11-11 DIAGNOSIS — E8881 Metabolic syndrome: Secondary | ICD-10-CM | POA: Insufficient documentation

## 2020-11-11 DIAGNOSIS — R7303 Prediabetes: Secondary | ICD-10-CM

## 2020-11-11 DIAGNOSIS — Z6830 Body mass index (BMI) 30.0-30.9, adult: Secondary | ICD-10-CM

## 2020-11-15 DIAGNOSIS — M940 Chondrocostal junction syndrome [Tietze]: Secondary | ICD-10-CM | POA: Insufficient documentation

## 2020-11-15 NOTE — Progress Notes (Signed)
Chief Complaint:   OBESITY Maurice Park is here to discuss his progress with his obesity treatment plan along with follow-up of his obesity related diagnoses. Maurice Park is on the Category 2 Plan and states he is following his eating plan approximately 65% of the time. Maurice Park states he is walking and playing tennis 60 minutes 3 times per week.  Today's visit was #: 5 Starting weight: 186 lbs Starting date: 09/16/2020 Today's weight: 176 lbs Today's date: 11/11/2020 Total lbs lost to date: 10 lbs Total lbs lost since last in-office visit: 2 lbs  Interim History: Maurice Park insurance has denied Ozempic for metabolic syndrome and Trulicity for pre-diabetes. 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 He has since gotten back on track with Category 2 meal plan.  Subjective:   1. Pre-diabetes 08/03/2020 A1c was 5.9. We reviewed labs from [facility] and A1c has been slowly increasing since early 2020.  2. Metabolic syndrome Ozempic was ordered and coverage was denied by [employer].  3. Costochondritis Maurice Park reports 100% resolution of symptoms with rest.  Assessment/Plan:   1. Pre-diabetes Maurice Park will continue to work on weight loss, exercise, and decreasing simple carbohydrates to help decrease the risk of diabetes. Continue Cat 2 meal plan and regular exercise.  2. Metabolic syndrome Increase protein and continue regular exercise.  3. Costochondritis Ensure proper warm up and stretching prior to exercise and/or physical exertion.   4. Class 1 obesity with serious comorbidity and body mass index (BMI) of 30.0 to 30.9 in adult, unspecified obesity type Maurice Park is currently in the action stage of change. As such, his goal is to continue with weight loss efforts. He has agreed to the Category 2 Plan.   Exercise goals: As is  Behavioral modification strategies: increasing lean protein intake, decreasing simple carbohydrates, meal planning and  cooking strategies and planning for success.  Maurice Park has agreed to follow-up with our clinic in 2 weeks. He was informed of the importance of frequent follow-up visits to maximize his success with intensive lifestyle modifications for his multiple health conditions.   Objective:   Blood pressure 133/74, pulse 60, temperature (!) 97.5 F (36.4 C), height 5\' 4"  (1.626 m), weight 176 lb (79.8 kg), SpO2 96 %. Body mass index is 30.21 kg/m.  General: Cooperative, alert, well developed, in no acute distress. HEENT: Conjunctivae and lids unremarkable. Cardiovascular: Regular rhythm.  Lungs: Normal work of breathing. Neurologic: No focal deficits.   Lab Results  Component Value Date   CREATININE 1.1 08/03/2020   BUN 12 08/03/2020   NA 143 07/02/2018   K 4.1 07/02/2018   CL 103 07/02/2018   CO2 27 07/02/2018   Lab Results  Component Value Date   ALT 39 07/15/2019   AST 26 07/15/2019   ALKPHOS 66 07/15/2019   BILITOT 0.7 07/15/2019   Lab Results  Component Value Date   HGBA1C 5.9 08/03/2020   HGBA1C 5.9 (H) 10/02/2011   No results found for: INSULIN Lab Results  Component Value Date   TSH 1.263 10/02/2011   Lab Results  Component Value Date   CHOL 133 08/03/2020   HDL 48 08/03/2020   LDLCALC 65 08/03/2020   TRIG 103 08/03/2020   CHOLHDL 2.7 07/15/2019   Lab Results  Component Value Date   WBC 5.4 07/14/2013   HGB 15.7 07/14/2013   HCT 45.9 07/14/2013   MCV 94.8 07/14/2013   PLT 215 07/14/2013    Attestation  Statements:   Reviewed by clinician on day of visit: allergies, medications, problem list, medical history, surgical history, family history, social history, and previous encounter notes.  Time spent on visit including pre-visit chart review and post-visit care and charting was 32 minutes.   [name], am acting as Energy manager for William Hamburger, NP.  I have reviewed the above documentation for accuracy and completeness, and I agree with the  above. - Nerida Boivin d. Achilles Neville, NP-C

## 2020-11-17 ENCOUNTER — Other Ambulatory Visit: Payer: Self-pay | Admitting: Cardiovascular Disease

## 2020-12-02 ENCOUNTER — Ambulatory Visit (INDEPENDENT_AMBULATORY_CARE_PROVIDER_SITE_OTHER): Payer: 59 | Admitting: Family Medicine

## 2020-12-02 ENCOUNTER — Other Ambulatory Visit: Payer: Self-pay

## 2020-12-02 ENCOUNTER — Encounter (INDEPENDENT_AMBULATORY_CARE_PROVIDER_SITE_OTHER): Payer: Self-pay | Admitting: Family Medicine

## 2020-12-02 VITALS — BP 121/78 | HR 61 | Temp 97.9°F | Ht 64.0 in | Wt 175.0 lb

## 2020-12-02 DIAGNOSIS — E669 Obesity, unspecified: Secondary | ICD-10-CM | POA: Diagnosis not present

## 2020-12-02 DIAGNOSIS — R7303 Prediabetes: Secondary | ICD-10-CM

## 2020-12-02 DIAGNOSIS — I10 Essential (primary) hypertension: Secondary | ICD-10-CM

## 2020-12-02 DIAGNOSIS — Z9189 Other specified personal risk factors, not elsewhere classified: Secondary | ICD-10-CM | POA: Diagnosis not present

## 2020-12-02 DIAGNOSIS — E8881 Metabolic syndrome: Secondary | ICD-10-CM | POA: Diagnosis not present

## 2020-12-02 DIAGNOSIS — Z6832 Body mass index (BMI) 32.0-32.9, adult: Secondary | ICD-10-CM

## 2020-12-13 NOTE — Progress Notes (Signed)
Chief Complaint:   OBESITY Maurice Park is here to discuss his progress with his obesity treatment plan along with follow-up of his obesity related diagnoses.   Today's visit was #: 6 Starting weight: 186 lbs Starting date: 09/16/2020 Today's weight: 175 lbs Today's date: 12/02/2020 Total lbs lost to date: 11 lbs Body mass index is 30.04 kg/m.  Total weight loss percentage to date: -5.91%  Interim History:  Maurice Park says he is getting back to walking.  He enjoyed his ski trip.  He overindulged a little, he reports.   Current Meal Plan: the Category 2 Plan for 50-60% of the time.  Current Exercise Plan: Skiing for 6 hours 4 days per week.  Assessment/Plan:   1. Essential hypertension At goal. Medications: Apresoline 25 mg daily, Altace 5 mg daily, metoprolol 50 mg daily.   Plan: Avoid buying foods that are: processed, frozen, or prepackaged to avoid excess salt. We will watch for signs of hypotension as he continues lifestyle modifications. We will continue to monitor closely alongside his PCP and/or Specialist.  Regular follow up with PCP and specialists was also encouraged.   BP Readings from Last 3 Encounters:  12/02/20 121/78  11/11/20 133/74  10/28/20 (!) 147/76   Lab Results  Component Value Date   CREATININE 1.1 08/03/2020   2. Prediabetes Not at goal. Goal is HgbA1c < 5.7.  Medication: None.    Plan:  He will continue to focus on protein-rich, low simple carbohydrate foods. We reviewed the importance of hydration, regular exercise for stress reduction, and restorative sleep.   Lab Results  Component Value Date   HGBA1C 5.9 08/03/2020   3. Metabolic syndrome Starting goal: Lose 7-10% of starting weight. He will continue to focus on protein-rich, low simple carbohydrate foods. We reviewed the importance of hydration, regular exercise for stress reduction, and restorative sleep.  We will continue to check lab work every 3 months, with 10% weight loss, or should any other  concerns arise.  4. At risk for heart disease Due to Maurice Park's current state of health and medical condition(s), he is at a higher risk for heart disease.  This puts the patient at much greater risk to subsequently develop cardiopulmonary conditions that can significantly affect patient's quality of life in a negative manner.    At least 9 minutes were spent on counseling Maurice Park about these concerns today. Evidence-based interventions for health behavior change were utilized today including the discussion of self monitoring techniques, problem-solving barriers, and SMART goal setting techniques.  Specifically, regarding patient's less desirable eating habits and patterns, we employed the technique of small changes when Maurice Park has not been able to fully commit to his prudent nutritional plan.  5. Obesity, current BMI 33.2  Course: Maurice Park is currently in the action stage of change. As such, his goal is to continue with weight loss efforts.   Nutrition goals: He has agreed to the Category 2 Plan.   Exercise goals: For substantial health benefits, adults should do at least 150 minutes (2 hours and 30 minutes) a week of moderate-intensity, or 75 minutes (1 hour and 15 minutes) a week of vigorous-intensity aerobic physical activity, or an equivalent combination of moderate- and vigorous-intensity aerobic activity. Aerobic activity should be performed in episodes of at least 10 minutes, and preferably, it should be spread throughout the week.  Behavioral modification strategies: increasing lean protein intake, decreasing simple carbohydrates, increasing vegetables and increasing water intake.  Maurice Park has agreed to follow-up with our clinic in  4 weeks. He was informed of the importance of frequent follow-up visits to maximize his success with intensive lifestyle modifications for his multiple health conditions.   Objective:   Blood pressure 121/78, pulse 61, temperature 97.9 F (36.6 C), temperature source  Oral, height 5\' 4"  (1.626 m), weight 175 lb (79.4 kg), SpO2 95 %. Body mass index is 30.04 kg/m.  General: Cooperative, alert, well developed, in no acute distress. HEENT: Conjunctivae and lids unremarkable. Cardiovascular: Regular rhythm.  Lungs: Normal work of breathing. Neurologic: No focal deficits.   Lab Results  Component Value Date   CREATININE 1.1 08/03/2020   BUN 12 08/03/2020   NA 143 07/02/2018   K 4.1 07/02/2018   CL 103 07/02/2018   CO2 27 07/02/2018   Lab Results  Component Value Date   ALT 39 07/15/2019   AST 26 07/15/2019   ALKPHOS 66 07/15/2019   BILITOT 0.7 07/15/2019   Lab Results  Component Value Date   HGBA1C 5.9 08/03/2020   HGBA1C 5.9 (H) 10/02/2011   Lab Results  Component Value Date   TSH 1.263 10/02/2011   Lab Results  Component Value Date   CHOL 133 08/03/2020   HDL 48 08/03/2020   LDLCALC 65 08/03/2020   TRIG 103 08/03/2020   CHOLHDL 2.7 07/15/2019   Lab Results  Component Value Date   WBC 5.4 07/14/2013   HGB 15.7 07/14/2013   HCT 45.9 07/14/2013   MCV 94.8 07/14/2013   PLT 215 07/14/2013   Attestation Statements:   Reviewed by clinician on day of visit: allergies, medications, problem list, medical history, surgical history, family history, social history, and previous encounter notes.  I, 13/11/2012, CMA, am acting as transcriptionist for Insurance claims handler, DO  I have reviewed the above documentation for accuracy and completeness, and I agree with the above. Helane Rima, DO

## 2020-12-16 ENCOUNTER — Ambulatory Visit (INDEPENDENT_AMBULATORY_CARE_PROVIDER_SITE_OTHER): Payer: 59 | Admitting: Adult Health

## 2020-12-16 ENCOUNTER — Other Ambulatory Visit: Payer: Self-pay

## 2020-12-16 VITALS — BP 138/78 | HR 77 | Temp 97.6°F | Ht 64.0 in | Wt 175.0 lb

## 2020-12-16 DIAGNOSIS — Z6830 Body mass index (BMI) 30.0-30.9, adult: Secondary | ICD-10-CM

## 2020-12-16 DIAGNOSIS — R7303 Prediabetes: Secondary | ICD-10-CM

## 2020-12-16 DIAGNOSIS — I10 Essential (primary) hypertension: Secondary | ICD-10-CM | POA: Diagnosis not present

## 2020-12-16 DIAGNOSIS — E7849 Other hyperlipidemia: Secondary | ICD-10-CM | POA: Diagnosis not present

## 2020-12-16 DIAGNOSIS — E669 Obesity, unspecified: Secondary | ICD-10-CM | POA: Diagnosis not present

## 2020-12-16 DIAGNOSIS — Z9189 Other specified personal risk factors, not elsewhere classified: Secondary | ICD-10-CM | POA: Diagnosis not present

## 2020-12-17 LAB — LIPID PANEL
Chol/HDL Ratio: 3 ratio (ref 0.0–5.0)
Cholesterol, Total: 136 mg/dL (ref 100–199)
HDL: 45 mg/dL (ref >39)
LDL Chol Calc (NIH): 74 mg/dL (ref 0–99)
Triglycerides: 91 mg/dL (ref 0–149)
VLDL Cholesterol Cal: 17 mg/dL (ref 5–40)

## 2020-12-17 LAB — COMPREHENSIVE METABOLIC PANEL
ALT: 44 IU/L (ref 0–44)
AST: 34 IU/L (ref 0–40)
Albumin/Globulin Ratio: 2.1 (ref 1.2–2.2)
Albumin: 4.5 g/dL (ref 3.8–4.8)
Alkaline Phosphatase: 70 IU/L (ref 44–121)
BUN/Creatinine Ratio: 11 (ref 10–24)
BUN: 12 mg/dL (ref 8–27)
Bilirubin Total: 0.4 mg/dL (ref 0.0–1.2)
CO2: 23 mmol/L (ref 20–29)
Calcium: 9.2 mg/dL (ref 8.6–10.2)
Chloride: 102 mmol/L (ref 96–106)
Creatinine, Ser: 1.14 mg/dL (ref 0.76–1.27)
Globulin, Total: 2.1 g/dL (ref 1.5–4.5)
Glucose: 85 mg/dL (ref 65–99)
Potassium: 4.3 mmol/L (ref 3.5–5.2)
Sodium: 141 mmol/L (ref 134–144)
Total Protein: 6.6 g/dL (ref 6.0–8.5)
eGFR: 72 mL/min/{1.73_m2} (ref >59)

## 2020-12-17 LAB — HEMOGLOBIN A1C
Est. average glucose Bld gHb Est-mCnc: 117 mg/dL
Hgb A1c MFr Bld: 5.7 % — ABNORMAL HIGH (ref 4.8–5.6)

## 2020-12-17 LAB — INSULIN, RANDOM: INSULIN: 8.4 u[IU]/mL (ref 2.6–24.9)

## 2020-12-20 NOTE — Progress Notes (Signed)
Chief Complaint:   OBESITY Maurice Park is here to discuss his progress with his obesity treatment plan along with follow-up of his obesity related diagnoses. Maurice Park is on the Category 2 Plan and states he is following his eating plan approximately 60% of the time. Maurice Park states he is walking 30-45 minutes 2 times per week.  Today's visit was #: 7 Starting weight: 186 lbs Starting date: 09/16/2020 Today's weight: 175 lbs Today's date: 12/16/2020 Total lbs lost to date: 11 Total lbs lost since last in-office visit: 0  Interim History: Maurice Park is experiencing fatigue, nasal congestion, low grade fever (highest temp 100.5 F oral). He has had 2 negative home COVID-19 antigen tests. He has relaxed his eating plan while acutely ill.   Subjective:   1. Essential hypertension Maurice Park's BP/HR are stable at OV. He denies cardiac symptoms.  BP Readings from Last 3 Encounters:  12/16/20 138/78  12/02/20 121/78  11/11/20 133/74   2. Other hyperlipidemia Maurice Park is on Vytorin 10-20 mg QD. His 08/03/2020 LDL 65, HDL 48, and triglycerides 195.  3. Pre-diabetes Maurice Park's 08/03/2020 A1c was 5.9. He is not on Metformin.  4. At risk for heart disease Maurice Park is at a higher than average risk for cardiovascular disease due to obesity, hypertension, and hyperlipidemia.  Assessment/Plan:   1. Essential hypertension Maurice Park is working on healthy weight loss and exercise to improve blood pressure control. We will watch for signs of hypotension as he continues his lifestyle modifications. Check labs today.  - Comprehensive metabolic panel  2. Other hyperlipidemia Cardiovascular risk and specific lipid/LDL goals reviewed.  We discussed several lifestyle modifications today and Maurice Park will continue to work on diet, exercise and weight loss efforts. Orders and follow up as documented in patient record. Check labs today.  Counseling Intensive lifestyle modifications are the first line treatment for this  issue. . Dietary changes: Increase soluble fiber. Decrease simple carbohydrates. . Exercise changes: Moderate to vigorous-intensity aerobic activity 150 minutes per week if tolerated. . Lipid-lowering medications: see documented in medical record.  - Lipid panel  3. Pre-diabetes Maurice Park will continue to work on weight loss, exercise, and decreasing simple carbohydrates to help decrease the risk of diabetes. Check labs today.  - Hemoglobin A1c - Insulin, random  4. At risk for heart disease Maurice Park was given approximately 15 minutes of coronary artery disease prevention counseling today. He is 65 y.o. male and has risk factors for heart disease including obesity. We discussed intensive lifestyle modifications today with an emphasis on specific weight loss instructions and strategies.   Repetitive spaced learning was employed today to elicit superior memory formation and behavioral change.  5. Class 1 obesity with serious comorbidity and body mass index (BMI) of 30.0 to 30.9 in adult, unspecified obesity type Maurice Park is currently in the action stage of change. As such, his goal is to continue with weight loss efforts. He has agreed to the Category 2 Plan.   Increase fluids, rest, and Vit C.  Exercise goals: As is  Behavioral modification strategies: increasing lean protein intake, increasing water intake, no skipping meals, meal planning and cooking strategies and planning for success.  Maurice Park has agreed to follow-up with our clinic in 4 weeks. He was informed of the importance of frequent follow-up visits to maximize his success with intensive lifestyle modifications for his multiple health conditions.   Maurice Park was informed we would discuss his lab results at his next visit unless there is a critical issue that needs to be addressed  sooner. Maurice Park agreed to keep his next visit at the agreed upon time to discuss these results.  Objective:   Blood pressure 138/78, pulse 77, temperature 97.6 F  (36.4 C), height 5\' 4"  (1.626 m), weight 175 lb (79.4 kg), SpO2 98 %. Body mass index is 30.04 kg/m.  General: Cooperative, alert, well developed, in no acute distress. HEENT: Conjunctivae and lids unremarkable. Cardiovascular: Regular rhythm.  Lungs: Normal work of breathing. Neurologic: No focal deficits.   Lab Results  Component Value Date   CREATININE 1.14 12/16/2020   BUN 12 12/16/2020   NA 141 12/16/2020   K 4.3 12/16/2020   CL 102 12/16/2020   CO2 23 12/16/2020   Lab Results  Component Value Date   ALT 44 12/16/2020   AST 34 12/16/2020   ALKPHOS 70 12/16/2020   BILITOT 0.4 12/16/2020   Lab Results  Component Value Date   HGBA1C 5.7 (H) 12/16/2020   HGBA1C 5.9 08/03/2020   HGBA1C 5.9 (H) 10/02/2011   Lab Results  Component Value Date   INSULIN 8.4 12/16/2020   Lab Results  Component Value Date   TSH 1.263 10/02/2011   Lab Results  Component Value Date   CHOL 136 12/16/2020   HDL 45 12/16/2020   LDLCALC 74 12/16/2020   TRIG 91 12/16/2020   CHOLHDL 3.0 12/16/2020   Lab Results  Component Value Date   WBC 5.4 07/14/2013   HGB 15.7 07/14/2013   HCT 45.9 07/14/2013   MCV 94.8 07/14/2013   PLT 215 07/14/2013    Attestation Statements:   Reviewed by clinician on day of visit: allergies, medications, problem list, medical history, surgical history, family history, social history, and previous encounter notes.  13/11/2012, am acting as [name] for Energy manager, NP.  I have reviewed the above documentation for accuracy and completeness, and I agree with the above. -  Jocilyn Trego d. Niamya Vittitow, NP-C

## 2020-12-24 ENCOUNTER — Other Ambulatory Visit: Payer: Self-pay | Admitting: Physician Assistant

## 2021-01-11 ENCOUNTER — Other Ambulatory Visit: Payer: Self-pay

## 2021-01-11 MED ORDER — RANOLAZINE ER 500 MG PO TB12: 500.0000 mg | ORAL_TABLET | Freq: Two times a day (BID) | ORAL | 0 refills | Status: DC

## 2021-01-12 ENCOUNTER — Ambulatory Visit (INDEPENDENT_AMBULATORY_CARE_PROVIDER_SITE_OTHER): Payer: 59 | Admitting: Family Medicine

## 2021-01-26 ENCOUNTER — Encounter: Payer: Self-pay | Admitting: Cardiovascular Disease

## 2021-01-26 ENCOUNTER — Encounter (INDEPENDENT_AMBULATORY_CARE_PROVIDER_SITE_OTHER): Payer: Self-pay | Admitting: Adult Health

## 2021-01-26 ENCOUNTER — Other Ambulatory Visit: Payer: Self-pay

## 2021-01-26 ENCOUNTER — Ambulatory Visit: Payer: 59 | Admitting: Cardiovascular Disease

## 2021-01-26 ENCOUNTER — Ambulatory Visit (INDEPENDENT_AMBULATORY_CARE_PROVIDER_SITE_OTHER): Payer: 59 | Admitting: Adult Health

## 2021-01-26 VITALS — BP 143/75 | HR 67 | Temp 97.9°F | Ht 64.0 in | Wt 174.0 lb

## 2021-01-26 VITALS — BP 130/72 | HR 60 | Ht 64.0 in | Wt 179.8 lb

## 2021-01-26 DIAGNOSIS — I6521 Occlusion and stenosis of right carotid artery: Secondary | ICD-10-CM

## 2021-01-26 DIAGNOSIS — I701 Atherosclerosis of renal artery: Secondary | ICD-10-CM | POA: Diagnosis not present

## 2021-01-26 DIAGNOSIS — R7303 Prediabetes: Secondary | ICD-10-CM

## 2021-01-26 DIAGNOSIS — E782 Mixed hyperlipidemia: Secondary | ICD-10-CM | POA: Diagnosis not present

## 2021-01-26 DIAGNOSIS — R202 Paresthesia of skin: Secondary | ICD-10-CM

## 2021-01-26 DIAGNOSIS — I2510 Atherosclerotic heart disease of native coronary artery without angina pectoris: Secondary | ICD-10-CM | POA: Diagnosis not present

## 2021-01-26 DIAGNOSIS — R200 Anesthesia of skin: Secondary | ICD-10-CM | POA: Diagnosis not present

## 2021-01-26 DIAGNOSIS — I739 Peripheral vascular disease, unspecified: Secondary | ICD-10-CM | POA: Diagnosis not present

## 2021-01-26 DIAGNOSIS — Z9189 Other specified personal risk factors, not elsewhere classified: Secondary | ICD-10-CM | POA: Diagnosis not present

## 2021-01-26 DIAGNOSIS — I10 Essential (primary) hypertension: Secondary | ICD-10-CM

## 2021-01-26 DIAGNOSIS — E7849 Other hyperlipidemia: Secondary | ICD-10-CM

## 2021-01-26 DIAGNOSIS — Z6830 Body mass index (BMI) 30.0-30.9, adult: Secondary | ICD-10-CM

## 2021-01-26 DIAGNOSIS — E669 Obesity, unspecified: Secondary | ICD-10-CM

## 2021-01-26 NOTE — Assessment & Plan Note (Signed)
History of CAD status post posterior lateral branch stenting by myself 02/21/2000.  He was recatheterized in 2011 revealing 60% "in-stent restenosis within his PLA stent which I redilated.  Ultimately, he had catheterization performed by Dr. Tresa Endo 12/24/2009 revealing left main/three-vessel disease and he underwent CABG x4 with a LIMA to the LAD, left radial to the diagonal branch, sequential vein to the PDA and PLA system.  He was recatheterized 10/02/2010 revealing an occluded left radial graft to diagonal branch, patent LIMA to LAD and patent sequential vein to the PDA and PLA with normal LV function.  He is completely asymptomatic.  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

## 2021-01-26 NOTE — Addendum Note (Signed)
Addended by: Bernita Buffy on: 01/26/2021 10:05 AM   Modules accepted: Orders

## 2021-01-26 NOTE — Assessment & Plan Note (Signed)
History of renal artery stenosis status post right renal artery stenting by myself in the past with renal Dopplers performed 07/13/2020 revealed a widely patent right renal artery stent.  This will be repeated this coming November.

## 2021-01-26 NOTE — Addendum Note (Signed)
Addended by: Bernita Buffy on: 01/26/2021 09:55 AM   Modules accepted: Orders

## 2021-01-26 NOTE — Patient Instructions (Signed)
Medication Instructions:  Your physician recommends that you continue on your current medications as directed. Please refer to the Current Medication list given to you today.  *If you need a refill on your cardiac medications before your next appointment, please call your pharmacy*   Testing/Procedures: Your physician has requested that you have a lower extremity arterial duplex. This test is an ultrasound of the arteries in the legs. It looks at arterial blood flow in the legs. Allow one hour for Lower Arterial scans. There are no restrictions or special instructions  Your physician has requested that you have an ankle brachial index (ABI). During this test an ultrasound and blood pressure cuff are used to evaluate the arteries that supply the arms and legs with blood. Allow thirty minutes for this exam. There are no restrictions or special instructions.  These procedures are done at [address] [facility]nd Floor  Your physician has requested that you have a carotid duplex. This test is an ultrasound of the carotid arteries in your neck. It looks at blood flow through these arteries that supply the brain with blood. Allow one hour for this exam. There are no restrictions or special instructions.  Your physician has requested that you have a renal artery duplex. During this test, an ultrasound is used to evaluate blood flow to the kidneys. Allow one hour for this exam. Do not eat after midnight the day before and avoid carbonated beverages. Take your medications as you usually do.  These procedures are done at [address] [facility] 2nd Floor. To be done in November 2022.   Follow-Up: At [facility], you and your health needs are our priority.  As part of our continuing mission to provide you with exceptional heart care, we have created designated Provider Care Teams.  These Care Teams include your primary Cardiologist (physician) and Advanced Practice Providers (APPs -  Physician Assistants and  Nurse Practitioners) who all work together to provide you with the care you need, when you need it.  We recommend signing up for the patient portal called "MyChart".  Sign up information is provided on this After Visit Summary.  MyChart is used to connect with patients for Virtual Visits (Telemedicine).  Patients are able to view lab/test results, encounter notes, upcoming appointments, etc.  Non-urgent messages can be sent to your provider as well.   To learn more about what you can do with MyChart, go to [email or web address].    Your next appointment:   12 month(s)  The format for your next appointment:   In Person  Provider:   Nanetta Batty, MD

## 2021-01-26 NOTE — Assessment & Plan Note (Signed)
History of hyperlipidemia on Vytorin with lipid profile performed 12/16/2020 revealing a total cholesterol 136, LDL 74 and HDL 45.

## 2021-01-26 NOTE — Assessment & Plan Note (Signed)
History of essential hypertension blood pressure measured today 130/72.  He is on metoprolol, hydralazine and ramipril.

## 2021-01-26 NOTE — Progress Notes (Signed)
01/26/2021 [name]   1956-06-11  [id number]  Primary Physician Lupita Raider, MD Primary Cardiologist: Runell Gess MD Milagros Loll, [place], [place]  HPI:  Maurice Park is a 65 y.o.  widowed (wife died from ALS 03/02/2012) [place] male, father of 1 son [name]) who I last sawin the office 07/25/2019.   His son is about to graduated from [employer] moved to [place] where he plans to study prosthetics.  08/06/2018.  He remarried to [name], who works as a Public librarian at a Retail buyer.  He has a history of CAD and PVOD. I stented his posterolateral branch back February 21, 2000. He had moderate LAD and circumflex disease at that time. He was recatheterization in 2011 revealing 60% "in-stent restenosis" of his PLA stent which I redilated. He also had right renal artery stenosis which I stented as well. Dr. Tawanna Cooler Early performed elective right carotid endarterectomy on him in 2007 which we follow by duplex ultrasound. He developed crescendo angina and was catheterized by Dr. Daphene Jaeger December 24, 2009, revealing left main 3-vessel disease. He underwent coronary artery bypass grafting x4 December 20, 2009, with a LIMA to his LAD, a left radial to a diagonal branch, sequential vein to the PDA and PLA system. His postop course was complicated by prolonged paroxysmal atrial fibrillation which he recuperated from nicely. <BR><BR>He had a Myoview stress test performed March 22, 2010, which was nonischemic. Renal Dopplers continue to show widely patent right renal artery stent and carotid Dopplers show patent endarterectomy site. He denies chest pain or shortness of breath. He was catheterized October 02, 2010, revealing an occluded left radial graft to a diagonal branch, patent LIMA to the LAD and a patent sequential vein to the PDA and PLA with normal LV function and patent right renal artery stent. His last lipid profile was a year ago. Since I saw him 18 months ago he's remained completely stable. He  is working out with a Psychologist, educational 2 days a week. His son [name] is now56 years old and has gotten into [employer]his wife of 25 years, [name], died 06-12-13of ALS.He remarried to his current wife [name] on 01/29/16.  Since I saw him in the office a year and a half ago he continues to do well.   He is very active and plays tennis for long periods of time without symptoms.  His lipid profile and hemoglobin A1c are in the therapeutic range.  Recent carotid and renal Dopplers were normal.  He does complain of some right calf claudication however which is new over the last 6 to 8 months.   Current Meds  Medication Sig  . aspirin EC 325 MG tablet Take 325 mg by mouth at bedtime.  Marland Kitchen ezetimibe-simvastatin (VYTORIN) 10-20 MG tablet TAKE 1 TABLET BY MOUTH EVERY DAY  . hydrALAZINE (APRESOLINE) 25 MG tablet TAKE 1 TABLET BY MOUTH TWICE A DAY  . metoprolol succinate (TOPROL-XL) 50 MG 24 hr tablet TAKE 1 TABLET DAILY WITH OR IMMEDIATELY FOLLOWING A MEAL  . ramipril (ALTACE) 5 MG capsule TAKE 1 CAPSULE BY MOUTH EVERY DAY  . ranolazine (RANEXA) 500 MG 12 hr tablet Take 1 tablet (500 mg total) by mouth 2 (two) times daily.     Allergies  Allergen Reactions  . Hydrocortisone Hives  . Codeine Nausea Only  . Contrast Media [Iodinated Diagnostic Agents] Hives  . Neosporin [Neomycin-Polymyxin-Gramicidin] Rash    Social History   Socioeconomic History  .  Marital status: Married    Spouse name: Not on file  . Number of children: 1  . Years of education: BSBA  . Highest education level: Not on file  Occupational History  . Occupation: Sr Nutritional therapist  Tobacco Use  . Smoking status: Former Smoker    Packs/day: 1.00    Years: 12.00    Pack years: 12.00    Types: Cigarettes    Quit date: 11/09/1981    Years since quitting: 39.2  . Smokeless tobacco: Never Used  Substance and Sexual Activity  . Alcohol use: Yes    Alcohol/week: 3.0 standard drinks    Types: 3 Cans of beer per week     Comment: last drink was in november  . Drug use: No  . Sexual activity: Not Currently    Comment: Right Carotid endarectomy  Other Topics Concern  . Not on file  Social History Narrative  . Not on file   Social Determinants of Health   Financial Resource Strain: Not on file  Food Insecurity: Not on file  Transportation Needs: Not on file  Physical Activity: Not on file  Stress: Not on file  Social Connections: Not on file  Intimate Partner Violence: Not on file     Review of Systems: General: negative for chills, fever, night sweats or weight changes.  Cardiovascular: negative for chest pain, dyspnea on exertion, edema, orthopnea, palpitations, paroxysmal nocturnal dyspnea or shortness of breath Dermatological: negative for rash Respiratory: negative for cough or wheezing Urologic: negative for hematuria Abdominal: negative for nausea, vomiting, diarrhea, bright red blood per rectum, melena, or hematemesis Neurologic: negative for visual changes, syncope, or dizziness All other systems reviewed and are otherwise negative except as noted above.    Blood pressure 130/72, pulse 60, height 5\' 4"  (1.626 m), weight 179 lb 12.8 oz (81.6 kg), SpO2 98 %.  General appearance: alert and no distress Neck: no adenopathy, no carotid bruit, no JVD, supple, symmetrical, trachea midline and thyroid not enlarged, symmetric, no tenderness/mass/nodules Lungs: clear to auscultation bilaterally Heart: regular rate and rhythm, S1, S2 normal, no murmur, click, rub or gallop Extremities: extremities normal, atraumatic, no cyanosis or edema Pulses: 2+ and symmetric Skin: Skin color, texture, turgor normal. No rashes or lesions Neurologic: Alert and oriented X 3, normal strength and tone. Normal symmetric reflexes. Normal coordination and gait  EKG not performed today  ASSESSMENT AND PLAN:   CAD (coronary artery disease), with CABG [place] to LAD, Lt. radial artery to 1st diagnal, sequential SVG  to PDA and PLA, 12/20/09 History of CAD status post posterior lateral branch stenting by myself 02/21/2000.  He was recatheterized in 2011 revealing 60% "in-stent restenosis within his PLA stent which I redilated.  Ultimately, he had catheterization performed by Dr. 2012 12/24/2009 revealing left main/three-vessel disease and he underwent CABG x4 with a LIMA to the LAD, left radial to the diagonal branch, sequential vein to the PDA and PLA system.  He was recatheterized 10/02/2010 revealing an occluded left radial graft to diagonal branch, patent LIMA to LAD and patent sequential vein to the PDA and PLA with normal LV function.  He is completely asymptomatic.  He is an active 10/04/2010 in the [employer] league and played 2 hours of tennis last night fairly vigorously without symptoms.  Hyperlipidemia, controlled History of hyperlipidemia on Vytorin with lipid profile performed 12/16/2020 revealing a total cholesterol 136, LDL 74 and HDL 45.  Renal artery stenosis History of renal artery stenosis status post right renal  artery stenting by myself in the past with renal Dopplers performed 07/13/2020 revealed a widely patent right renal artery stent.  This will be repeated this coming November.  Carotid artery disease History of carotid artery disease status post elective right carotid endarterectomy performed by Dr. Arbie Cookey in 2007 with duplex ultrasound performed 07/13/2020 revealing a widely patent endarterectomy site.  This will be repeated this coming November  Essential hypertension History of essential hypertension blood pressure measured today 130/72.  He is on metoprolol, hydralazine and ramipril.      Runell Gess MD FACP,FACC,FAHA, [facility] 01/26/2021 9:47 AM

## 2021-01-26 NOTE — Assessment & Plan Note (Signed)
History of carotid artery disease status post elective right carotid endarterectomy performed by Dr. Arbie Cookey in 2007 with duplex ultrasound performed 07/13/2020 revealing a widely patent endarterectomy site.  This will be repeated this coming November

## 2021-01-27 ENCOUNTER — Encounter (INDEPENDENT_AMBULATORY_CARE_PROVIDER_SITE_OTHER): Payer: Self-pay | Admitting: Adult Health

## 2021-01-27 DIAGNOSIS — R200 Anesthesia of skin: Secondary | ICD-10-CM | POA: Insufficient documentation

## 2021-01-27 LAB — VITAMIN B12: Vitamin B-12: 500 pg/mL (ref 232–1245)

## 2021-01-27 NOTE — Progress Notes (Signed)
Chief Complaint:   OBESITY Maurice Park is here to discuss his progress with his obesity treatment plan along with follow-up of his obesity related diagnoses. Maurice Park is on the Category 2 Plan and states he is following his eating plan approximately 60% of the time. Maurice Park states he is doing yard work and playing tennis 60-120 minutes 2 times per week.  Today's visit was #: 8 Starting weight: 186 lbs Starting date: 09/16/2020 Today's weight: 174 lbs Today's date: 01/26/2021 Total lbs lost to date: 12 lbs Total lbs lost since last in-office visit: 1  Interim History: Maurice Park played tennis for 2 hours last night. He is at a level 3.5- experiencing soreness today. He had to put his dog "Mini" to sleep- had some increased stress eating after putting "Mini" down.  He had this pet for 13 years.  Of Note: His mother celebrated her 93rd birthday.  Subjective:   1. Pre-diabetes Discussed labs with patient today. 12/16/2020 BG 85, A1c 5.7, and insulin level 8.4. Maurice Park's A1c is down from 5.9 on 08/03/2020.  Lab Results  Component Value Date   HGBA1C 5.7 (H) 12/16/2020   Lab Results  Component Value Date   INSULIN 8.4 12/16/2020   2. Essential hypertension Discussed labs with patient today. 12/16/2020 CMP- stable.  Maurice Park has a follow up with [name] at cardiology today at 0900.  Pt denies cardiac symptoms.  BP Readings from Last 3 Encounters:  01/26/21 130/72  01/26/21 (!) 143/75  12/16/20 138/78   3. Other hyperlipidemia Demian is on Vytorin 10/20 mg QD. 12/16/2020 lipid panel- stable.  Lab Results  Component Value Date   ALT 44 12/16/2020   AST 34 12/16/2020   ALKPHOS 70 12/16/2020   BILITOT 0.4 12/16/2020   Lab Results  Component Value Date   CHOL 136 12/16/2020   HDL 45 12/16/2020   LDLCALC 74 12/16/2020   TRIG 91 12/16/2020   CHOLHDL 3.0 12/16/2020    4. Numbness and tingling of right leg Kurtiss reports RLE tingling.   5. At risk for activity intolerance [name] is at risk  for exercise intolerance due to RLE numbness and tingling.  Assessment/Plan:   1. Pre-diabetes Izekiel will continue to work on weight loss, exercise, and decreasing simple carbohydrates to help decrease the risk of diabetes.   2. Essential hypertension Apolonio is working on healthy weight loss and exercise to improve blood pressure control. We will watch for signs of hypotension as he continues his lifestyle modifications. -Continue current anti-hypertensive regimen.  3. Other hyperlipidemia Cardiovascular risk and specific lipid/LDL goals reviewed.  We discussed several lifestyle modifications today and Maurice Park will continue to work on diet, exercise and weight loss efforts. Orders and follow up as documented in patient record.  -Continue current statin therapy. -Has f/u with Cardiology later today.  Counseling Intensive lifestyle modifications are the first line treatment for this issue. . Dietary changes: Increase soluble fiber. Decrease simple carbohydrates. . Exercise changes: Moderate to vigorous-intensity aerobic activity 150 minutes per week if tolerated. . Lipid-lowering medications: see documented in medical record.  4. Numbness and tingling of right leg Check labs today.  - Vitamin B12  5. At risk for activity intolerance Dontario was given approximately 15 minutes of exercise intolerance counseling today. He is 65 y.o. male and has risk factors exercise intolerance including obesity. We discussed intensive lifestyle modifications today with an emphasis on specific weight loss instructions and strategies. Maurice Park will slowly increase activity as tolerated.  Repetitive spaced learning was  employed today to elicit superior memory formation and behavioral change.  6. Class 1 obesity with serious comorbidity and body mass index (BMI) of 30.0 to 30.9 in adult, unspecified obesity type Maurice Park is currently in the action stage of change. As such, his goal is to continue with weight loss  efforts. He has agreed to the Category 2 Plan and keeping a food journal and adhering to recommended goals of 400-500 calories and 35 g protein with supper.   Exercise goals: As is  Behavioral modification strategies: increasing lean protein intake, decreasing simple carbohydrates, meal planning and cooking strategies and planning for success.  Maurice Park has agreed to follow-up with our clinic in 3 weeks. He was informed of the importance of frequent follow-up visits to maximize his success with intensive lifestyle modifications for his multiple health conditions.   Objective:   Blood pressure (!) 143/75, pulse 67, temperature 97.9 F (36.6 C), height 5\' 4"  (1.626 m), weight 174 lb (78.9 kg), SpO2 98 %. Body mass index is 29.87 kg/m.  General: Cooperative, alert, well developed, in no acute distress. HEENT: Conjunctivae and lids unremarkable. Cardiovascular: Regular rhythm.  Lungs: Normal work of breathing. Neurologic: No focal deficits.   Lab Results  Component Value Date   CREATININE 1.14 12/16/2020   BUN 12 12/16/2020   NA 141 12/16/2020   K 4.3 12/16/2020   CL 102 12/16/2020   CO2 23 12/16/2020   Lab Results  Component Value Date   ALT 44 12/16/2020   AST 34 12/16/2020   ALKPHOS 70 12/16/2020   BILITOT 0.4 12/16/2020   Lab Results  Component Value Date   HGBA1C 5.7 (H) 12/16/2020   HGBA1C 5.9 08/03/2020   HGBA1C 5.9 (H) 10/02/2011   Lab Results  Component Value Date   INSULIN 8.4 12/16/2020   Lab Results  Component Value Date   TSH 1.263 10/02/2011   Lab Results  Component Value Date   CHOL 136 12/16/2020   HDL 45 12/16/2020   LDLCALC 74 12/16/2020   TRIG 91 12/16/2020   CHOLHDL 3.0 12/16/2020   Lab Results  Component Value Date   WBC 5.4 07/14/2013   HGB 15.7 07/14/2013   HCT 45.9 07/14/2013   MCV 94.8 07/14/2013   PLT 215 07/14/2013   No results found for: IRON, TIBC, FERRITIN  Attestation Statements:   Reviewed by clinician on day of visit:  allergies, medications, problem list, medical history, surgical history, family history, social history, and previous encounter notes.  13/11/2012, CMA, am acting as transcriptionist for [name], NP.  I have reviewed the above documentation for accuracy and completeness, and I agree with the above. -  Harlan Ervine d. Mersadez Linden, NP-C

## 2021-02-16 ENCOUNTER — Ambulatory Visit (INDEPENDENT_AMBULATORY_CARE_PROVIDER_SITE_OTHER): Payer: 59 | Admitting: Adult Health

## 2021-02-16 ENCOUNTER — Other Ambulatory Visit: Payer: Self-pay

## 2021-02-16 VITALS — BP 163/76 | HR 55 | Temp 97.7°F | Ht 64.0 in | Wt 177.0 lb

## 2021-02-16 DIAGNOSIS — R7303 Prediabetes: Secondary | ICD-10-CM | POA: Diagnosis not present

## 2021-02-16 DIAGNOSIS — E669 Obesity, unspecified: Secondary | ICD-10-CM

## 2021-02-16 DIAGNOSIS — R200 Anesthesia of skin: Secondary | ICD-10-CM | POA: Diagnosis not present

## 2021-02-16 DIAGNOSIS — R202 Paresthesia of skin: Secondary | ICD-10-CM

## 2021-02-16 DIAGNOSIS — Z6830 Body mass index (BMI) 30.0-30.9, adult: Secondary | ICD-10-CM

## 2021-02-17 ENCOUNTER — Ambulatory Visit (HOSPITAL_COMMUNITY)
Admission: RE | Admit: 2021-02-17 | Discharge: 2021-02-17 | Disposition: A | Discharge status: Home / Self Care | Payer: 59 | Source: Ambulatory Visit | Attending: Cardiovascular Disease | Admitting: Cardiovascular Disease

## 2021-02-17 DIAGNOSIS — I739 Peripheral vascular disease, unspecified: Secondary | ICD-10-CM | POA: Diagnosis not present

## 2021-02-19 ENCOUNTER — Other Ambulatory Visit: Payer: Self-pay | Admitting: Cardiovascular Disease

## 2021-02-22 ENCOUNTER — Telehealth: Payer: Self-pay | Admitting: Cardiovascular Disease

## 2021-02-22 NOTE — Progress Notes (Signed)
Chief Complaint:   OBESITY Maurice Park is here to discuss his progress with his obesity treatment plan along with follow-up of his obesity related diagnoses. Maurice Park is on the Category 2 Plan and keeping a food journal and adhering to recommended goals of 400-500 calories and 35 grams protein with supper and states he is following his eating plan approximately 50% of the time. Maurice Park states he is playing tennis and walking ? minutes 3 times per week.  Today's visit was #: 9 Starting weight: 186 lbs Starting date: 09/16/2020 Today's weight: 177 lbs Today's date: 02/16/2021 Total lbs lost to date: 8 Total lbs lost since last in-office visit: 0  Interim History: Maurice Park's BP is elevated today due to acute headache. He ate off plan the last few weeks, especially with snacking. He is experiencing claudication of the RLE- will undergo [place] study with cardiology tomorrow.  Subjective:   1. Pre-diabetes 12/16/2020- A1c 5.7 with normal BG (85) and slightly elevated insulin level at 8.4. Ralston is not on any BG lowering medications.  Lab Results  Component Value Date   HGBA1C 5.7 (H) 12/16/2020   Lab Results  Component Value Date   INSULIN 8.4 12/16/2020   2. Numbness and tingling of right leg Began in Fall 2021- worsening. Maurice Park will have [place] study with cardiology tomorrow.  Assessment/Plan:   1. Pre-diabetes [name] will continue to work on weight loss, exercise, and decreasing simple carbohydrates to help decrease the risk of diabetes.  -Monitor labs every 3 months.  2. Numbness and tingling of right leg Follow up with cardiology as directed.  3. Class 1 obesity with serious comorbidity and body mass index (BMI) of 30.0 to 30.9 in adult, unspecified obesity type  Maurice Park is currently in the action stage of change. As such, his goal is to continue with weight loss efforts. He has agreed to the Category 2 Plan and keeping a food journal and adhering to recommended goals of 400-500 calories and 35  grams protein with supper.   Handout: High Protein/Low Calorie Sheet, Protein Content of Foods  Exercise goals:  As is  Behavioral modification strategies: increasing lean protein intake, decreasing simple carbohydrates, meal planning and cooking strategies, keeping healthy foods in the home, better snacking choices, and planning for success.  Maurice Park has agreed to follow-up with our clinic in 4 weeks. He was informed of the importance of frequent follow-up visits to maximize his success with intensive lifestyle modifications for his multiple health conditions.   Objective:   Blood pressure (!) 163/76, pulse (!) 55, temperature 97.7 F (36.5 C), height 5\' 4"  (1.626 m), weight 177 lb (80.3 kg), SpO2 99 %. Body mass index is 30.38 kg/m.  General: Cooperative, alert, well developed, in no acute distress. HEENT: Conjunctivae and lids unremarkable. Cardiovascular: Regular rhythm.  Lungs: Normal work of breathing. Neurologic: No focal deficits.   Lab Results  Component Value Date   CREATININE 1.14 12/16/2020   BUN 12 12/16/2020   NA 141 12/16/2020   K 4.3 12/16/2020   CL 102 12/16/2020   CO2 23 12/16/2020   Lab Results  Component Value Date   ALT 44 12/16/2020   AST 34 12/16/2020   ALKPHOS 70 12/16/2020   BILITOT 0.4 12/16/2020   Lab Results  Component Value Date   HGBA1C 5.7 (H) 12/16/2020   HGBA1C 5.9 08/03/2020   HGBA1C 5.9 (H) 10/02/2011   Lab Results  Component Value Date   INSULIN 8.4 12/16/2020   Lab Results  Component  Value Date   TSH 1.263 10/02/2011   Lab Results  Component Value Date   CHOL 136 12/16/2020   HDL 45 12/16/2020   LDLCALC 74 12/16/2020   TRIG 91 12/16/2020   CHOLHDL 3.0 12/16/2020   Lab Results  Component Value Date   WBC 5.4 07/14/2013   HGB 15.7 07/14/2013   HCT 45.9 07/14/2013   MCV 94.8 07/14/2013   PLT 215 07/14/2013   No results found for: IRON, TIBC, FERRITIN   Attestation Statements:   Reviewed by clinician on day of  visit: allergies, medications, problem list, medical history, surgical history, family history, social history, and previous encounter notes.  Maurice Park, CMA, am acting as transcriptionist for Maurice Hamburger, NP.  I have reviewed the above documentation for accuracy and completeness, and I agree with the above. - Maurice Park d. Maurice Shadowens, NP-C

## 2021-02-22 NOTE — Telephone Encounter (Signed)
This RN called patient back, patient asking for results of ABI/arterial duplex, Dr. Hazle Coca instructions in both result notes are to make an appointment to discuss results.   Runell Gess, MD  02/18/2021  4:53 PM EDT      Return office visit with me to discuss results at next available     Runell Gess, MD  02/18/2021  4:54 PM EDT      Decline in RABI. Return office visit with me to discuss results at nextavailable      This RN made appointment for pt with [name] July 1 at 2:30pm. This RN asked pt about symptoms, pt reports he has leg pain when he plays tennis or walks long distances but not for everyday ambulation. This RN advised patient to call 911 or be taken to the emergency room if he develops chest pain, shortness of breath, increasing pain in the leg, leg discoloration, or any other troubling symptoms. Pt verbalized understanding.

## 2021-02-22 NOTE — Telephone Encounter (Signed)
    Pt is calling to get vas result, he wanted to get an appt witn [name] sooner than August so they can discuss if he neede to get the surgery

## 2021-03-04 ENCOUNTER — Other Ambulatory Visit: Payer: Self-pay | Admitting: Cardiovascular Disease

## 2021-03-11 ENCOUNTER — Encounter: Payer: Self-pay | Admitting: Cardiovascular Disease

## 2021-03-11 ENCOUNTER — Ambulatory Visit: Payer: 59 | Admitting: Cardiovascular Disease

## 2021-03-11 ENCOUNTER — Other Ambulatory Visit: Payer: Self-pay

## 2021-03-11 DIAGNOSIS — I739 Peripheral vascular disease, unspecified: Secondary | ICD-10-CM | POA: Insufficient documentation

## 2021-03-11 MED ORDER — PREDNISONE 50 MG PO TABS: ORAL_TABLET | ORAL | 0 refills | Status: DC

## 2021-03-11 NOTE — H&P (View-Only) (Signed)
03/11/2021 Maurice Park   1956-04-15  [id number]  Primary Physician Lupita Raider, MD Primary Cardiologist: Runell Gess MD Milagros Loll, [place], [place]  HPI:  Maurice Park is a 65 y.o.  widowed (wife died from ALS Mar 10, 2012) [place] male, father of 1 son [name]) who I last saw   in the office 01/26/2021.Marland Kitchen   His son is about to graduated from [employer] moved to [place]. [facility] where he plans to study prosthetics.  Marland Kitchen  He remarried to [name], who works as a Public librarian at a Retail buyer.  He has a history of CAD and PVOD. I stented his posterolateral branch back February 21, 2000. He had moderate LAD and circumflex disease at that time. He was recatheterization in 2011 revealing 60% "in-stent restenosis" of his PLA stent which I redilated. He also had right renal artery stenosis which I stented as well. Dr. Tawanna Cooler Early performed elective right carotid endarterectomy on him in 2007 which we follow by duplex ultrasound. He developed crescendo angina and was catheterized by Dr. Daphene Jaeger December 24, 2009, revealing left main 3-vessel disease. He underwent coronary artery bypass grafting x4 December 20, 2009, with a LIMA to his LAD, a left radial to a diagonal branch, sequential vein to the PDA and PLA system. His postop course was complicated by prolonged paroxysmal atrial fibrillation which he recuperated from nicely. <BR><BR>He had a Myoview stress test performed March 22, 2010, which was nonischemic. Renal Dopplers continue to show widely patent right renal artery stent and carotid Dopplers show patent endarterectomy site. He denies chest pain or shortness of breath. He was catheterized October 02, 2010, revealing an occluded left radial graft to a diagonal branch, patent LIMA to the LAD and a patent sequential vein to the PDA and PLA with normal LV function and patent right renal artery stent. His last lipid profile was a year ago. Since I saw him 18 months ago he's remained completely stable. He is working  out with a Psychologist, educational 2 days a week. His son [name] is now 50 years old and has gotten into [employer]his wife of 25 years, [name], died 2013-06-27of ALS. He remarried to his current wife [name] on 01/29/16.       He is very active and plays tennis for long periods of time without symptoms.  His lipid profile and hemoglobin A1c are in the therapeutic range.  Recent carotid and renal Dopplers were normal.  He does complain of some right calf claudication however which is new over the last 6 to 8 months.  I obtain lower extremity arterial Dopplers on him 02/17/2021 revealed a right ABI of 0.92 and a left of 1.18.  He did have a high-frequency signal in his right iliac artery suggesting high-grade disease probably contributing to his lifestyle of any claudication.   Current Meds  Medication Sig   aspirin EC 325 MG tablet Take 325 mg by mouth at bedtime.   ezetimibe-simvastatin (VYTORIN) 10-20 MG tablet TAKE 1 TABLET BY MOUTH EVERY DAY   hydrALAZINE (APRESOLINE) 25 MG tablet TAKE 1 TABLET BY MOUTH TWICE A DAY   metoprolol succinate (TOPROL-XL) 50 MG 24 hr tablet TAKE 1 TABLET DAILY WITH OR IMMEDIATELY FOLLOWING A MEAL   Multiple Vitamin (MULTIVITAMIN) capsule Take 1 capsule by mouth daily.   ramipril (ALTACE) 5 MG capsule TAKE 1 CAPSULE BY MOUTH EVERY DAY   ranolazine (RANEXA) 500 MG 12 hr tablet TAKE 1 TABLET BY MOUTH  TWICE A DAY   [DISCONTINUED] OneTouch Delica Lancets 33G MISC Use to check Blood sugar 2 x daily DX: R73.03     Allergies  Allergen Reactions   Hydrocortisone Hives   Codeine Nausea Only   Contrast Media [Iodinated Diagnostic Agents] Hives   Neosporin [Neomycin-Polymyxin-Gramicidin] Rash    Social History   Socioeconomic History   Marital status: Married    Spouse name: Not on file   Number of children: 1   Years of education: BSBA   Highest education level: Not on file  Occupational History   Occupation: Sr Nutritional therapist  Tobacco Use   Smoking status:  Former    Packs/day: 1.00    Years: 12.00    Pack years: 12.00    Types: Cigarettes    Quit date: 11/09/1981    Years since quitting: 39.3   Smokeless tobacco: Never  Substance and Sexual Activity   Alcohol use: Yes    Alcohol/week: 3.0 standard drinks    Types: 3 Cans of beer per week    Comment: last drink was in november   Drug use: No   Sexual activity: Not Currently    Comment: Right Carotid endarectomy  Other Topics Concern   Not on file  Social History Narrative   Not on file   Social Determinants of Health   Financial Resource Strain: Not on file  Food Insecurity: Not on file  Transportation Needs: Not on file  Physical Activity: Not on file  Stress: Not on file  Social Connections: Not on file  Intimate Partner Violence: Not on file     Review of Systems: General: negative for chills, fever, night sweats or weight changes.  Cardiovascular: negative for chest pain, dyspnea on exertion, edema, orthopnea, palpitations, paroxysmal nocturnal dyspnea or shortness of breath Dermatological: negative for rash Respiratory: negative for cough or wheezing Urologic: negative for hematuria Abdominal: negative for nausea, vomiting, diarrhea, bright red blood per rectum, melena, or hematemesis Neurologic: negative for visual changes, syncope, or dizziness All other systems reviewed and are otherwise negative except as noted above.    Blood pressure 127/72, pulse 62, height 5\' 4"  (1.626 m), weight 182 lb 12.8 oz (82.9 kg), SpO2 94 %.  General appearance: alert and no distress Neck: no adenopathy, no carotid bruit, no JVD, supple, symmetrical, trachea midline, and thyroid not enlarged, symmetric, no tenderness/mass/nodules Lungs: clear to auscultation bilaterally Heart: regular rate and rhythm, S1, S2 normal, no murmur, click, rub or gallop Extremities: extremities normal, atraumatic, no cyanosis or edema Pulses: 2+ and symmetric Skin: Skin color, texture, turgor normal. No  rashes or lesions Neurologic: Grossly normal  EKG sinus rhythm at 62 without ST or T wave changes.  I personally reviewed this EKG.  ASSESSMENT AND PLAN:   Peripheral arterial disease [facility]) Mr. Hicklin returns in follow-up of his Doppler studies done because of right calf claudication on 02/17/2021.  His right ABI was 0.92 and his left was 1.18.  He did have a high-frequency signal in his right iliac artery.  His symptoms are lifestyle limiting.  He is very active.  He wishes to pursue endovascular therapy which we will schedule for 03/28/2021.     03/30/2021 MD FACP,FACC,FAHA, [facility] 03/11/2021 3:05 PM

## 2021-03-11 NOTE — Assessment & Plan Note (Signed)
Maurice Park returns in follow-up of his Doppler studies done because of right calf claudication on 02/17/2021.  His right ABI was 0.92 and his left was 1.18.  He did have a high-frequency signal in his right iliac artery.  His symptoms are lifestyle limiting.  He is very active.  He wishes to pursue endovascular therapy which we will schedule for 03/28/2021.

## 2021-03-11 NOTE — Progress Notes (Signed)
   03/11/2021 [place]   10/08/1955  [id number]  Primary Physician Shaw, Kimberlee, MD Primary Cardiologist: Maurice Jefferys J Doreena Maulden MD FACP, FACC, FAHA, FSCAI  HPI:  [place] is a 64 y.o.  widowed (wife died from ALS February 28, 2012) Filipino male, father of 1 son ([place]) who I last saw   in the office 01/26/2021..   His son is about to graduated from [place] moved to [place] where he plans to study prosthetics.  .  He remarried to [place], who works as a stylist at a local salon.  He has a history of CAD and PVOD. I stented his posterolateral branch back February 21, 2000. He had moderate LAD and circumflex disease at that time. He was recatheterization in 2011 revealing 60% "in-stent restenosis" of his PLA stent which I redilated. He also had right renal artery stenosis which I stented as well. Dr. Todd Park performed elective right carotid endarterectomy on him in 2007 which we follow by duplex ultrasound. He developed crescendo angina and was catheterized by Dr. Tom Park December 24, 2009, revealing left main 3-vessel disease. He underwent coronary artery bypass grafting x4 December 20, 2009, with a LIMA to his LAD, a left radial to a diagonal branch, sequential vein to the PDA and PLA system. His postop course was complicated by prolonged paroxysmal atrial fibrillation which he recuperated from nicely. <BR><BR>He had a Myoview stress test performed March 22, 2010, which was nonischemic. Renal Dopplers continue to show widely patent right renal artery stent and carotid Dopplers show patent endarterectomy site. He denies chest pain or shortness of breath. He was catheterized October 02, 2010, revealing an occluded left radial graft to a diagonal branch, patent LIMA to the LAD and a patent sequential vein to the PDA and PLA with normal LV function and patent right renal artery stent. His last lipid profile was a year ago. Since I saw him 18 months ago he's remained completely stable. He is working  out with a trainer 2 days a week. His son [place] is now 23 years old and has gotten into [employer] [place] [employer]..his wife of 25 years, [place], died June 2013 of ALS. He remarried to his current wife [place] on 01/29/16.       He is very active and plays tennis for long periods of time without symptoms.  His lipid profile and hemoglobin A1c are in the therapeutic range.  Recent carotid and renal Dopplers were normal.  He does complain of some right calf claudication however which is new over the last 6 to 8 months.  I obtain lower extremity arterial Dopplers on him 02/17/2021 revealed a right ABI of 0.92 and a left of 1.18.  He did have a high-frequency signal in his right iliac artery suggesting high-grade disease probably contributing to his lifestyle of any claudication.   Current Meds  Medication Sig   aspirin EC 325 MG tablet Take 325 mg by mouth at bedtime.   ezetimibe-simvastatin (VYTORIN) 10-20 MG tablet TAKE 1 TABLET BY MOUTH EVERY DAY   hydrALAZINE (APRESOLINE) 25 MG tablet TAKE 1 TABLET BY MOUTH TWICE A DAY   metoprolol succinate (TOPROL-XL) 50 MG 24 hr tablet TAKE 1 TABLET DAILY WITH OR IMMEDIATELY FOLLOWING A MEAL   Multiple Vitamin (MULTIVITAMIN) capsule Take 1 capsule by mouth daily.   ramipril (ALTACE) 5 MG capsule TAKE 1 CAPSULE BY MOUTH EVERY DAY   ranolazine (RANEXA) 500 MG 12 hr tablet TAKE 1 TABLET BY MOUTH   TWICE A DAY   [DISCONTINUED] OneTouch Delica Lancets 33G MISC Use to check Blood sugar 2 x daily DX: R73.03     Allergies  Allergen Reactions   Hydrocortisone Hives   Codeine Nausea Only   Contrast Media [Iodinated Diagnostic Agents] Hives   Neosporin [Neomycin-Polymyxin-Gramicidin] Rash    Social History   Socioeconomic History   Marital status: Married    Spouse name: Not on file   Number of children: 1   Years of education: BSBA   Highest education level: Not on file  Occupational History   Occupation: Sr Nutritional therapist  Tobacco Use   Smoking status:  Former    Packs/day: 1.00    Years: 12.00    Pack years: 12.00    Types: Cigarettes    Quit date: 11/09/1981    Years since quitting: 39.3   Smokeless tobacco: Never  Substance and Sexual Activity   Alcohol use: Yes    Alcohol/week: 3.0 standard drinks    Types: 3 Cans of beer per week    Comment: last drink was in november   Drug use: No   Sexual activity: Not Currently    Comment: Right Carotid endarectomy  Other Topics Concern   Not on file  Social History Narrative   Not on file   Social Determinants of Health   Financial Resource Strain: Not on file  Food Insecurity: Not on file  Transportation Needs: Not on file  Physical Activity: Not on file  Stress: Not on file  Social Connections: Not on file  Intimate Partner Violence: Not on file     Review of Systems: General: negative for chills, fever, night sweats or weight changes.  Cardiovascular: negative for chest pain, dyspnea on exertion, edema, orthopnea, palpitations, paroxysmal nocturnal dyspnea or shortness of breath Dermatological: negative for rash Respiratory: negative for cough or wheezing Urologic: negative for hematuria Abdominal: negative for nausea, vomiting, diarrhea, bright red blood per rectum, melena, or hematemesis Neurologic: negative for visual changes, syncope, or dizziness All other systems reviewed and are otherwise negative except as noted above.    Blood pressure 127/72, pulse 62, height 5\' 4"  (1.626 m), weight 182 lb 12.8 oz (82.9 kg), SpO2 94 %.  General appearance: alert and no distress Neck: no adenopathy, no carotid bruit, no JVD, supple, symmetrical, trachea midline, and thyroid not enlarged, symmetric, no tenderness/mass/nodules Lungs: clear to auscultation bilaterally Heart: regular rate and rhythm, S1, S2 normal, no murmur, click, rub or gallop Extremities: extremities normal, atraumatic, no cyanosis or edema Pulses: 2+ and symmetric Skin: Skin color, texture, turgor normal. No  rashes or lesions Neurologic: Grossly normal  EKG sinus rhythm at 62 without ST or T wave changes.  I personally reviewed this EKG.  ASSESSMENT AND PLAN:   Peripheral arterial disease [facility]) Mr. Hicklin returns in follow-up of his Doppler studies done because of right calf claudication on 02/17/2021.  His right ABI was 0.92 and his left was 1.18.  He did have a high-frequency signal in his right iliac artery.  His symptoms are lifestyle limiting.  He is very active.  He wishes to pursue endovascular therapy which we will schedule for 03/28/2021.     03/30/2021 MD FACP,FACC,FAHA, [facility] 03/11/2021 3:05 PM

## 2021-03-11 NOTE — Patient Instructions (Signed)
    [facility] CARDIOVASCULAR DIVISION [facility] [place] [address] [address] [place] [address] Dept: [phone] Loc: [phone]  MAKAVELI HOARD  03/11/2021  You are scheduled for a Peripheral Angiogram on Monday, July 18 with Dr. Nanetta Batty.  1. Please arrive at the [facility] (Main Entrance A) at [facility]: [address] [place], [place] [address] at 5:30 AM (This time is two hours before your procedure to ensure your preparation). Free valet parking service is available.   Special note: Every effort is made to have your procedure done on time. Please understand that emergencies sometimes delay scheduled procedures.  2. Diet: Do not eat solid foods after midnight.  The patient may have clear liquids until 5am upon the day of the procedure.  3. Labs: You will need to have blood drawn today  4. Medication instructions in preparation for your procedure:   Contrast Allergy: Yes, Please take Prednisone 50mg  by mouth at: Thirteen hours prior to cath  Seven hours prior to cath  And prior to leaving home please take last dose of Prednisone 50mg  and Benadryl 50mg  by mouth.   On the morning of your procedure, take your Aspirin and any morning medicines NOT listed above.  You may use sips of water.  5. Plan for one night stay--bring personal belongings. 6. Bring a current list of your medications and current insurance cards. 7. You MUST have a responsible person to drive you home. 8. Someone MUST be with you the first 24 hours after you arrive home or your discharge will be delayed. 9. Please wear clothes that are easy to get on and off and wear slip-on shoes.  Your physician has requested that you have a lower extremity arterial exercise duplex. During this test, exercise and ultrasound are used to evaluate arterial blood flow in the legs. Allow one hour for this exam. There are no restrictions or special instructions. 1 weeks  after procedure  Your physician recommends that you schedule a follow-up appointment in: 2 weeks after procedure  Thank you for allowing to care for you!   -- [facility]

## 2021-03-12 LAB — CBC
Hematocrit: 44.8 % (ref 37.5–51.0)
Hemoglobin: 14.8 g/dL (ref 13.0–17.7)
MCH: 31.8 pg (ref 26.6–33.0)
MCHC: 33 g/dL (ref 31.5–35.7)
MCV: 96 fL (ref 79–97)
Platelets: 186 10*3/uL (ref 150–450)
RBC: 4.66 x10E6/uL (ref 4.14–5.80)
RDW: 12.2 % (ref 11.6–15.4)
WBC: 6.3 10*3/uL (ref 3.4–10.8)

## 2021-03-12 LAB — BASIC METABOLIC PANEL
BUN/Creatinine Ratio: 13 (ref 10–24)
BUN: 17 mg/dL (ref 8–27)
CO2: 25 mmol/L (ref 20–29)
Calcium: 9.7 mg/dL (ref 8.6–10.2)
Chloride: 105 mmol/L (ref 96–106)
Creatinine, Ser: 1.34 mg/dL — ABNORMAL HIGH (ref 0.76–1.27)
Glucose: 89 mg/dL (ref 65–99)
Potassium: 4.7 mmol/L (ref 3.5–5.2)
Sodium: 144 mmol/L (ref 134–144)
eGFR: 60 mL/min/{1.73_m2} (ref >59)

## 2021-03-21 ENCOUNTER — Other Ambulatory Visit (HOSPITAL_COMMUNITY): Payer: Self-pay | Admitting: Cardiovascular Disease

## 2021-03-21 DIAGNOSIS — I739 Peripheral vascular disease, unspecified: Secondary | ICD-10-CM

## 2021-03-22 ENCOUNTER — Telehealth (INDEPENDENT_AMBULATORY_CARE_PROVIDER_SITE_OTHER): Payer: 59 | Admitting: Adult Health

## 2021-03-22 DIAGNOSIS — R7303 Prediabetes: Secondary | ICD-10-CM | POA: Diagnosis not present

## 2021-03-22 DIAGNOSIS — E669 Obesity, unspecified: Secondary | ICD-10-CM | POA: Diagnosis not present

## 2021-03-22 DIAGNOSIS — I739 Peripheral vascular disease, unspecified: Secondary | ICD-10-CM | POA: Diagnosis not present

## 2021-03-22 DIAGNOSIS — Z6832 Body mass index (BMI) 32.0-32.9, adult: Secondary | ICD-10-CM

## 2021-03-24 ENCOUNTER — Telehealth: Payer: Self-pay | Admitting: *Deleted

## 2021-03-24 NOTE — Progress Notes (Signed)
TeleHealth Visit:  Due to the COVID-19 pandemic, this visit was completed with telemedicine (audio/video) technology to reduce patient and provider exposure as well as to preserve personal protective equipment.   [place] has verbally consented to this TeleHealth visit. The patient is located at home, the provider is located at the [facility] office. The participants in this visit include the listed provider and patient. The visit was conducted today via video.  Chief Complaint: OBESITY [place] is here to discuss his progress with his obesity treatment plan along with follow-up of his obesity related diagnoses. [place] is on the Category 2 Plan and keeping a food journal and adhering to recommended goals of 400-500 calories and 35 g protein with supper and states he is following his eating plan approximately 60% of the time. [place] states he is walking and playing tennis 90 minutes 2 times per week.  Today's visit was #: 10 Starting weight: 186 lbs Starting date: 09/16/2020  Interim History: [place] woke up Saturday, 03/19/2021, with unilateral sinus pressure (left side), nasal drainage, OS watery drainage. Symptoms much improved with Flonase and OTC antihistamine.  He denies cough, fever, or GI upset. He has not tested for COVID-19 virus. He denies known exposure to COVID-19.   Subjective:   1. Pre-diabetes 12/16/20 BG 85 and insulin level 8.4. [place]'s A1c is 5.7, which is improved from 5.9 on 08/03/2020.  2. Peripheral arterial disease (HCC) Right calf claudication- last 6-8 months. 02/17/21 Right ABI was 0.91 and his left ABI was 1.18. He is scheduled for abdominal aortogram with lower extremities on 03/28/21.  Assessment/Plan:   1. Pre-diabetes [place] will continue to work on weight loss, exercise, and decreasing simple carbohydrates to help decrease the risk of diabetes.  Continue to increase protein and limit carbohydrate snacking.  2. Peripheral arterial disease  (HCC) Follow up with cardiology as directed.  3. Obesity, current BMI 33.2  [place] is currently in the action stage of change. As such, his goal is to continue with weight loss efforts. He has agreed to the Category 2 Plan and keeping a food journal and adhering to recommended goals of 400-500 calories and 35 g protein with supper.   Protein grams per each meal provided via MyChart.  Exercise goals:  As is  Behavioral modification strategies: increasing lean protein intake, decreasing simple carbohydrates, meal planning and cooking strategies, keeping healthy foods in the home, better snacking choices, and planning for success.  [place] has agreed to follow-up with our clinic in 2-3 weeks. He was informed of the importance of frequent follow-up visits to maximize his success with intensive lifestyle modifications for his multiple health conditions.  Objective:   VITALS: Per patient if applicable, see vitals. GENERAL: Alert and in no acute distress. CARDIOPULMONARY: No increased WOB. Speaking in clear sentences.  PSYCH: Pleasant and cooperative. Speech normal rate and rhythm. Affect is appropriate. Insight and judgement are appropriate. Attention is focused, linear, and appropriate.  NEURO: Oriented as arrived to appointment on time with no prompting.   Lab Results  Component Value Date   CREATININE 1.34 (H) 03/11/2021   BUN 17 03/11/2021   NA 144 03/11/2021   K 4.7 03/11/2021   CL 105 03/11/2021   CO2 25 03/11/2021   Lab Results  Component Value Date   ALT 44 12/16/2020   AST 34 12/16/2020   ALKPHOS 70 12/16/2020   BILITOT 0.4 12/16/2020   Lab Results  Component Value Date   HGBA1C 5.7 (H) 12/16/2020  HGBA1C 5.9 08/03/2020   HGBA1C 5.9 (H) 10/02/2011   Lab Results  Component Value Date   INSULIN 8.4 12/16/2020   Lab Results  Component Value Date   TSH 1.263 10/02/2011   Lab Results  Component Value Date   CHOL 136 12/16/2020   HDL 45 12/16/2020   LDLCALC 74  12/16/2020   TRIG 91 12/16/2020   CHOLHDL 3.0 12/16/2020   No results found for: VD25OH Lab Results  Component Value Date   WBC 6.3 03/11/2021   HGB 14.8 03/11/2021   HCT 44.8 03/11/2021   MCV 96 03/11/2021   PLT 186 03/11/2021   No results found for: IRON, TIBC, FERRITIN  Attestation Statements:   Reviewed by clinician on day of visit: allergies, medications, problem list, medical history, surgical history, family history, social history, and previous encounter notes.  Time spent on visit including pre-visit chart review and post-visit charting and care was 28 minutes.   Edmund [place], CMA, am acting as transcriptionist for William Hamburger, NP.  I have reviewed the above documentation for accuracy and completeness, and I agree with the above. - Reno Clasby d. Donda Friedli, NP-C

## 2021-03-24 NOTE — Telephone Encounter (Addendum)
Pt contacted pre-catheterization scheduled at [facility] for: Monday March 28, 2021 7:30 AM Verified arrival time and place: [facility] Main Entrance A [facility]) at: 5:30 AM   No solid food after midnight prior to cath, clear liquids until 5 AM day of procedure.  CONTRAST ALLERGY: yes- 13 hour Prednisone and Benadryl Prep: 03/27/21 Prednisone 50 mg 6:30 PM 03/28/21 Prednisone 50 mg 12:30 AM 03/28/21 Prednisone 50 mg and Benadryl 50 mg just prior to leaving home for hospital Do not drive  Except hold medications AM meds can be  taken pre-cath with sips of water including: aspirin 81 mg Prednisone 50 mg Benadryl 50 mg  Confirmed patient has responsible adult to drive home post procedure and be with patient first 24 hours after arriving home: yes  You are allowed ONE visitor in the waiting room during the time you are at the hospital for your procedure. Both you and your visitor must wear a mask once you enter the hospital.   Patient reports does not currently have any symptoms concerning for COVID-19 and no household members with COVID-19 like illness.     Reviewed procedure/mask/visitor instructions with [name] (CPR).

## 2021-03-28 ENCOUNTER — Other Ambulatory Visit: Payer: Self-pay

## 2021-03-28 ENCOUNTER — Ambulatory Visit (HOSPITAL_COMMUNITY)
Admission: RE | Admit: 2021-03-28 | Discharge: 2021-03-28 | Disposition: A | Discharge status: Home / Self Care | Payer: 59 | Attending: Cardiovascular Disease | Admitting: Cardiovascular Disease

## 2021-03-28 ENCOUNTER — Encounter (HOSPITAL_COMMUNITY)
Admission: RE | Disposition: A | Discharge status: Home / Self Care | Payer: Self-pay | Source: Home / Self Care | Attending: Cardiovascular Disease

## 2021-03-28 DIAGNOSIS — Z91041 Radiographic dye allergy status: Secondary | ICD-10-CM | POA: Diagnosis not present

## 2021-03-28 DIAGNOSIS — Z883 Allergy status to other anti-infective agents status: Secondary | ICD-10-CM | POA: Insufficient documentation

## 2021-03-28 DIAGNOSIS — I708 Atherosclerosis of other arteries: Secondary | ICD-10-CM | POA: Insufficient documentation

## 2021-03-28 DIAGNOSIS — I739 Peripheral vascular disease, unspecified: Secondary | ICD-10-CM | POA: Diagnosis not present

## 2021-03-28 DIAGNOSIS — Z79899 Other long term (current) drug therapy: Secondary | ICD-10-CM | POA: Diagnosis not present

## 2021-03-28 DIAGNOSIS — Z951 Presence of aortocoronary bypass graft: Secondary | ICD-10-CM | POA: Diagnosis not present

## 2021-03-28 DIAGNOSIS — I2510 Atherosclerotic heart disease of native coronary artery without angina pectoris: Secondary | ICD-10-CM | POA: Insufficient documentation

## 2021-03-28 DIAGNOSIS — Z888 Allergy status to other drugs, medicaments and biological substances status: Secondary | ICD-10-CM | POA: Insufficient documentation

## 2021-03-28 DIAGNOSIS — I70211 Atherosclerosis of native arteries of extremities with intermittent claudication, right leg: Secondary | ICD-10-CM

## 2021-03-28 DIAGNOSIS — Z885 Allergy status to narcotic agent status: Secondary | ICD-10-CM | POA: Insufficient documentation

## 2021-03-28 DIAGNOSIS — Z7982 Long term (current) use of aspirin: Secondary | ICD-10-CM | POA: Insufficient documentation

## 2021-03-28 DIAGNOSIS — I701 Atherosclerosis of renal artery: Secondary | ICD-10-CM | POA: Insufficient documentation

## 2021-03-28 HISTORY — PX: PERIPHERAL VASCULAR INTERVENTION: CATH118257

## 2021-03-28 HISTORY — PX: ABDOMINAL AORTOGRAM W/LOWER EXTREMITY: CATH118223

## 2021-03-28 LAB — POCT ACTIVATED CLOTTING TIME
Activated Clotting Time: 306 seconds
Activated Clotting Time: 225 seconds
Activated Clotting Time: 161 seconds

## 2021-03-28 SURGERY — ABDOMINAL AORTOGRAM W/LOWER EXTREMITY
Anesthesia: LOCAL

## 2021-03-28 MED ORDER — CLOPIDOGREL BISULFATE 300 MG PO TABS
ORAL_TABLET | ORAL | Status: DC | PRN
  Administered 2021-03-28: 300 mg via ORAL

## 2021-03-28 MED ORDER — MIDAZOLAM HCL 2 MG/2ML IJ SOLN
INTRAMUSCULAR | Status: AC
  Filled 2021-03-28: qty 2

## 2021-03-28 MED ORDER — HEPARIN (PORCINE) IN NACL 1000-0.9 UT/500ML-% IV SOLN
INTRAVENOUS | Status: DC | PRN
  Administered 2021-03-28 (×2): 500 mL

## 2021-03-28 MED ORDER — LIDOCAINE HCL (PF) 1 % IJ SOLN
INTRAMUSCULAR | Status: DC | PRN
  Administered 2021-03-28: 15 mL via SUBCUTANEOUS

## 2021-03-28 MED ORDER — SODIUM CHLORIDE 0.9 % IV SOLN: 250.0000 mL | INTRAVENOUS | Status: DC | PRN

## 2021-03-28 MED ORDER — HYDRALAZINE HCL 20 MG/ML IJ SOLN: 5.0000 mg | INTRAMUSCULAR | Status: DC | PRN

## 2021-03-28 MED ORDER — ASPIRIN EC 81 MG PO TBEC
81.0000 mg | DELAYED_RELEASE_TABLET | Freq: Every day | ORAL | Status: DC
  Administered 2021-03-28: 81 mg via ORAL
  Filled 2021-03-28: qty 1

## 2021-03-28 MED ORDER — ASPIRIN 81 MG PO CHEW: 81.0000 mg | CHEWABLE_TABLET | ORAL | Status: DC

## 2021-03-28 MED ORDER — FENTANYL CITRATE (PF) 100 MCG/2ML IJ SOLN
INTRAMUSCULAR | Status: AC
  Filled 2021-03-28: qty 2

## 2021-03-28 MED ORDER — HEPARIN (PORCINE) IN NACL 1000-0.9 UT/500ML-% IV SOLN
INTRAVENOUS | Status: AC
  Filled 2021-03-28: qty 1000

## 2021-03-28 MED ORDER — CLOPIDOGREL BISULFATE 75 MG PO TABS: 75.0000 mg | ORAL_TABLET | Freq: Every day | ORAL | Status: DC

## 2021-03-28 MED ORDER — SODIUM CHLORIDE 0.9 % IV SOLN: INTRAVENOUS | Status: AC

## 2021-03-28 MED ORDER — HEPARIN SODIUM (PORCINE) 1000 UNIT/ML IJ SOLN
INTRAMUSCULAR | Status: AC
  Filled 2021-03-28: qty 1

## 2021-03-28 MED ORDER — ONDANSETRON HCL 4 MG/2ML IJ SOLN: 4.0000 mg | Freq: Four times a day (QID) | INTRAMUSCULAR | Status: DC | PRN

## 2021-03-28 MED ORDER — SODIUM CHLORIDE 0.9 % WEIGHT BASED INFUSION: 1.0000 mL/kg/h | INTRAVENOUS | Status: DC

## 2021-03-28 MED ORDER — LABETALOL HCL 5 MG/ML IV SOLN: 10.0000 mg | INTRAVENOUS | Status: DC | PRN

## 2021-03-28 MED ORDER — SODIUM CHLORIDE 0.9 % WEIGHT BASED INFUSION
3.0000 mL/kg/h | INTRAVENOUS | Status: AC
  Administered 2021-03-28: 3 mL/kg/h via INTRAVENOUS

## 2021-03-28 MED ORDER — SODIUM CHLORIDE 0.9% FLUSH: 3.0000 mL | Freq: Two times a day (BID) | INTRAVENOUS | Status: DC

## 2021-03-28 MED ORDER — LIDOCAINE HCL (PF) 1 % IJ SOLN
INTRAMUSCULAR | Status: AC
  Filled 2021-03-28: qty 30

## 2021-03-28 MED ORDER — FENTANYL CITRATE (PF) 100 MCG/2ML IJ SOLN
INTRAMUSCULAR | Status: DC | PRN
  Administered 2021-03-28: 25 ug via INTRAVENOUS

## 2021-03-28 MED ORDER — ACETAMINOPHEN 325 MG PO TABS: 650.0000 mg | ORAL_TABLET | ORAL | Status: DC | PRN

## 2021-03-28 MED ORDER — MIDAZOLAM HCL 2 MG/2ML IJ SOLN
INTRAMUSCULAR | Status: DC | PRN
  Administered 2021-03-28: 1 mg via INTRAVENOUS

## 2021-03-28 MED ORDER — SODIUM CHLORIDE 0.9% FLUSH: 3.0000 mL | INTRAVENOUS | Status: DC | PRN

## 2021-03-28 MED ORDER — HEPARIN SODIUM (PORCINE) 1000 UNIT/ML IJ SOLN
INTRAMUSCULAR | Status: DC | PRN
  Administered 2021-03-28: 8500 [IU] via INTRAVENOUS

## 2021-03-28 SURGICAL SUPPLY — 19 items
BALLN MUSTANG 5.0X20 75 (BALLOONS) ×3
BALLOON MUSTANG 5.0X20 75 (BALLOONS) ×2 IMPLANT
CATH ANGIO 5F BER2 65CM (CATHETERS) ×3 IMPLANT
CATH ANGIO 5F PIGTAIL 65CM (CATHETERS) ×3 IMPLANT
GUIDEWIRE ANGLED .035X150CM (WIRE) ×3 IMPLANT
KIT ENCORE 26 ADVANTAGE (KITS) ×3 IMPLANT
KIT PV (KITS) ×3 IMPLANT
SHEATH BRITE TIP 7FR 35CM (SHEATH) ×3 IMPLANT
SHEATH PINNACLE 5F 10CM (SHEATH) ×3 IMPLANT
SHEATH PINNACLE 7F 10CM (SHEATH) ×3 IMPLANT
SHEATH PROBE COVER 6X72 (BAG) ×3 IMPLANT
STENT VIABAHN 8X29X80 VBX (Permanent Stent) ×3 IMPLANT
SYR MEDRAD MARK 7 150ML (SYRINGE) ×3 IMPLANT
TAPE VIPERTRACK RADIOPAQ (MISCELLANEOUS) ×2 IMPLANT
TAPE VIPERTRACK RADIOPAQUE (MISCELLANEOUS) ×3
TRANSDUCER W/STOPCOCK (MISCELLANEOUS) ×3 IMPLANT
TRAY PV CATH (CUSTOM PROCEDURE TRAY) ×3 IMPLANT
TUBING CIL FLEX 10 FLL-RA (TUBING) ×3 IMPLANT
WIRE HITORQ VERSACORE ST 145CM (WIRE) ×3 IMPLANT

## 2021-03-28 NOTE — Progress Notes (Signed)
Site area: rt groin fa sheath pulled at 1115 Site Prior to Removal:  Level 0 Pressure Applied For: 60 minutes Manual:   yes Patient Status During Pull:  stable Post Pull Site:  Level 0 Post Pull Instructions Given:  yes Post Pull Pulses Present: rt dp palpable Dressing Applied:  gauze and tegaderm Bedrest begins @ 1215 Comments: Rt groin continued to ooze intermittently when attempted lessoning pressure during hold; no hematoma; Lance Bosch took over manual hold at 1150

## 2021-03-28 NOTE — Interval H&P Note (Signed)
History and Physical Interval Note:  03/28/2021 7:50 AM  [name]  has presented today for surgery, with the diagnosis of PAD.  The various methods of treatment have been discussed with the patient and family. After consideration of risks, benefits and other options for treatment, the patient has consented to  Procedure(s): ABDOMINAL AORTOGRAM W/LOWER EXTREMITY (N/A) as a surgical intervention.  The patient's history has been reviewed, patient examined, no change in status, stable for surgery.  I have reviewed the patient's chart and labs.  Questions were answered to the patient's satisfaction.     Nanetta Batty

## 2021-03-29 ENCOUNTER — Encounter (HOSPITAL_COMMUNITY): Payer: Self-pay | Admitting: Cardiovascular Disease

## 2021-04-04 ENCOUNTER — Ambulatory Visit (HOSPITAL_COMMUNITY)
Admission: RE | Admit: 2021-04-04 | Discharge: 2021-04-04 | Disposition: A | Discharge status: Home / Self Care | Payer: 59 | Source: Ambulatory Visit | Attending: Cardiovascular Disease | Admitting: Cardiovascular Disease

## 2021-04-04 ENCOUNTER — Other Ambulatory Visit: Payer: Self-pay

## 2021-04-04 ENCOUNTER — Other Ambulatory Visit (HOSPITAL_COMMUNITY): Payer: Self-pay | Admitting: Cardiovascular Disease

## 2021-04-04 DIAGNOSIS — Z95828 Presence of other vascular implants and grafts: Secondary | ICD-10-CM

## 2021-04-04 DIAGNOSIS — I739 Peripheral vascular disease, unspecified: Secondary | ICD-10-CM | POA: Diagnosis not present

## 2021-04-15 ENCOUNTER — Encounter: Payer: Self-pay | Admitting: *Deleted

## 2021-04-19 ENCOUNTER — Ambulatory Visit: Payer: 59 | Admitting: Cardiovascular Disease

## 2021-04-19 ENCOUNTER — Other Ambulatory Visit: Payer: Self-pay

## 2021-04-19 ENCOUNTER — Encounter: Payer: Self-pay | Admitting: Cardiovascular Disease

## 2021-04-19 DIAGNOSIS — E782 Mixed hyperlipidemia: Secondary | ICD-10-CM

## 2021-04-19 DIAGNOSIS — I739 Peripheral vascular disease, unspecified: Secondary | ICD-10-CM

## 2021-04-19 NOTE — Assessment & Plan Note (Signed)
History of PAD status post remote right renal artery stenting, carotid endarterectomy and most recently right common carotid orbital atherectomy and covered stenting using a VBX 8 x 29 mm stent 03/28/2021.  His ABI improved from 0.92 up to greater than 1, his waveforms and velocities normalized and his hip and calf pain have resolved.  He is back playing tennis without limitation.  We will get aortoiliac Dopplers in 6 months I will see him back in 1 year for follow-up.

## 2021-04-19 NOTE — Patient Instructions (Signed)
Medication Instructions:  Your physician recommends that you continue on your current medications as directed. Please refer to the Current Medication list given to you today.  *If you need a refill on your cardiac medications before your next appointment, please call your pharmacy*  Testing/Procedures: Aorta-illac doppler in 6 months  Follow-Up: At [facility], you and your health needs are our priority.  As part of our continuing mission to provide you with exceptional heart care, we have created designated Provider Care Teams.  These Care Teams include your primary Cardiologist (physician) and Advanced Practice Providers (APPs -  Physician Assistants and Nurse Practitioners) who all work together to provide you with the care you need, when you need it.  We recommend signing up for the patient portal called "MyChart".  Sign up information is provided on this After Visit Summary.  MyChart is used to connect with patients for Virtual Visits (Telemedicine).  Patients are able to view lab/test results, encounter notes, upcoming appointments, etc.  Non-urgent messages can be sent to your provider as well.   To learn more about what you can do with MyChart, go to [email or web address].    Your next appointment:   12 month(s)  The format for your next appointment:   In Person  Provider:   Nanetta Batty, MD

## 2021-04-19 NOTE — Progress Notes (Signed)
04/19/2021 [name]   1955/11/17  [id number]  Primary Physician Lupita Raider, MD Primary Cardiologist: Runell Gess MD Milagros Loll, [place], [place]  HPI:  Maurice Park is a 65 y.o.  widowed (wife died from ALS 03-20-12) [place] male, father of 1 son [name]) who I last saw in the office 03/11/2021.Marland Kitchen   His son is about to graduated from [employer] moved to [place]. [facility] where he plans to study prosthetics.  Marland Kitchen  He remarried to [name], who works as a Public librarian at a Retail buyer.  He has a history of CAD and PVOD. I stented his posterolateral branch back February 21, 2000. He had moderate LAD and circumflex disease at that time. He was recatheterization in 2011 revealing 60% "in-stent restenosis" of his PLA stent which I redilated. He also had right renal artery stenosis which I stented as well. Dr. Tawanna Cooler Early performed elective right carotid endarterectomy on him in 2007 which we follow by duplex ultrasound. He developed crescendo angina and was catheterized by Dr. Daphene Jaeger December 24, 2009, revealing left main 3-vessel disease. He underwent coronary artery bypass grafting x4 December 20, 2009, with a LIMA to his LAD, a left radial to a diagonal branch, sequential vein to the PDA and PLA system. His postop course was complicated by prolonged paroxysmal atrial fibrillation which he recuperated from nicely. <BR><BR>He had a Myoview stress test performed March 22, 2010, which was nonischemic. Renal Dopplers continue to show widely patent right renal artery stent and carotid Dopplers show patent endarterectomy site. He denies chest pain or shortness of breath. He was catheterized October 02, 2010, revealing an occluded left radial graft to a diagonal branch, patent LIMA to the LAD and a patent sequential vein to the PDA and PLA with normal LV function and patent right renal artery stent. His last lipid profile was a year ago. Since I saw him 18 months ago he's remained completely stable. He is working  out with a Psychologist, educational 2 days a week. His son [name] is now 22 years old and has gotten into [employer]his wife of 25 years, [name], died 09-Jul-2013of ALS. He remarried to his current wife [name] on 01/29/16.       He is very active and plays tennis for long periods of time without symptoms.  His lipid profile and hemoglobin A1c are in the therapeutic range.  Recent carotid and renal Dopplers were normal.  He does complain of some right calf claudication however which is new over the last 6 to 8 months.  I obtain lower extremity arterial Dopplers on him 02/17/2021 revealed a right ABI of 0.92 and a left of 1.18.  He did have a high-frequency signal in his right iliac artery suggesting high-grade disease probably contributing to his lifestyle of any claudication.  I performed peripheral angiography, orbital atherectomy and VBX covered stenting of a high-grade calcified proximal right common iliac artery stenosis with an excellent result.  He was discharged home the following day.  His Dopplers performed 04/04/2021 showed normalization of an 8 of his ABI and his velocities.  His hip pain and claudication have resolved.  He is back playing tennis without limitation.  Current Meds  Medication Sig   acetaminophen (TYLENOL) 650 MG CR tablet Take 1,300 mg by mouth every 8 (eight) hours as needed for pain.   aspirin EC 81 MG tablet Take 81 mg by mouth at bedtime.   ezetimibe-simvastatin (VYTORIN) 10-20 MG  tablet TAKE 1 TABLET BY MOUTH EVERY DAY (Patient taking differently: Take 1 tablet by mouth daily at 6 PM.)   fluticasone (FLONASE) 50 MCG/ACT nasal spray Place 2 sprays into both nostrils daily.   hydrALAZINE (APRESOLINE) 25 MG tablet TAKE 1 TABLET BY MOUTH TWICE A DAY (Patient taking differently: Take 25 mg by mouth 2 (two) times daily.)   metoprolol succinate (TOPROL-XL) 50 MG 24 hr tablet TAKE 1 TABLET DAILY WITH OR IMMEDIATELY FOLLOWING A MEAL (Patient taking differently: Take 50 mg by mouth  daily. OR IMMEDIATELY FOLLOWING A MEAL)   Multiple Vitamin (MULTIVITAMIN) capsule Take 1 capsule by mouth daily.   predniSONE (DELTASONE) 50 MG tablet As directed   ramipril (ALTACE) 5 MG capsule TAKE 1 CAPSULE BY MOUTH EVERY DAY (Patient taking differently: Take 5 mg by mouth daily.)   ranolazine (RANEXA) 500 MG 12 hr tablet TAKE 1 TABLET BY MOUTH TWICE A DAY (Patient taking differently: Take 500 mg by mouth 2 (two) times daily.)   sildenafil (VIAGRA) 100 MG tablet Take 100 mg by mouth daily as needed for erectile dysfunction.     Allergies  Allergen Reactions   Hydrocortisone Hives   Other Other (See Comments)    Any topical bacitracin cream   Codeine Nausea Only   Contrast Media [Iodinated Diagnostic Agents] Hives   Neosporin [Neomycin-Polymyxin-Gramicidin] Rash    Social History   Socioeconomic History   Marital status: Media planner    Spouse name: Not on file   Number of children: 1   Years of education: BSBA   Highest education level: Not on file  Occupational History   Occupation: Sr Nutritional therapist  Tobacco Use   Smoking status: Former    Packs/day: 1.00    Years: 12.00    Pack years: 12.00    Types: Cigarettes    Quit date: 11/09/1981    Years since quitting: 39.4   Smokeless tobacco: Never  Substance and Sexual Activity   Alcohol use: Yes    Alcohol/week: 3.0 standard drinks    Types: 3 Cans of beer per week    Comment: last drink was in november   Drug use: No   Sexual activity: Not Currently    Comment: Right Carotid endarectomy  Other Topics Concern   Not on file  Social History Narrative   Not on file   Social Determinants of Health   Financial Resource Strain: Not on file  Food Insecurity: Not on file  Transportation Needs: Not on file  Physical Activity: Not on file  Stress: Not on file  Social Connections: Not on file  Intimate Partner Violence: Not on file     Review of Systems: General: negative for chills, fever, night sweats or  weight changes.  Cardiovascular: negative for chest pain, dyspnea on exertion, edema, orthopnea, palpitations, paroxysmal nocturnal dyspnea or shortness of breath Dermatological: negative for rash Respiratory: negative for cough or wheezing Urologic: negative for hematuria Abdominal: negative for nausea, vomiting, diarrhea, bright red blood per rectum, melena, or hematemesis Neurologic: negative for visual changes, syncope, or dizziness All other systems reviewed and are otherwise negative except as noted above.    Blood pressure 128/64, pulse 64, height 5\' 4"  (1.626 m), weight 184 lb (83.5 kg).  General appearance: alert and no distress Neck: no adenopathy, no carotid bruit, no JVD, supple, symmetrical, trachea midline, and thyroid not enlarged, symmetric, no tenderness/mass/nodules Lungs: clear to auscultation bilaterally Heart: regular rate and rhythm, S1, S2 normal, no murmur, click, rub or gallop  Extremities: extremities normal, atraumatic, no cyanosis or edema Pulses: 2+ and symmetric Skin: Skin color, texture, turgor normal. No rashes or lesions Neurologic: Grossly normal  EKG not performed today  ASSESSMENT AND PLAN:   Peripheral arterial disease (HCC) History of PAD status post remote right renal artery stenting, carotid endarterectomy and most recently right common carotid orbital atherectomy and covered stenting using a VBX 8 x 29 mm stent 03/28/2021.  His ABI improved from 0.92 up to greater than 1, his waveforms and velocities normalized and his hip and calf pain have resolved.  He is back playing tennis without limitation.  We will get aortoiliac Dopplers in 6 months I will see him back in 1 year for follow-up.     Runell Gess MD FACP,FACC,FAHA, [facility] 04/19/2021 2:20 PM

## 2021-07-29 ENCOUNTER — Other Ambulatory Visit: Payer: Self-pay

## 2021-07-29 ENCOUNTER — Ambulatory Visit (HOSPITAL_COMMUNITY)
Admission: RE | Admit: 2021-07-29 | Discharge: 2021-07-29 | Disposition: A | Discharge status: Home / Self Care | Payer: 59 | Source: Ambulatory Visit | Attending: Cardiovascular Disease | Admitting: Cardiovascular Disease

## 2021-07-29 ENCOUNTER — Ambulatory Visit (HOSPITAL_BASED_OUTPATIENT_CLINIC_OR_DEPARTMENT_OTHER)
Admission: RE | Admit: 2021-07-29 | Discharge: 2021-07-29 | Disposition: A | Discharge status: Home / Self Care | Payer: 59 | Source: Ambulatory Visit | Attending: Cardiovascular Disease | Admitting: Cardiovascular Disease

## 2021-07-29 DIAGNOSIS — I701 Atherosclerosis of renal artery: Secondary | ICD-10-CM | POA: Diagnosis not present

## 2021-07-29 DIAGNOSIS — I6521 Occlusion and stenosis of right carotid artery: Secondary | ICD-10-CM | POA: Insufficient documentation

## 2021-09-08 ENCOUNTER — Other Ambulatory Visit: Payer: Self-pay | Admitting: Cardiovascular Disease

## 2021-09-13 NOTE — Assessment & Plan Note (Signed)
History of hyperlipidemia with lipid profile performed by his PCP 08/15/2021 revealing total cholesterol 142, LDL 76 and HDL 46.

## 2021-10-31 ENCOUNTER — Ambulatory Visit (HOSPITAL_COMMUNITY)
Admission: RE | Admit: 2021-10-31 | Payer: 59 | Source: Ambulatory Visit | Attending: Cardiovascular Disease | Admitting: Cardiovascular Disease

## 2021-11-25 ENCOUNTER — Ambulatory Visit (HOSPITAL_BASED_OUTPATIENT_CLINIC_OR_DEPARTMENT_OTHER)
Admission: RE | Admit: 2021-11-25 | Discharge: 2021-11-25 | Disposition: A | Discharge status: Home / Self Care | Payer: 59 | Source: Ambulatory Visit | Attending: Cardiovascular Disease | Admitting: Cardiovascular Disease

## 2021-11-25 ENCOUNTER — Other Ambulatory Visit: Payer: Self-pay

## 2021-11-25 ENCOUNTER — Ambulatory Visit (HOSPITAL_COMMUNITY)
Admission: RE | Admit: 2021-11-25 | Discharge: 2021-11-25 | Disposition: A | Discharge status: Home / Self Care | Payer: 59 | Source: Ambulatory Visit | Attending: Internal Medicine | Admitting: Internal Medicine

## 2021-11-25 DIAGNOSIS — I739 Peripheral vascular disease, unspecified: Secondary | ICD-10-CM

## 2021-11-25 DIAGNOSIS — Z95828 Presence of other vascular implants and grafts: Secondary | ICD-10-CM | POA: Insufficient documentation

## 2022-02-08 ENCOUNTER — Other Ambulatory Visit: Payer: Self-pay | Admitting: Cardiovascular Disease

## 2022-02-19 ENCOUNTER — Other Ambulatory Visit: Payer: Self-pay | Admitting: Cardiovascular Disease

## 2022-02-25 ENCOUNTER — Other Ambulatory Visit: Payer: Self-pay | Admitting: Cardiovascular Disease

## 2022-04-19 ENCOUNTER — Encounter (INDEPENDENT_AMBULATORY_CARE_PROVIDER_SITE_OTHER): Payer: Self-pay

## 2022-08-27 ENCOUNTER — Other Ambulatory Visit: Payer: Self-pay | Admitting: Cardiovascular Disease

## 2022-09-29 ENCOUNTER — Other Ambulatory Visit: Payer: Self-pay | Admitting: Sports Medicine

## 2022-09-29 ENCOUNTER — Ambulatory Visit
Admission: RE | Admit: 2022-09-29 | Discharge: 2022-09-29 | Disposition: A | Discharge status: Home / Self Care | Payer: 59 | Source: Ambulatory Visit | Attending: Sports Medicine | Admitting: Sports Medicine

## 2022-09-29 DIAGNOSIS — M25512 Pain in left shoulder: Secondary | ICD-10-CM

## 2022-11-26 ENCOUNTER — Other Ambulatory Visit: Payer: Self-pay | Admitting: Cardiovascular Disease

## 2022-12-11 ENCOUNTER — Other Ambulatory Visit: Payer: Self-pay | Admitting: Cardiovascular Disease

## 2022-12-21 ENCOUNTER — Other Ambulatory Visit: Payer: Self-pay | Admitting: Cardiovascular Disease

## 2023-01-04 ENCOUNTER — Encounter: Payer: Self-pay | Admitting: Cardiovascular Disease

## 2023-01-04 ENCOUNTER — Ambulatory Visit: Payer: 59 | Attending: Cardiovascular Disease | Admitting: Cardiovascular Disease

## 2023-01-04 VITALS — BP 142/80 | HR 57 | Ht 64.0 in | Wt 188.8 lb

## 2023-01-04 DIAGNOSIS — I739 Peripheral vascular disease, unspecified: Secondary | ICD-10-CM

## 2023-01-04 DIAGNOSIS — I10 Essential (primary) hypertension: Secondary | ICD-10-CM | POA: Diagnosis not present

## 2023-01-04 DIAGNOSIS — I701 Atherosclerosis of renal artery: Secondary | ICD-10-CM

## 2023-01-04 DIAGNOSIS — I2510 Atherosclerotic heart disease of native coronary artery without angina pectoris: Secondary | ICD-10-CM | POA: Diagnosis not present

## 2023-01-04 DIAGNOSIS — E782 Mixed hyperlipidemia: Secondary | ICD-10-CM | POA: Diagnosis not present

## 2023-01-04 MED ORDER — SILDENAFIL CITRATE 100 MG PO TABS: 100.0000 mg | ORAL_TABLET | Freq: Every day | ORAL | 2 refills | Status: AC | PRN

## 2023-01-04 NOTE — Assessment & Plan Note (Signed)
History of PAD status post right common iliac artery PTA and stent by myself 03/28/2021 with orbital atherectomy.  I used a VBX covered stent (8 mm by 29 mm).  This resulted in complete normalization of his Dopplers and resolution of his claudication.  He is back playing tennis without limitation.  His most recent lower extremity arterial Doppler studies performed 11/25/2021 revealed this to be widely patent.  We will follow-up aortoiliac Doppler studies.

## 2023-01-04 NOTE — Assessment & Plan Note (Signed)
History of essential hypertension a blood pressure measured today at 142/80.  He is on metoprolol and ramipril.  He was on hydralazine in the past which he stopped several weeks ago.  I have asked him to check his blood pressure at home and if it is lower than it is today he can stay off his hydralazine.

## 2023-01-04 NOTE — Assessment & Plan Note (Signed)
History of CAD status post stenting of the posterolateral branch by myself 02/21/2000.  He had moderate LAD and circumflex disease at that time.  He was recatheterized in 2011 revealing 60% "in-stent restenosis within the PLA stent which I redilated.  He underwent cath by Dr. Kelly/15/2011 revealing left main/three-vessel disease ultimately had CABG x 4 LIMA to his LAD, left radial to diagonal branch, sequential vein to the PDA and PLA.  He was recatheterized 10/02/2010 revealing an occluded left radial graft to diagonal branch, patent LIMA to LAD and patent sequential vein to the PDA and PLA with normal LV function.  He had a patent right renal artery stent at that time.  He has been very active and denies chest pain or shortness of breath.

## 2023-01-04 NOTE — Assessment & Plan Note (Signed)
History of carotid artery disease status post elective right carotid endarterectomy by Dr. Arbie Cookey in 2007 carotid Doppler studies performed 07/29/2021 revealing a widely patent and arterectomy site.  This will be repeated.

## 2023-01-04 NOTE — Progress Notes (Addendum)
01/04/2023 Maurice Park   [birth year]  [id number]  Primary Physician Lupita Raider, MD Primary Cardiologist: Runell Gess MD Milagros Loll, [place], [place]  HPI:  Maurice Park is a 67 y.o.   widowed (wife died from ALS 2012-03-19) [place] male, father of 1 son Maurice Park) who I last saw in the office 04/19/2021.Marland Kitchen   His son graduated from [place] and moved to [place]. [facility] where he plans to study prosthetics. He remarried to Maurice Park, who works as a Public librarian at a Retail buyer.  [name]  changed jobs since I last saw him as a Nutritional therapist and now is under much less stress.  He has a history of CAD and PVOD. I stented his posterolateral branch back February 21, 2000. He had moderate LAD and circumflex disease at that time. He was recatheterization in 2011 revealing 60% "in-stent restenosis" of his PLA stent which I redilated. He also had right renal artery stenosis which I stented as well. Dr. Tawanna Cooler Early performed elective right carotid endarterectomy on him in 2007 which we follow by duplex ultrasound. He developed crescendo angina and was catheterized by Dr. Daphene Jaeger December 24, 2009, revealing left main 3-vessel disease. He underwent coronary artery bypass grafting x4 December 20, 2009, with a LIMA to his LAD, a left radial to a diagonal branch, sequential vein to the PDA and PLA system. His postop course was complicated by prolonged paroxysmal atrial fibrillation which he recuperated from nicely. <BR><BR>He had a Myoview stress test performed March 22, 2010, which was nonischemic. Renal Dopplers continue to show widely patent right renal artery stent and carotid Dopplers show patent endarterectomy site. He denies chest pain or shortness of breath. He was catheterized October 02, 2010, revealing an occluded left radial graft to a diagonal branch, patent LIMA to the LAD and a patent sequential vein to the PDA and PLA with normal LV function and patent right renal artery stent. His last lipid profile was a  year ago. Since I saw him 18 months ago he's remained completely stable. He is working out with a Psychologist, educational 2 days a week. His son Maurice Park is now 1 years old and has gotten into [employer]his wife of 25 years, Maurice Park, died 07-20-2013of ALS. He remarried to his current wife Maurice Park on 01/29/16.       He is very active and plays tennis for long periods of time without symptoms.  His lipid profile and hemoglobin A1c are in the therapeutic range.  Recent carotid and renal Dopplers were normal.  He does complain of some right calf claudication however which is new over the last 6 to 8 months.  I obtain lower extremity arterial Dopplers on him 02/17/2021 revealed a right ABI of 0.92 and a left of 1.18.  He did have a high-frequency signal in his right iliac artery suggesting high-grade disease probably contributing to his lifestyle of any claudication.  I performed peripheral angiography, orbital atherectomy and VBX covered stenting of a high-grade calcified proximal right common iliac artery stenosis with an excellent result.  He was discharged home the following day.  His Dopplers performed 04/04/2021 showed normalization of an 8 of his ABI and his velocities.  His hip pain and claudication have resolved.  He is back playing tennis without limitation.  Since I saw him over a year and a half ago he has remained stable.  He is under less stress since he was.  He still playing tennis.  He denies chest pain or shortness of breath.  Dopplers performed within the last year or 2 showed patent iliac stent, patent left carotid endarterectomy site and patent renal artery stent.   Current Meds  Medication Sig   acetaminophen (TYLENOL) 650 MG CR tablet Take 1,300 mg by mouth every 8 (eight) hours as needed for pain.   aspirin EC 81 MG tablet Take 81 mg by mouth at bedtime.   ezetimibe-simvastatin (VYTORIN) 10-20 MG tablet TAKE 1 TABLET BY MOUTH EVERY DAY   fluticasone (FLONASE) 50 MCG/ACT nasal spray Place 2  sprays into both nostrils daily.   metoprolol succinate (TOPROL-XL) 50 MG 24 hr tablet TAKE 1 TABLET BY MOUTH EVERY DAY WITH OR IMMEDIATELY FOLLOWING A MEAL   Multiple Vitamin (MULTIVITAMIN) capsule Take 1 capsule by mouth daily.   nitroGLYCERIN (NITROSTAT) 0.4 MG SL tablet Place 1 tablet (0.4 mg total) under the tongue every 5 (five) minutes as needed for up to 25 days for chest pain.   ramipril (ALTACE) 5 MG capsule TAKE 1 CAPSULE BY MOUTH EVERY DAY (Patient taking differently: Take 5 mg by mouth daily.)   ranolazine (RANEXA) 500 MG 12 hr tablet Take 500 mg by mouth 2 (two) times daily.   [DISCONTINUED] hydrALAZINE (APRESOLINE) 25 MG tablet Take 1 tablet (25 mg total) by mouth 2 (two) times daily. PATIENT MUST KEEP SCHEDULED APPOINTMENT FOR FUTURE REFILLS. TAKE 1 TABLET BY MOUTH TWICE A DAY   [DISCONTINUED] sildenafil (VIAGRA) 100 MG tablet Take 100 mg by mouth daily as needed for erectile dysfunction.     Allergies  Allergen Reactions   Hydrocortisone Hives   Other Other (See Comments)    Any topical bacitracin cream   Codeine Nausea Only   Contrast Media [Iodinated Contrast Media] Hives   Neosporin [Neomycin-Polymyxin-Gramicidin] Rash    Social History   Socioeconomic History   Marital status: Media planner    Spouse name: Not on file   Number of children: 1   Years of education: BSBA   Highest education level: Not on file  Occupational History   Occupation: Sr Nutritional therapist  Tobacco Use   Smoking status: Former    Packs/day: 1.00    Years: 12.00    Additional pack years: 0.00    Total pack years: 12.00    Types: Cigarettes    Quit date: 11/09/1981    Years since quitting: 41.1   Smokeless tobacco: Never  Substance and Sexual Activity   Alcohol use: Yes    Alcohol/week: 3.0 standard drinks of alcohol    Types: 3 Cans of beer per week    Comment: last drink was in november   Drug use: No   Sexual activity: Not Currently    Comment: Right Carotid endarectomy   Other Topics Concern   Not on file  Social History Narrative   Not on file   Social Determinants of Health   Financial Resource Strain: Not on file  Food Insecurity: Not on file  Transportation Needs: Not on file  Physical Activity: Not on file  Stress: Not on file  Social Connections: Not on file  Intimate Partner Violence: Not on file     Review of Systems: General: negative for chills, fever, night sweats or weight changes.  Cardiovascular: negative for chest pain, dyspnea on exertion, edema, orthopnea, palpitations, paroxysmal nocturnal dyspnea or shortness of breath Dermatological: negative for rash Respiratory: negative for cough or wheezing Urologic: negative for hematuria Abdominal: negative for nausea, vomiting, diarrhea, bright red blood per  rectum, melena, or hematemesis Neurologic: negative for visual changes, syncope, or dizziness All other systems reviewed and are otherwise negative except as noted above.    Blood pressure (!) 142/80, pulse (!) 57, height 5\' 4"  (1.626 m), weight 188 lb 12.8 oz (85.6 kg).  General appearance: alert and no distress Neck: no adenopathy, no carotid bruit, no JVD, supple, symmetrical, trachea midline, and thyroid not enlarged, symmetric, no tenderness/mass/nodules Lungs: clear to auscultation bilaterally Heart: regular rate and rhythm, S1, S2 normal, no murmur, click, rub or gallop Extremities: extremities normal, atraumatic, no cyanosis or edema Pulses: 2+ and symmetric Skin: Skin color, texture, turgor normal. No rashes or lesions Neurologic: Grossly normal  EKG sinus bradycardia 57 with nonspecific ST and T wave changes, lateral T wave inversion lead I and L.  I personally reviewed this EKG.  ASSESSMENT AND PLAN:   CAD (coronary artery disease), with CABG [place] to LAD, Lt. radial artery to 1st diagnal, sequential SVG to PDA and PLA, 12/20/09 History of CAD status post stenting of the posterolateral branch by myself 02/21/2000.   He had moderate LAD and circumflex disease at that time.  He was recatheterized in 2011 revealing 60% "in-stent restenosis within the PLA stent which I redilated.  He underwent cath by Dr. Kelly/15/2011 revealing left main/three-vessel disease ultimately had CABG x 4 LIMA to his LAD, left radial to diagonal branch, sequential vein to the PDA and PLA.  He was recatheterized 10/02/2010 revealing an occluded left radial graft to diagonal branch, patent LIMA to LAD and patent sequential vein to the PDA and PLA with normal LV function.  He had a patent right renal artery stent at that time.  He has been very active and denies chest pain or shortness of breath.  Hyperlipidemia, controlled Hyperlipidemia on Vytorin followed by his PCP.  His most recent lipid profile performed 08/28/2022 revealed total cholesterol 137, LDL of 66 and HDL 53.  Renal artery stenosis (HCC) History of right renal artery stent with recent renal Doppler studies performed 07/29/2021 revealing a widely patent right renal artery stent.  This will be repeated.  Carotid artery disease (HCC) History of carotid artery disease status post elective right carotid endarterectomy by Dr. Arbie Cookey in 2007 carotid Doppler studies performed 07/29/2021 revealing a widely patent and arterectomy site.  This will be repeated.  Essential hypertension History of essential hypertension a blood pressure measured today at 142/80.  He is on metoprolol and ramipril.  He was on hydralazine in the past which he stopped several weeks ago.  I have asked him to check his blood pressure at home and if it is lower than it is today he can stay off his hydralazine.  Peripheral arterial disease (HCC) History of PAD status post right common iliac artery PTA and stent by myself 03/28/2021 with orbital atherectomy.  I used a VBX covered stent (8 mm by 29 mm).  This resulted in complete normalization of his Dopplers and resolution of his claudication.  He is back playing tennis  without limitation.  His most recent lower extremity arterial Doppler studies performed 11/25/2021 revealed this to be widely patent.  We will follow-up aortoiliac Doppler studies.     Runell Gess MD FACP,FACC,FAHA, [facility] 01/04/2023 3:26 PM

## 2023-01-04 NOTE — Assessment & Plan Note (Signed)
History of right renal artery stent with recent renal Doppler studies performed 07/29/2021 revealing a widely patent right renal artery stent.  This will be repeated.

## 2023-01-04 NOTE — Assessment & Plan Note (Addendum)
Hyperlipidemia on Vytorin followed by his PCP.  His most recent lipid profile performed 08/28/2022 revealed total cholesterol 137, LDL of 66 and HDL 53.

## 2023-01-04 NOTE — Patient Instructions (Addendum)
Medication Instructions:   Your physician recommends that you continue on your current medications as directed. Please refer to the Current Medication list given to you today.  *If you need a refill on your cardiac medications before your next appointment, please call your pharmacy*  Lab Work: NONE ordered at this time of appointment   If you have labs (blood work) drawn today and your tests are completely normal, you will receive your results only by: MyChart Message (if you have MyChart) OR A paper copy in the mail If you have any lab test that is abnormal or we need to change your treatment, we will call you to review the results.  Testing/Procedures: Your physician has requested that you have a carotid duplex. This test is an ultrasound of the carotid arteries in your neck. It looks at blood flow through these arteries that supply the brain with blood. Allow one hour for this exam. There are no restrictions or special instructions.   Your physician has requested that you have a renal artery duplex. During this test, an ultrasound is used to evaluate blood flow to the kidneys. Allow one hour for this exam. Do not eat after midnight the day before and avoid carbonated beverages. Take your medications as you usually do.  Your physician has requested that you have an abdominal aorta duplex. During this test, an ultrasound is used to evaluate the aorta. Allow 30 minutes for this exam. Do not eat after midnight the day before and avoid carbonated beverages  Follow-Up: At [facility], you and your health needs are our priority.  As part of our continuing mission to provide you with exceptional heart care, we have created designated Provider Care Teams.  These Care Teams include your primary Cardiologist (physician) and Advanced Practice Providers (APPs -  Physician Assistants and Nurse Practitioners) who all work together to provide you with the care you need, when you need it.  Your  next appointment:   1 year(s)  Provider:   Nanetta Batty, MD     Other Instructions

## 2023-01-15 ENCOUNTER — Telehealth: Payer: Self-pay | Admitting: Cardiovascular Disease

## 2023-01-15 ENCOUNTER — Other Ambulatory Visit: Payer: Self-pay | Admitting: Cardiovascular Disease

## 2023-01-15 NOTE — Telephone Encounter (Signed)
Pt c/o medication issue:  1. Name of Medication:   sildenafil (VIAGRA) 100 MG tablet    2. How are you currently taking this medication (dosage and times per day)? As written   3. Are you having a reaction (difficulty breathing--STAT)? no  4. What is your medication issue? Pt states he needs prior auth filled out for this rx

## 2023-01-17 ENCOUNTER — Other Ambulatory Visit (HOSPITAL_COMMUNITY): Payer: Self-pay

## 2023-01-17 ENCOUNTER — Telehealth: Payer: Self-pay

## 2023-01-17 NOTE — Telephone Encounter (Addendum)
Pharmacy Patient Advocate Encounter  Prior Authorization for Viagra has been approved by [facility]).    Key [id number]  Effective dates: 4.8.24 through 5.8.25

## 2023-01-17 NOTE — Telephone Encounter (Signed)
Pharmacy Patient Advocate Encounter   Received notification from [facility] that prior authorization for VIAGRA is needed.    PA submitted on 01/17/23 Key [id number] Status is pending  Haze Rushing, CPhT Pharmacy Patient Advocate Specialist Direct Number: [phone] Fax: [phone]

## 2023-01-25 ENCOUNTER — Ambulatory Visit (HOSPITAL_BASED_OUTPATIENT_CLINIC_OR_DEPARTMENT_OTHER)
Admission: RE | Admit: 2023-01-25 | Discharge: 2023-01-25 | Disposition: A | Discharge status: Home / Self Care | Payer: 59 | Source: Ambulatory Visit | Attending: Cardiovascular Disease | Admitting: Cardiovascular Disease

## 2023-01-25 ENCOUNTER — Ambulatory Visit (HOSPITAL_COMMUNITY)
Admission: RE | Admit: 2023-01-25 | Discharge: 2023-01-25 | Disposition: A | Discharge status: Home / Self Care | Payer: 59 | Source: Ambulatory Visit | Attending: Internal Medicine | Admitting: Internal Medicine

## 2023-01-25 DIAGNOSIS — I701 Atherosclerosis of renal artery: Secondary | ICD-10-CM

## 2023-01-25 DIAGNOSIS — I739 Peripheral vascular disease, unspecified: Secondary | ICD-10-CM | POA: Insufficient documentation

## 2023-01-25 DIAGNOSIS — I2510 Atherosclerotic heart disease of native coronary artery without angina pectoris: Secondary | ICD-10-CM | POA: Diagnosis not present

## 2023-01-26 LAB — VAS US ABI WITH/WO TBI
Left ABI: 1.13
Right ABI: 1.11

## 2023-04-13 ENCOUNTER — Ambulatory Visit: Payer: 59 | Admitting: Cardiovascular Disease

## 2023-05-23 ENCOUNTER — Other Ambulatory Visit: Payer: Self-pay | Admitting: Cardiovascular Disease

## 2023-08-30 ENCOUNTER — Other Ambulatory Visit: Payer: Self-pay | Admitting: Cardiovascular Disease

## 2023-08-30 NOTE — Telephone Encounter (Signed)
Pt's pharmacy is requesting a refill on sildenafil. Would [name] like to refill this medication? Please address

## 2023-08-31 ENCOUNTER — Telehealth: Payer: Self-pay | Admitting: Cardiovascular Disease

## 2023-08-31 NOTE — Telephone Encounter (Signed)
Patient identification verified by 2 forms. Marilynn Rail, RN    Called and spoke to patient  Patient states:   -needs assistance with refills for Hydralazine and Viagra   -takes Hydralazine daily, has been taking for a long time   -has been taking Hydralazine, did not stop Rx recently finished a 90day refill   -has not been checking BP   -plans to start checking BP daily and start keeping a log  -plans to send message of BP log  -has follow up with PCP Monday  Informed patient message sent to [name]

## 2023-08-31 NOTE — Telephone Encounter (Signed)
Patient is calling to ask about his medication sildenafil (VIAGRA) 100 MG tablet and hydrALAZINE (APRESOLINE) 25 MG tablet

## 2023-09-03 LAB — LAB REPORT - SCANNED
A1c: 5.7
Creatinine, POC: 114 mg/dL
EGFR: 72
Microalb Creat Ratio: <6.2
Microalbumin, Urine: <0.7

## 2023-09-03 MED ORDER — HYDRALAZINE HCL 25 MG PO TABS: 25.0000 mg | ORAL_TABLET | Freq: Two times a day (BID) | ORAL | 2 refills | Status: DC

## 2023-09-03 NOTE — Telephone Encounter (Signed)
Patient identification verified by 2 forms. Marilynn Rail, RN    Called and spoke to patient  Advised patient to keep BP log for 1 week and send via mychart  Patient verbalized understanding, no questions at this time

## 2023-09-03 NOTE — Telephone Encounter (Signed)
Patient identification verified by 2 forms. Marilynn Rail, RN    Called and spoke to patient  Informed patient:   -refill for hydralazine 25mg  BID sent to pharmacy   -keep BP log for 2-3 weeks and send to mychart   -check BP first thing in the morning and 1 hour after taking medication  Patient states he has annual follow up with PCP today  Patient scheduled for annual follow up with [name] 01/08/24 Patient verbalized understanding and agrees with plan

## 2023-09-03 NOTE — Telephone Encounter (Signed)
Runell Gess, MD  You1 hour ago (1:17 PM)    See perhaps blood pressure log for the next week and send that into [place] to review

## 2023-09-21 ENCOUNTER — Other Ambulatory Visit: Payer: Self-pay | Admitting: Cardiovascular Disease

## 2023-10-24 ENCOUNTER — Encounter (HOSPITAL_COMMUNITY): Payer: Self-pay

## 2023-10-24 ENCOUNTER — Emergency Department (HOSPITAL_COMMUNITY): Payer: 59

## 2023-10-24 ENCOUNTER — Inpatient Hospital Stay (HOSPITAL_COMMUNITY)
Admission: EM | Admit: 2023-10-24 | Discharge: 2023-10-27 | DRG: 087 | Disposition: A | Discharge status: Home / Self Care | Payer: 59 | Attending: Surgery | Admitting: Surgery

## 2023-10-24 DIAGNOSIS — E785 Hyperlipidemia, unspecified: Secondary | ICD-10-CM | POA: Diagnosis present

## 2023-10-24 DIAGNOSIS — I252 Old myocardial infarction: Secondary | ICD-10-CM

## 2023-10-24 DIAGNOSIS — S066X1A Traumatic subarachnoid hemorrhage with loss of consciousness of 30 minutes or less, initial encounter: Secondary | ICD-10-CM | POA: Diagnosis present

## 2023-10-24 DIAGNOSIS — Y92312 Tennis court as the place of occurrence of the external cause: Secondary | ICD-10-CM

## 2023-10-24 DIAGNOSIS — Z951 Presence of aortocoronary bypass graft: Secondary | ICD-10-CM

## 2023-10-24 DIAGNOSIS — Z823 Family history of stroke: Secondary | ICD-10-CM

## 2023-10-24 DIAGNOSIS — Z87891 Personal history of nicotine dependence: Secondary | ICD-10-CM

## 2023-10-24 DIAGNOSIS — Z888 Allergy status to other drugs, medicaments and biological substances status: Secondary | ICD-10-CM | POA: Diagnosis not present

## 2023-10-24 DIAGNOSIS — W1830XA Fall on same level, unspecified, initial encounter: Secondary | ICD-10-CM | POA: Diagnosis present

## 2023-10-24 DIAGNOSIS — Y9373 Activity, racquet and hand sports: Secondary | ICD-10-CM | POA: Diagnosis not present

## 2023-10-24 DIAGNOSIS — Z91041 Radiographic dye allergy status: Secondary | ICD-10-CM

## 2023-10-24 DIAGNOSIS — R402362 Coma scale, best motor response, obeys commands, at arrival to emergency department: Secondary | ICD-10-CM | POA: Diagnosis present

## 2023-10-24 DIAGNOSIS — S065X9A Traumatic subdural hemorrhage with loss of consciousness of unspecified duration, initial encounter: Secondary | ICD-10-CM | POA: Diagnosis present

## 2023-10-24 DIAGNOSIS — G4700 Insomnia, unspecified: Secondary | ICD-10-CM | POA: Diagnosis present

## 2023-10-24 DIAGNOSIS — S02119A Unspecified fracture of occiput, initial encounter for closed fracture: Secondary | ICD-10-CM | POA: Diagnosis present

## 2023-10-24 DIAGNOSIS — I739 Peripheral vascular disease, unspecified: Secondary | ICD-10-CM | POA: Diagnosis present

## 2023-10-24 DIAGNOSIS — Z833 Family history of diabetes mellitus: Secondary | ICD-10-CM | POA: Diagnosis not present

## 2023-10-24 DIAGNOSIS — Z8249 Family history of ischemic heart disease and other diseases of the circulatory system: Secondary | ICD-10-CM | POA: Diagnosis not present

## 2023-10-24 DIAGNOSIS — S066XAA Traumatic subarachnoid hemorrhage with loss of consciousness status unknown, initial encounter: Principal | ICD-10-CM | POA: Diagnosis present

## 2023-10-24 DIAGNOSIS — Z7982 Long term (current) use of aspirin: Secondary | ICD-10-CM | POA: Diagnosis not present

## 2023-10-24 DIAGNOSIS — R402242 Coma scale, best verbal response, confused conversation, at arrival to emergency department: Secondary | ICD-10-CM | POA: Diagnosis present

## 2023-10-24 DIAGNOSIS — Z8349 Family history of other endocrine, nutritional and metabolic diseases: Secondary | ICD-10-CM

## 2023-10-24 DIAGNOSIS — Z885 Allergy status to narcotic agent status: Secondary | ICD-10-CM

## 2023-10-24 DIAGNOSIS — I701 Atherosclerosis of renal artery: Secondary | ICD-10-CM | POA: Diagnosis present

## 2023-10-24 DIAGNOSIS — R402142 Coma scale, eyes open, spontaneous, at arrival to emergency department: Secondary | ICD-10-CM | POA: Diagnosis present

## 2023-10-24 DIAGNOSIS — Z9861 Coronary angioplasty status: Secondary | ICD-10-CM

## 2023-10-24 DIAGNOSIS — S066X9A Traumatic subarachnoid hemorrhage with loss of consciousness of unspecified duration, initial encounter: Principal | ICD-10-CM | POA: Diagnosis present

## 2023-10-24 DIAGNOSIS — I10 Essential (primary) hypertension: Secondary | ICD-10-CM | POA: Diagnosis present

## 2023-10-24 DIAGNOSIS — I2510 Atherosclerotic heart disease of native coronary artery without angina pectoris: Secondary | ICD-10-CM | POA: Diagnosis present

## 2023-10-24 DIAGNOSIS — E739 Lactose intolerance, unspecified: Secondary | ICD-10-CM | POA: Diagnosis present

## 2023-10-24 DIAGNOSIS — T1490XA Injury, unspecified, initial encounter: Secondary | ICD-10-CM | POA: Diagnosis present

## 2023-10-24 LAB — SAMPLE TO BLOOD BANK

## 2023-10-24 LAB — CBC
HCT: 43.8 % (ref 39.0–52.0)
Hemoglobin: 15 g/dL (ref 13.0–17.0)
MCH: 32.4 pg (ref 26.0–34.0)
MCHC: 34.2 g/dL (ref 30.0–36.0)
MCV: 94.6 fL (ref 80.0–100.0)
Platelets: 171 10*3/uL (ref 150–400)
RBC: 4.63 MIL/uL (ref 4.22–5.81)
RDW: 12.5 % (ref 11.5–15.5)
WBC: 8.8 10*3/uL (ref 4.0–10.5)
nRBC: 0 % (ref 0.0–0.2)

## 2023-10-24 LAB — PROTIME-INR
INR: 0.9 (ref 0.8–1.2)
Prothrombin Time: 12.2 s (ref 11.4–15.2)

## 2023-10-24 LAB — I-STAT CHEM 8, ED
BUN: 16 mg/dL (ref 8–23)
Calcium, Ion: 0.94 mmol/L — ABNORMAL LOW (ref 1.15–1.40)
Chloride: 104 mmol/L (ref 98–111)
Creatinine, Ser: 1.2 mg/dL (ref 0.61–1.24)
Glucose, Bld: 143 mg/dL — ABNORMAL HIGH (ref 70–99)
HCT: 45 % (ref 39.0–52.0)
Hemoglobin: 15.3 g/dL (ref 13.0–17.0)
Potassium: 5.4 mmol/L — ABNORMAL HIGH (ref 3.5–5.1)
Sodium: 137 mmol/L (ref 135–145)
TCO2: 23 mmol/L (ref 22–32)

## 2023-10-24 LAB — I-STAT CG4 LACTIC ACID, ED: Lactic Acid, Venous: 4.3 mmol/L (ref 0.5–1.9)

## 2023-10-24 LAB — CBG MONITORING, ED: Glucose-Capillary: 126 mg/dL — ABNORMAL HIGH (ref 70–99)

## 2023-10-24 LAB — ETHANOL: Alcohol, Ethyl (B): <10 mg/dL (ref <10)

## 2023-10-24 MED ORDER — METHOCARBAMOL 1000 MG/10ML IJ SOLN: 500.0000 mg | Freq: Three times a day (TID) | INTRAMUSCULAR | Status: DC

## 2023-10-24 MED ORDER — ONDANSETRON 4 MG PO TBDP: 4.0000 mg | ORAL_TABLET | Freq: Four times a day (QID) | ORAL | Status: DC | PRN

## 2023-10-24 MED ORDER — NICARDIPINE HCL IN NACL 20-0.86 MG/200ML-% IV SOLN
3.0000 mg/h | INTRAVENOUS | Status: DC
  Administered 2023-10-24: 5 mg/h via INTRAVENOUS
  Filled 2023-10-24: qty 200

## 2023-10-24 MED ORDER — EZETIMIBE 10 MG PO TABS
10.0000 mg | ORAL_TABLET | Freq: Every day | ORAL | Status: DC
  Administered 2023-10-25 – 2023-10-27 (×3): 10 mg via ORAL
  Filled 2023-10-24 (×3): qty 1

## 2023-10-24 MED ORDER — POLYETHYLENE GLYCOL 3350 17 G PO PACK: 17.0000 g | PACK | Freq: Every day | ORAL | Status: DC | PRN

## 2023-10-24 MED ORDER — PROCHLORPERAZINE EDISYLATE 10 MG/2ML IJ SOLN
5.0000 mg | Freq: Once | INTRAMUSCULAR | Status: AC
  Administered 2023-10-24: 5 mg via INTRAVENOUS
  Filled 2023-10-24: qty 2

## 2023-10-24 MED ORDER — SIMVASTATIN 20 MG PO TABS
20.0000 mg | ORAL_TABLET | Freq: Every day | ORAL | Status: DC
Start: 2023-10-25 — End: 2023-10-27
  Administered 2023-10-25 – 2023-10-26 (×2): 20 mg via ORAL
  Filled 2023-10-24 (×3): qty 1

## 2023-10-24 MED ORDER — HYDRALAZINE HCL 25 MG PO TABS
25.0000 mg | ORAL_TABLET | Freq: Two times a day (BID) | ORAL | Status: DC
  Administered 2023-10-25 – 2023-10-27 (×5): 25 mg via ORAL
  Filled 2023-10-24 (×5): qty 1

## 2023-10-24 MED ORDER — ONDANSETRON HCL 4 MG/2ML IJ SOLN
4.0000 mg | Freq: Four times a day (QID) | INTRAMUSCULAR | Status: DC | PRN
  Administered 2023-10-26: 4 mg via INTRAVENOUS
  Filled 2023-10-24: qty 2

## 2023-10-24 MED ORDER — ACETAMINOPHEN 500 MG PO TABS
1000.0000 mg | ORAL_TABLET | Freq: Four times a day (QID) | ORAL | Status: DC
  Administered 2023-10-25 (×4): 1000 mg via ORAL
  Filled 2023-10-24 (×5): qty 2

## 2023-10-24 MED ORDER — METOPROLOL SUCCINATE ER 50 MG PO TB24
50.0000 mg | ORAL_TABLET | Freq: Every day | ORAL | Status: DC
  Administered 2023-10-25 – 2023-10-27 (×3): 50 mg via ORAL
  Filled 2023-10-24 (×3): qty 1

## 2023-10-24 MED ORDER — OXYCODONE HCL 5 MG PO TABS
5.0000 mg | ORAL_TABLET | ORAL | Status: DC | PRN
  Administered 2023-10-25 (×2): 5 mg via ORAL
  Filled 2023-10-24 (×3): qty 1

## 2023-10-24 MED ORDER — METHOCARBAMOL 500 MG PO TABS: 500.0000 mg | ORAL_TABLET | Freq: Three times a day (TID) | ORAL | Status: DC

## 2023-10-24 MED ORDER — CHLORHEXIDINE GLUCONATE CLOTH 2 % EX PADS
6.0000 | MEDICATED_PAD | Freq: Every day | CUTANEOUS | Status: DC
Start: 2023-10-25 — End: 2023-10-26
  Administered 2023-10-25: 6 via TOPICAL

## 2023-10-24 MED ORDER — DOCUSATE SODIUM 100 MG PO CAPS
100.0000 mg | ORAL_CAPSULE | Freq: Two times a day (BID) | ORAL | Status: DC
  Administered 2023-10-25 – 2023-10-27 (×4): 100 mg via ORAL
  Filled 2023-10-24 (×6): qty 1

## 2023-10-24 MED ORDER — HYDROMORPHONE HCL 1 MG/ML IJ SOLN: 0.5000 mg | INTRAMUSCULAR | Status: DC | PRN

## 2023-10-24 MED ORDER — LEVETIRACETAM IN NACL 500 MG/100ML IV SOLN
500.0000 mg | Freq: Two times a day (BID) | INTRAVENOUS | Status: DC
  Administered 2023-10-25 (×2): 500 mg via INTRAVENOUS
  Filled 2023-10-24 (×2): qty 100

## 2023-10-24 MED ORDER — ONDANSETRON HCL 4 MG/2ML IJ SOLN
4.0000 mg | Freq: Once | INTRAMUSCULAR | Status: AC
  Administered 2023-10-24: 4 mg via INTRAVENOUS
  Filled 2023-10-24: qty 2

## 2023-10-24 MED ORDER — KETOROLAC TROMETHAMINE 15 MG/ML IJ SOLN
15.0000 mg | Freq: Three times a day (TID) | INTRAMUSCULAR | Status: AC
  Administered 2023-10-25 (×2): 15 mg via INTRAVENOUS
  Filled 2023-10-24 (×2): qty 1

## 2023-10-24 MED ORDER — EZETIMIBE-SIMVASTATIN 10-20 MG PO TABS: 1.0000 | ORAL_TABLET | Freq: Every day | ORAL | Status: DC

## 2023-10-24 MED ORDER — DIPHENHYDRAMINE HCL 50 MG/ML IJ SOLN
25.0000 mg | Freq: Once | INTRAMUSCULAR | Status: AC
  Administered 2023-10-24: 25 mg via INTRAVENOUS
  Filled 2023-10-24: qty 1

## 2023-10-24 NOTE — ED Notes (Signed)
Patient transported to CT

## 2023-10-24 NOTE — ED Notes (Signed)
Pt bib [facility] after pt had a fall at tennis court and hit his head. Pt reportedly LOC for 2 minutes. Pt in C-collar placed by EMS. PERRLA. EMS reports patient is having repetitive questions. Pt does not remember anything that happened. Near syncope with any type of movement. Pt not on thinners.   EMS Vitals: 146/84 92 22 rr 98%  106 cbg 4mg  Zofran given

## 2023-10-24 NOTE — ED Provider Notes (Signed)
 [facility] Provider Note   CSN: [id number] Arrival date & time: 10/24/23  2014     History Chief Complaint  Patient presents with   Trauma    HPI [facility] Park is a 68 y.o. male presenting for GLF. Allegedly fell while playing tennis per EMS. He endorses a headache but is otherwise very confused. States he has a headache over and over.  Patient's recorded medical, surgical, social, medication list and allergies were reviewed in the Snapshot window as part of the initial history.   Review of Systems   Review of Systems  Unable to perform ROS: Mental status change    Physical Exam Updated Vital Signs BP (!) 172/78   Pulse 78   Temp (!) 97.5 F (36.4 C) (Oral)   Resp 18   SpO2 100%  Physical Exam Neurological:     GCS: GCS eye subscore is 4. GCS verbal subscore is 4. GCS motor subscore is 6.   Physical Exam  Neurologic: GCS 15, motor intact in all four extremities, sensory intact in all 4 extremities  Head: Pupils are 3mm, equally round and reactive to light, patient has no obvious facial trauma, no hemotympanum  Neck: patient has no midline neck tenderness, no obvious injuries.  Thorax: Patient has stable clavicles, stable thorax with bilateral chest rise and breath sounds heard.  No penetrating thoracic injury.  CV/Pulm: RRR, no audible murmer/rubs/gallops, CTAB  Abdomen: Patient has no abdominal distention, no penetrating abdominal injury.  Back: Patient has no midline spinal tenderness in the thoracic and lumbar spine, patient has no paraspinal tenderness bilaterally.  Pelvis: Patient has a stable pelvis to compression with palpable femoral pulses.  Extremities:Patient's upper extremities with no obvious injury or abnormality, radial pulses present. Patient's lower extremities with no obvious injury or abnormality, tibial pulses present.     ED Course/ Medical Decision Making/ A&P    Procedures .Critical  Care  Performed by: Glyn Ade, MD Authorized by: Glyn Ade, MD   Critical care provider statement:    Critical care time (minutes):  90   Critical care was necessary to treat or prevent imminent or life-threatening deterioration of the following conditions:  Trauma and CNS failure or compromise   Critical care was time spent personally by me on the following activities:  Development of treatment plan with patient or surrogate, discussions with consultants, evaluation of patient's response to treatment, examination of patient, ordering and review of laboratory studies, ordering and review of radiographic studies, ordering and performing treatments and interventions, pulse oximetry, re-evaluation of patient's condition and review of old charts   Care discussed with: admitting provider      Medications Ordered in ED Medications  nicardipine (CARDENE) 20mg  in 0.86% saline IV infusion (0.1 mg/ml) (5 mg/hr Intravenous New Bag/Given 10/24/23 2228)  acetaminophen (TYLENOL) tablet 1,000 mg (has no administration in time range)  docusate sodium (COLACE) capsule 100 mg (has no administration in time range)  polyethylene glycol (MIRALAX / GLYCOLAX) packet 17 g (has no administration in time range)  ondansetron (ZOFRAN-ODT) disintegrating tablet 4 mg (has no administration in time range)    Or  ondansetron (ZOFRAN) injection 4 mg (has no administration in time range)  oxyCODONE (Oxy IR/ROXICODONE) immediate release tablet 5 mg (has no administration in time range)  HYDROmorphone (DILAUDID) injection 0.5 mg (has no administration in time range)  ketorolac (TORADOL) 15 MG/ML injection 15 mg (has no administration in time range)  levETIRAcetam (KEPPRA) IVPB 500  mg/100 mL premix (has no administration in time range)  hydrALAZINE (APRESOLINE) tablet 25 mg (has no administration in time range)  metoprolol succinate (TOPROL-XL) 24 hr tablet 50 mg (has no administration in time range)   ezetimibe (ZETIA) tablet 10 mg (has no administration in time range)  simvastatin (ZOCOR) tablet 20 mg (has no administration in time range)  ondansetron (ZOFRAN) injection 4 mg (4 mg Intravenous Given 10/24/23 2219)  prochlorperazine (COMPAZINE) injection 5 mg (5 mg Intravenous Given 10/24/23 2222)  diphenhydrAMINE (BENADRYL) injection 25 mg (25 mg Intravenous Given 10/24/23 2217)   Medical Decision Making:    [facility] Park is a 68 y.o. male who presented to the ED today with a high mechanisma trauma, detailed above.    By institutional and departmental policy this was activated as a level 2 trauma. Patient placed on continuous vitals and telemetry monitoring while in ED which was reviewed periodically.   Given this mechanism of trauma, a full physical exam was performed.  Reviewed and confirmed nursing documentation for past medical history, family history, social history.    Initial Assessment/Plan:   I was called emergently to patient's bedside for a primary survey.  Primary survey: Airway intact.  BL breath sounds present.   Circulation established with WNL BP, 2 large bore IVs, and radial/femoral pulses.   Disability evaluation negative. No obvious disability requiring intervention.   Patient fully exposed and all injuries were noted, any penetrating injuries were labeled with radiopaque markers.  No emergent interventions took place in the primary survey.    Patient stable for CXR that demonstrated no traumatic hemopneumothorax and PXR that demonstrated no unstable pelvic fractures.  EFAST deferred.   Secondary survey: Once patient was stabilized, I personally performed a secondary survey to evaluate for any other injuries.  Results of this evaluation documented in the physical exam section. This is a patient presenting with a high mechanism trauma.  As such, I have considered intracranial injuries including intracranial hemorrhage, intrathoracic injuries including blunt  myocardial or blunt lung injury, blunt abdominal injuries including aortic dissection, bladder injury, spleen injury, liver injury and I have considered orthopedic injuries including extremity or spinal injury.   This was all evaluated by the below imaging as well as concurrently ordered laboratory evaluation which was reviewed.  Radiology: All radiology results were reviewed independently and agree with reads per radiology provider. CT HEAD WO CONTRAST Result Date: 10/24/2023 CLINICAL DATA:  Head trauma, moderate-severe; Polytrauma, blunt EXAM: CT HEAD WITHOUT CONTRAST CT CERVICAL SPINE WITHOUT CONTRAST TECHNIQUE: Multidetector CT imaging of the head and cervical spine was performed following the standard protocol without intravenous contrast. Multiplanar CT image reconstructions of the cervical spine were also generated. RADIATION DOSE REDUCTION: This exam was performed according to the departmental dose-optimization program which includes automated exposure control, adjustment of the mA and/or kV according to patient size and/or use of iterative reconstruction technique. COMPARISON:  None Available. FINDINGS: CT HEAD FINDINGS Brain: Acute 4 mm thick subarachnoid and subdural hemorrhage along the bifrontal convexities and tracking along the anterior falx. Vascular: Calcific atherosclerosis. Skull: Acute nondisplaced fracture of the right occipital bone extending inferiorly to the occiput. Sinuses/Orbits: Mild paranasal sinus mucosal thickening. No acute orbital findings. CT CERVICAL SPINE FINDINGS Alignment: No substantial sagittal subluxation. Skull base and vertebrae: No evidence of acute fracture. Soft tissues and spinal canal: No prevertebral fluid or swelling. No visible canal hematoma. Disc levels:  Moderate lower cervical degenerative change. Upper chest: Visualized lung apices are clear. IMPRESSION: 1. Acute  4 mm thick subarachnoid and subdural hemorrhage along the bifrontal convexities and tracking  along the anterior falx. No significant mass effect. 2. Acute nondisplaced fracture of the right occipital bone extending inferiorly to the occiput. 3. No evidence of acute fracture or traumatic malalignment in the cervical spine. Findings discussed with Dr. Doran Durand via telephone at 9:12 p.m. Electronically Signed   By: Feliberto Harts M.D.   On: 10/24/2023 21:14   CT CERVICAL SPINE WO CONTRAST Result Date: 10/24/2023 CLINICAL DATA:  Head trauma, moderate-severe; Polytrauma, blunt EXAM: CT HEAD WITHOUT CONTRAST CT CERVICAL SPINE WITHOUT CONTRAST TECHNIQUE: Multidetector CT imaging of the head and cervical spine was performed following the standard protocol without intravenous contrast. Multiplanar CT image reconstructions of the cervical spine were also generated. RADIATION DOSE REDUCTION: This exam was performed according to the departmental dose-optimization program which includes automated exposure control, adjustment of the mA and/or kV according to patient size and/or use of iterative reconstruction technique. COMPARISON:  None Available. FINDINGS: CT HEAD FINDINGS Brain: Acute 4 mm thick subarachnoid and subdural hemorrhage along the bifrontal convexities and tracking along the anterior falx. Vascular: Calcific atherosclerosis. Skull: Acute nondisplaced fracture of the right occipital bone extending inferiorly to the occiput. Sinuses/Orbits: Mild paranasal sinus mucosal thickening. No acute orbital findings. CT CERVICAL SPINE FINDINGS Alignment: No substantial sagittal subluxation. Skull base and vertebrae: No evidence of acute fracture. Soft tissues and spinal canal: No prevertebral fluid or swelling. No visible canal hematoma. Disc levels:  Moderate lower cervical degenerative change. Upper chest: Visualized lung apices are clear. IMPRESSION: 1. Acute 4 mm thick subarachnoid and subdural hemorrhage along the bifrontal convexities and tracking along the anterior falx. No significant mass effect. 2.  Acute nondisplaced fracture of the right occipital bone extending inferiorly to the occiput. 3. No evidence of acute fracture or traumatic malalignment in the cervical spine. Findings discussed with Dr. Doran Durand via telephone at 9:12 p.m. Electronically Signed   By: Feliberto Harts M.D.   On: 10/24/2023 21:14   DG Pelvis Portable Result Date: 10/24/2023 CLINICAL DATA:  Larey Seat, hit head EXAM: PORTABLE PELVIS 1-2 VIEWS COMPARISON:  None Available. FINDINGS: Supine frontal view of the pelvis includes both hips. No fracture, subluxation, or dislocation. Mild symmetrical bilateral hip osteoarthritis. Sacroiliac joints are unremarkable. IMPRESSION: 1. No acute displaced fracture. Electronically Signed   By: Sharlet Salina M.D.   On: 10/24/2023 21:02   DG Chest Port 1 View Result Date: 10/24/2023 CLINICAL DATA:  Trauma, hit head EXAM: PORTABLE CHEST 1 VIEW COMPARISON:  10/02/2011 FINDINGS: Single frontal view of the chest demonstrates postsurgical changes from CABG. Cardiac silhouette is enlarged. Lung volumes are diminished, with no airspace disease, effusion, or pneumothorax. No acute bony abnormalities. IMPRESSION: 1. No acute intrathoracic process. Electronically Signed   By: Sharlet Salina M.D.   On: 10/24/2023 21:02    Final Reassessment and Plan:   Patient's history of present on his physical exam findings really consistent with acute intracranial hemorrhage secondary to ground-level fall. Consulted neurosurgery who recommended CT scan at 5 AM and blood pressure goal systolic less than 160. This was treated with nicardipine infusion. Consulted trauma due to mechanism of injury and they agreed with admission.  Disposition:   Based on the above findings, I believe this patient is stable for admission.    Patient/family educated about specific findings on our evaluation and explained exact reasons for admission.  Patient/family educated about clinical situation and time was allowed to answer  questions.   Admission team communicated  with and agreed with need for admission. Patient admitted. Patient  ready to move at this time.     Emergency Department Medication Summary:   Medications  nicardipine (CARDENE) 20mg  in 0.86% saline IV infusion (0.1 mg/ml) (5 mg/hr Intravenous New Bag/Given 10/24/23 2228)  acetaminophen (TYLENOL) tablet 1,000 mg (has no administration in time range)  docusate sodium (COLACE) capsule 100 mg (has no administration in time range)  polyethylene glycol (MIRALAX / GLYCOLAX) packet 17 g (has no administration in time range)  ondansetron (ZOFRAN-ODT) disintegrating tablet 4 mg (has no administration in time range)    Or  ondansetron (ZOFRAN) injection 4 mg (has no administration in time range)  oxyCODONE (Oxy IR/ROXICODONE) immediate release tablet 5 mg (has no administration in time range)  HYDROmorphone (DILAUDID) injection 0.5 mg (has no administration in time range)  ketorolac (TORADOL) 15 MG/ML injection 15 mg (has no administration in time range)  levETIRAcetam (KEPPRA) IVPB 500 mg/100 mL premix (has no administration in time range)  hydrALAZINE (APRESOLINE) tablet 25 mg (has no administration in time range)  metoprolol succinate (TOPROL-XL) 24 hr tablet 50 mg (has no administration in time range)  ezetimibe (ZETIA) tablet 10 mg (has no administration in time range)  simvastatin (ZOCOR) tablet 20 mg (has no administration in time range)  ondansetron (ZOFRAN) injection 4 mg (4 mg Intravenous Given 10/24/23 2219)  prochlorperazine (COMPAZINE) injection 5 mg (5 mg Intravenous Given 10/24/23 2222)  diphenhydrAMINE (BENADRYL) injection 25 mg (25 mg Intravenous Given 10/24/23 2217)              Clinical Impression:  1. Trauma      Admit   Final Clinical Impression(s) / ED Diagnoses Final diagnoses:  Trauma    Rx / DC Orders ED Discharge Orders     None         Glyn Ade, MD 10/24/23 2313

## 2023-10-24 NOTE — ED Notes (Signed)
Multiple attempts at IV access unsuccessful, IV team consult order placed.

## 2023-10-24 NOTE — H&P (Addendum)
Maurice Park 04-10-1956  [id number].    Requesting MD: Dr. Glyn Ade Chief Complaint/Reason for Consult: fall, trauma  HPI:  Maurice Park is a 68 yo male who presented to the ED as a level 2 trauma after a ground-level fall. He was playing tennis and fell backward and hit the back of his head. He does not remember what happened immediately before the fall, and per report was unconscious for about 2 minutes. He has been stable since arrival. A CT scan scan showed a 4mm SAH/SDH and a right occipital bone fracture. Trauma was consulted for admission. The patient has been hypertensive and was started on a cardene gtt. He currently endorses a headache. Denies chest pain and abdominal pain. He states that when he swallows he feels like there is fluid in the right ear.  PMH includes PAD, CAD (s/p CABG in 2011), HLD, HTN, and OSA. He takes a baby aspirin daily but no other blood thinners.  ROS: Review of Systems  Constitutional:  Negative for chills and fever.  Eyes:  Negative for pain.  Respiratory:  Negative for shortness of breath and stridor.   Cardiovascular:  Negative for chest pain.  Gastrointestinal:  Negative for abdominal pain.  Neurological:  Positive for dizziness and loss of consciousness.    Family History  Problem Relation Age of Onset   Hypertension Mother 74   Diabetes Mother    Hyperlipidemia Mother    Coronary artery disease Father 22   Heart attack Father 77   Hypertension Father    Heart disease Father    Sudden death Father    Heart disease Paternal Grandmother 7   Stroke Paternal Grandmother 18   Heart attack Paternal Grandfather 63   Hyperlipidemia Brother     Past Medical History:  Diagnosis Date   Angina    Anxiety    Back pain    CAD (coronary artery disease), with CABG Lima to LAD, Lt. radial artery to 1st diagnal, sequential SVG to PDA and PLA, 12/20/09 April 2011   Carotid artery disease (HCC)    status post right carotid endarterectomy   Chest  pain    Dysrhythmia    History of heart attack    Hyperlipidemia, controlled    Hypertension    Joint pain    Lactose intolerance    Palpitations    Peripheral vascular disease (HCC)    Renal artery stenosis (HCC)    Right hip pain    Situational mixed anxiety and depressive disorder,     Sleep apnea    SOB (shortness of breath)     Past Surgical History:  Procedure Laterality Date   ABDOMINAL AORTOGRAM W/LOWER EXTREMITY N/A 03/28/2021   Procedure: ABDOMINAL AORTOGRAM W/LOWER EXTREMITY;  Surgeon: Runell Gess, MD;  [facility];  Service: Cardiovascular;  Laterality: N/A;  limited runoff   ANTERIOR CRUCIATE LIGAMENT REPAIR Left    CARDIAC CATHETERIZATION  12/14/2009   multivessel CAD >> CABG (Dr. Nicki Guadalajara)   CARDIAC CATHETERIZATION  10/03/2011   L Cfx w/80% ostial stenosis, ramus with 50% segmental mid stenosis; RCA dominant & occluded in midportion; LIMA to LAD patent; SVG to PDA/PLA patent; free L radial to diagonal functionally occluded (Dr. Erlene Quan)   CAROTID DOPPLER  11/2011   R CEA w/normal patency; left bulb w/0-49% diameter reduction   CAROTID ENDARTERECTOMY  05/03/2006   Dr. Tawanna Cooler Early   CORONARY ANGIOPLASTY  02/21/2000   PTCA with 3.25x54mm Quantum Monorail balloon of  in-stent posterolateral branch restenosis, reduced from 60% to under 20% (Dr. Erlene Quan)   CORONARY ARTERY BYPASS GRAFT  12/20/2009   LIMA-LAD, left radial-1st diagonal, SVG-PDA & PLA (Dr. Kathie Rhodes. Hendrickson)   HIP ARTHROSCOPY W/ LABRAL REPAIR     LEFT HEART CATHETERIZATION WITH CORONARY/GRAFT ANGIOGRAM N/A 10/03/2011   Procedure: LEFT HEART CATHETERIZATION WITH Isabel Caprice;  Surgeon: Runell Gess, MD;  Location: [facility] CATH LAB;  Service: Cardiovascular;  Laterality: N/A;   LOWER EXTREMITY ARTERIAL DOPPLER  11/2005   normal study    NM MYOCAR IMG MI  04/2011   bruce myoview - normal perfusion; EF 58%; low risk scan   PERIPHERAL VASCULAR INTERVENTION  03/28/2021    Procedure: PERIPHERAL VASCULAR INTERVENTION;  Surgeon: Runell Gess, MD;  [facility]: [facility];  Service: Cardiovascular;;  Right common iliac stent   RENAL DOPPLER  12/2012   right renal stent w/1-59% diameter reduction   TRANSTHORACIC ECHOCARDIOGRAM  04/2011   EF=>55%; trace MR; mild TR    Social History:  reports that he quit smoking about 41 years ago. His smoking use included cigarettes. He started smoking about 53 years ago. He has a 12 pack-year smoking history. He has never used smokeless tobacco. He reports current alcohol use of about 3.0 standard drinks of alcohol per week. He reports that he does not use drugs.  Allergies:  Allergies  Allergen Reactions   Hydrocortisone Hives   Other Swelling    Any topical bacitracin cream   Codeine Nausea Only   Contrast Media [Iodinated Contrast Media] Hives   Neosporin [Neomycin-Polymyxin-Gramicidin] Swelling    (Not in a hospital admission)    Physical Exam: Blood pressure (!) 172/78, pulse 78, temperature (!) 97.5 F (36.4 C), temperature source Oral, resp. rate 18, SpO2 100%. General: resting comfortably, appears stated age Neurological: alert and oriented, no focal deficits, pupils equal HEENT: normocephalic, atraumatic, no scleral icterus. No fluid or blood visible in the external ear bilaterally. CV: regular rate and rhythm, extremities warm and well-perfused Respiratory: normal work of breathing on room air, no chest wall deformities, symmetric chest wall expansion Abdomen: soft, nondistended, nontender to deep palpation. No masses or organomegaly. No abdominal wall ecchymoses. Extremities: warm and well-perfused, no deformities, moving all extremities spontaneously Psychiatric: normal mood and affect Skin: warm and dry, no jaundice, no rashes or lesions   Results for orders placed or performed during the hospital encounter of 10/24/23 (from the past 48 hours)  CBC     Status: None   Collection Time:  10/24/23  8:21 PM  Result Value Ref Range   WBC 8.8 4.0 - 10.5 K/uL   RBC 4.63 4.22 - 5.81 MIL/uL   Hemoglobin 15.0 13.0 - 17.0 g/dL   HCT 40.9 81.1 - 91.4 %   MCV 94.6 80.0 - 100.0 fL   MCH 32.4 26.0 - 34.0 pg   MCHC 34.2 30.0 - 36.0 g/dL   RDW 78.2 95.6 - 21.3 %   Platelets 171 150 - 400 K/uL   nRBC 0.0 0.0 - 0.2 %    Comment: Performed at [facility], [address] [place], [place] [address]  Ethanol     Status: None   Collection Time: 10/24/23  8:21 PM  Result Value Ref Range   Alcohol, Ethyl (B) <10 <10 mg/dL    Comment: (NOTE) Lowest detectable limit for serum alcohol is 10 mg/dL.  For medical purposes only. Performed at [facility], [address] [place],  [place] [address]   Protime-INR     Status: None   Collection Time: 10/24/23  8:21 PM  Result Value Ref Range   Prothrombin Time 12.2 11.4 - 15.2 seconds   INR 0.9 0.8 - 1.2    Comment: (NOTE) INR goal varies based on device and disease states. Performed at [facility], [address], [place], [place] [address]   Sample to Blood Bank     Status: None   Collection Time: 10/24/23  8:33 PM  Result Value Ref Range   Blood Bank Specimen SAMPLE AVAILABLE FOR TESTING    Sample Expiration      10/27/2023,2359 Performed at [facility], [address] [place], [place] [address]   CBG monitoring, ED     Status: Abnormal   Collection Time: 10/24/23  8:35 PM  Result Value Ref Range   Glucose-Capillary 126 (H) 70 - 99 mg/dL    Comment: Glucose reference range applies only to samples taken after fasting for at least 8 hours.  I-Stat Chem 8, ED     Status: Abnormal   Collection Time: 10/24/23  8:41 PM  Result Value Ref Range   Sodium 137 135 - 145 mmol/L   Potassium 5.4 (H) 3.5 - 5.1 mmol/L   Chloride 104 98 - 111 mmol/L   BUN 16 8 - 23 mg/dL   Creatinine, Ser 9.14 0.61 - 1.24 mg/dL   Glucose, Bld 782 (H) 70 - 99 mg/dL    Comment: Glucose reference range applies only to samples taken  after fasting for at least 8 hours.   Calcium, Ion 0.94 (L) 1.15 - 1.40 mmol/L   TCO2 23 22 - 32 mmol/L   Hemoglobin 15.3 13.0 - 17.0 g/dL   HCT 95.6 21.3 - 08.6 %  I-Stat Lactic Acid, ED     Status: Abnormal   Collection Time: 10/24/23  8:41 PM  Result Value Ref Range   Lactic Acid, Venous 4.3 (HH) 0.5 - 1.9 mmol/L   Comment NOTIFIED PHYSICIAN    CT HEAD WO CONTRAST Result Date: 10/24/2023 CLINICAL DATA:  Head trauma, moderate-severe; Polytrauma, blunt EXAM: CT HEAD WITHOUT CONTRAST CT CERVICAL SPINE WITHOUT CONTRAST TECHNIQUE: Multidetector CT imaging of the head and cervical spine was performed following the standard protocol without intravenous contrast. Multiplanar CT image reconstructions of the cervical spine were also generated. RADIATION DOSE REDUCTION: This exam was performed according to the departmental dose-optimization program which includes automated exposure control, adjustment of the mA and/or kV according to patient size and/or use of iterative reconstruction technique. COMPARISON:  None Available. FINDINGS: CT HEAD FINDINGS Brain: Acute 4 mm thick subarachnoid and subdural hemorrhage along the bifrontal convexities and tracking along the anterior falx. Vascular: Calcific atherosclerosis. Skull: Acute nondisplaced fracture of the right occipital bone extending inferiorly to the occiput. Sinuses/Orbits: Mild paranasal sinus mucosal thickening. No acute orbital findings. CT CERVICAL SPINE FINDINGS Alignment: No substantial sagittal subluxation. Skull base and vertebrae: No evidence of acute fracture. Soft tissues and spinal canal: No prevertebral fluid or swelling. No visible canal hematoma. Disc levels:  Moderate lower cervical degenerative change. Upper chest: Visualized lung apices are clear. IMPRESSION: 1. Acute 4 mm thick subarachnoid and subdural hemorrhage along the bifrontal convexities and tracking along the anterior falx. No significant mass effect. 2. Acute nondisplaced  fracture of the right occipital bone extending inferiorly to the occiput. 3. No evidence of acute fracture or traumatic malalignment in the cervical spine. Findings discussed with Dr. Doran Durand via telephone at  9:12 p.m. Electronically Signed   By: Feliberto Harts M.D.   On: 10/24/2023 21:14   CT CERVICAL SPINE WO CONTRAST Result Date: 10/24/2023 CLINICAL DATA:  Head trauma, moderate-severe; Polytrauma, blunt EXAM: CT HEAD WITHOUT CONTRAST CT CERVICAL SPINE WITHOUT CONTRAST TECHNIQUE: Multidetector CT imaging of the head and cervical spine was performed following the standard protocol without intravenous contrast. Multiplanar CT image reconstructions of the cervical spine were also generated. RADIATION DOSE REDUCTION: This exam was performed according to the departmental dose-optimization program which includes automated exposure control, adjustment of the mA and/or kV according to patient size and/or use of iterative reconstruction technique. COMPARISON:  None Available. FINDINGS: CT HEAD FINDINGS Brain: Acute 4 mm thick subarachnoid and subdural hemorrhage along the bifrontal convexities and tracking along the anterior falx. Vascular: Calcific atherosclerosis. Skull: Acute nondisplaced fracture of the right occipital bone extending inferiorly to the occiput. Sinuses/Orbits: Mild paranasal sinus mucosal thickening. No acute orbital findings. CT CERVICAL SPINE FINDINGS Alignment: No substantial sagittal subluxation. Skull base and vertebrae: No evidence of acute fracture. Soft tissues and spinal canal: No prevertebral fluid or swelling. No visible canal hematoma. Disc levels:  Moderate lower cervical degenerative change. Upper chest: Visualized lung apices are clear. IMPRESSION: 1. Acute 4 mm thick subarachnoid and subdural hemorrhage along the bifrontal convexities and tracking along the anterior falx. No significant mass effect. 2. Acute nondisplaced fracture of the right occipital bone extending inferiorly  to the occiput. 3. No evidence of acute fracture or traumatic malalignment in the cervical spine. Findings discussed with Dr. Doran Durand via telephone at 9:12 p.m. Electronically Signed   By: Feliberto Harts M.D.   On: 10/24/2023 21:14   DG Pelvis Portable Result Date: 10/24/2023 CLINICAL DATA:  Larey Seat, hit head EXAM: PORTABLE PELVIS 1-2 VIEWS COMPARISON:  None Available. FINDINGS: Supine frontal view of the pelvis includes both hips. No fracture, subluxation, or dislocation. Mild symmetrical bilateral hip osteoarthritis. Sacroiliac joints are unremarkable. IMPRESSION: 1. No acute displaced fracture. Electronically Signed   By: Sharlet Salina M.D.   On: 10/24/2023 21:02   DG Chest Port 1 View Result Date: 10/24/2023 CLINICAL DATA:  Trauma, hit head EXAM: PORTABLE CHEST 1 VIEW COMPARISON:  10/02/2011 FINDINGS: Single frontal view of the chest demonstrates postsurgical changes from CABG. Cardiac silhouette is enlarged. Lung volumes are diminished, with no airspace disease, effusion, or pneumothorax. No acute bony abnormalities. IMPRESSION: 1. No acute intrathoracic process. Electronically Signed   By: Sharlet Salina M.D.   On: 10/24/2023 21:02      Assessment/Plan 68 yo male s/p ground-level fall. R SAH/SDH R occipital bone fracture  - Neurosurgery consulted. Recommend repeat head CT in am, goal SBP<160. - HTN: Continue cardene gtt, will also resume home antihypertensives. - Multimodal pain control - q2h neuro checks - Keppra - No blood or fluid visible in right ear, but did not have working otoscope. Will need to do an ear exam in am. Monitor for CSF leak given patient's sensation of fluid in right ear in setting of right occipital fracture. - FEN: Clear liquid diet - VTE: SCDs, no chemical DVT ppx - Admit to ICU  Level of medical decision-making: High  Sophronia Simas, MD [facility] Surgery General, Hepatobiliary and Pancreatic Surgery 10/24/23 10:57 PM

## 2023-10-24 NOTE — Progress Notes (Signed)
Scan reviewed. Agree with report. Follow up head ct in am. Formal consult to follow.

## 2023-10-24 NOTE — ED Notes (Addendum)
Trauma Response Nurse Documentation   Maurice Park is a 68 y.o. male arriving to [facility] ED via EMS  On No antithrombotic. Trauma was activated as a Level 2 by ED charge RN based on the following trauma criteria GCS 10-14 associated with trauma or AVPU < A.  Patient cleared for CT by Dr. Doran Durand EDP. Pt transported to CT with trauma response nurse present to monitor. RN remained with the patient throughout their absence from the department for clinical observation.   GCS 14.  Trauma MD Arrival Time:   History   Past Medical History:  Diagnosis Date   Angina    Anxiety    Back pain    CAD (coronary artery disease), with CABG Lima to LAD, Lt. radial artery to 1st diagnal, sequential SVG to PDA and PLA, 12/20/09 April 2011   Carotid artery disease [facility])    status post right carotid endarterectomy   Chest pain    Dysrhythmia    History of heart attack    Hyperlipidemia, controlled    Hypertension    Joint pain    Lactose intolerance    Palpitations    Peripheral vascular disease (HCC)    Renal artery stenosis (HCC)    Right hip pain    Situational mixed anxiety and depressive disorder,     Sleep apnea    SOB (shortness of breath)      Past Surgical History:  Procedure Laterality Date   ABDOMINAL AORTOGRAM W/LOWER EXTREMITY N/A 03/28/2021   Procedure: ABDOMINAL AORTOGRAM W/LOWER EXTREMITY;  Surgeon: Runell Gess, MD;  [facility];  Service: Cardiovascular;  Laterality: N/A;  limited runoff   ANTERIOR CRUCIATE LIGAMENT REPAIR Left    CARDIAC CATHETERIZATION  12/14/2009   multivessel CAD >> CABG (Dr. Nicki Guadalajara)   CARDIAC CATHETERIZATION  10/03/2011   L Cfx w/80% ostial stenosis, ramus with 50% segmental mid stenosis; RCA dominant & occluded in midportion; LIMA to LAD patent; SVG to PDA/PLA patent; free L radial to diagonal functionally occluded (Dr. Erlene Quan)   CAROTID DOPPLER  11/2011   R CEA w/normal patency; left bulb w/0-49% diameter reduction    CAROTID ENDARTERECTOMY Right 05/03/2006   Dr. Tawanna Cooler Early   CORONARY ANGIOPLASTY  02/21/2000   PTCA with 3.25x50mm Quantum Monorail balloon of in-stent posterolateral branch restenosis, reduced from 60% to under 20% (Dr. Erlene Quan)   CORONARY ARTERY BYPASS GRAFT  12/20/2009   LIMA-LAD, left radial-1st diagonal, SVG-PDA & PLA (Dr. Kathie Rhodes. Hendrickson)   HIP ARTHROSCOPY W/ LABRAL REPAIR     LEFT HEART CATHETERIZATION WITH CORONARY/GRAFT ANGIOGRAM N/A 10/03/2011   Procedure: LEFT HEART CATHETERIZATION WITH Isabel Caprice;  Surgeon: Runell Gess, MD;  Location: [facility] CATH LAB;  Service: Cardiovascular;  Laterality: N/A;   LOWER EXTREMITY ARTERIAL DOPPLER  11/2005   normal study    NM MYOCAR IMG MI  04/2011   bruce myoview - normal perfusion; EF 58%; low risk scan   PERIPHERAL VASCULAR INTERVENTION  03/28/2021   Procedure: PERIPHERAL VASCULAR INTERVENTION;  Surgeon: Runell Gess, MD;  [facility];  Service: Cardiovascular;;  Right common iliac stent   RENAL DOPPLER  12/2012   right renal stent w/1-59% diameter reduction   TRANSTHORACIC ECHOCARDIOGRAM  04/2011   EF=>55%; trace MR; mild TR       Initial Focused Assessment (If applicable, or please see trauma documentation): Pt arrives via EMS from tennis court after a fall backwards with head injury, +LOC with possible  posturing for approx 2 minutes per EMS. Repetitive questioning with no memory of events.   Airway patent/unobstructed, BS clear No obvious uncontrolled hemorrhage GCS 14 PERRLA 3  CT's Completed:   CT Head and CT C-Spine   Interventions:  IV start, trauma lab draw Portable chest and pelvis XRAY CT head and c-spine [place] J collar CAGEAID  Plan for disposition:  Admit  Consults completed:  Neurosurgeon Meyran paged at 2118, call returned at 2130. Trauma Allen paged at 2150, call returned at 2207  Event Summary: Presents via EMS from tennis court after falling backwards striking his head. +LOC  with possible posturing per EMS report. Repetitive questioning with confusion to events. Family at bedside. Scans with SAH/SDH, admit.  MTP Summary (If applicable):   Bedside handoff with ED RN mariel.    Urian Martenson O Lilias Lorensen  Trauma Response RN  Please call TRN at [phone] for further assistance.

## 2023-10-24 NOTE — Progress Notes (Signed)
Orthopedic Tech Progress Note Patient Details:  Maurice Park November 22, 1955 [id number]  Patient ID: Maurice Park, male   DOB: September 01, 1956, 68 y.o.   MRN: [id number] I attended trauma page. Trinna Post 10/24/2023, 11:12 PM

## 2023-10-24 NOTE — ED Triage Notes (Signed)
Pt bib [facility] after pt had a fall at tennis court and hit his head. Pt reportedly LOC for 2 minutes. Pt in C-collar placed by EMS. PERRLA. EMS reports patient is having repetitive questions. Pt does not remember anything that happened. Near syncope with any type of movement. Pt not on thinners.   EMS Vitals: 146/84 92 22 rr 98%  106 cbg 4mg  Zofran given

## 2023-10-24 NOTE — Progress Notes (Signed)
Transition of Care [facility]) - CAGE-AID Screening   Patient Details  Name: Maurice Park MRN: [id number] Date of Birth: 13-Mar-1956  Transition of Care [facility]) CM/SW Contact:    Katha Hamming, RN Phone Number: 10/24/2023, 9:36 PM   Clinical Narrative:  Reports seldom alcohol use, no drug use, no resources indicated.  CAGE-AID Screening:    Have You Ever Felt You Ought to Cut Down on Your Drinking or Drug Use?: No Have People Annoyed You By Critizing Your Drinking Or Drug Use?: No Have You Felt Bad Or Guilty About Your Drinking Or Drug Use?: No Have You Ever Had a Drink or Used Drugs First Thing In The Morning to Steady Your Nerves or to Get Rid of a Hangover?: No CAGE-AID Score: 0  Substance Abuse Education Offered: No

## 2023-10-25 ENCOUNTER — Inpatient Hospital Stay (HOSPITAL_COMMUNITY): Payer: 59

## 2023-10-25 ENCOUNTER — Encounter (HOSPITAL_COMMUNITY): Payer: Self-pay | Admitting: Surgery

## 2023-10-25 LAB — COMPREHENSIVE METABOLIC PANEL
ALT: 30 U/L (ref 0–44)
AST: 30 U/L (ref 15–41)
Albumin: 4 g/dL (ref 3.5–5.0)
Alkaline Phosphatase: 53 U/L (ref 38–126)
Anion gap: 11 (ref 5–15)
BUN: 11 mg/dL (ref 8–23)
CO2: 24 mmol/L (ref 22–32)
Calcium: 8.7 mg/dL — ABNORMAL LOW (ref 8.9–10.3)
Chloride: 103 mmol/L (ref 98–111)
Creatinine, Ser: 1.19 mg/dL (ref 0.61–1.24)
GFR, Estimated: >60 mL/min (ref >60)
Glucose, Bld: 140 mg/dL — ABNORMAL HIGH (ref 70–99)
Potassium: 3.8 mmol/L (ref 3.5–5.1)
Sodium: 138 mmol/L (ref 135–145)
Total Bilirubin: 0.9 mg/dL (ref 0.0–1.2)
Total Protein: 6.8 g/dL (ref 6.5–8.1)

## 2023-10-25 LAB — BASIC METABOLIC PANEL
Anion gap: 14 (ref 5–15)
BUN: 9 mg/dL (ref 8–23)
CO2: 23 mmol/L (ref 22–32)
Calcium: 9 mg/dL (ref 8.9–10.3)
Chloride: 101 mmol/L (ref 98–111)
Creatinine, Ser: 1.11 mg/dL (ref 0.61–1.24)
GFR, Estimated: >60 mL/min (ref >60)
Glucose, Bld: 128 mg/dL — ABNORMAL HIGH (ref 70–99)
Potassium: 4.3 mmol/L (ref 3.5–5.1)
Sodium: 138 mmol/L (ref 135–145)

## 2023-10-25 LAB — CBC
HCT: 40.5 % (ref 39.0–52.0)
Hemoglobin: 13.7 g/dL (ref 13.0–17.0)
MCH: 31.9 pg (ref 26.0–34.0)
MCHC: 33.8 g/dL (ref 30.0–36.0)
MCV: 94.4 fL (ref 80.0–100.0)
Platelets: 167 10*3/uL (ref 150–400)
RBC: 4.29 MIL/uL (ref 4.22–5.81)
RDW: 12.7 % (ref 11.5–15.5)
WBC: 9.8 10*3/uL (ref 4.0–10.5)
nRBC: 0 % (ref 0.0–0.2)

## 2023-10-25 LAB — MRSA NEXT GEN BY PCR, NASAL: MRSA by PCR Next Gen: NOT DETECTED

## 2023-10-25 LAB — HIV ANTIBODY (ROUTINE TESTING W REFLEX): HIV Screen 4th Generation wRfx: NONREACTIVE

## 2023-10-25 MED ORDER — HYDROMORPHONE HCL 1 MG/ML IJ SOLN
0.5000 mg | INTRAMUSCULAR | Status: DC | PRN
  Administered 2023-10-26: 0.5 mg via INTRAVENOUS
  Filled 2023-10-25: qty 0.5

## 2023-10-25 MED ORDER — METOPROLOL TARTRATE 5 MG/5ML IV SOLN
5.0000 mg | Freq: Four times a day (QID) | INTRAVENOUS | Status: DC | PRN
  Administered 2023-10-27: 5 mg via INTRAVENOUS
  Filled 2023-10-25 (×2): qty 5

## 2023-10-25 MED ORDER — TRAMADOL HCL 50 MG PO TABS
50.0000 mg | ORAL_TABLET | ORAL | Status: DC | PRN
  Administered 2023-10-25: 50 mg via ORAL
  Filled 2023-10-25: qty 1

## 2023-10-25 MED ORDER — LEVETIRACETAM 500 MG PO TABS
500.0000 mg | ORAL_TABLET | Freq: Two times a day (BID) | ORAL | Status: DC
  Administered 2023-10-25 – 2023-10-27 (×4): 500 mg via ORAL
  Filled 2023-10-25 (×4): qty 1

## 2023-10-25 NOTE — Progress Notes (Signed)
PT Cancellation Note  Patient Details Name: Maurice Park MRN: [id number] DOB: [name] 07, 1956   Cancelled Treatment:    Reason Eval/Treat Not Completed: Other (comment) Awaiting c-spine flex-ex films to be read.  Lillia Pauls, PT, DPT Acute Rehabilitation Services Office [phone]    Norval Morton 10/25/2023, 11:02 AM

## 2023-10-25 NOTE — TOC Initial Note (Signed)
Transition of Care [facility]) - Initial/Assessment Note    Patient Details  Name: Maurice Park MRN: [id number] Date of Birth: 08-Feb-1956  Transition [facility]) CM/SW Contact:    Glennon Mac, RN Phone Number: 10/25/2023, 4:48 PM  Clinical Narrative:                  68 y/o male presents to [facility] on 2/12 after a ground level fall when playing tennis. Scans revealed a traumatic subarachnoid hemorrhage and an occipital fx .  Prior to admission, patient independent and living at home with spouse; family able to provide needed assistance at discharge.  PT recommending outpatient therapy follow-up; await OT evaluation.  Will follow for discharge needs as patient progresses.  Expected Discharge Plan: OP Rehab Barriers to Discharge: Continued Medical Work up              Expected Discharge Plan and Services   Discharge Planning Services: CM Consult   Living arrangements for the past 2 months: Single Family Home                                      Prior Living Arrangements/Services Living arrangements for the past 2 months: Single Family Home Lives with:: Spouse Patient language and need for interpreter reviewed:: Yes Do you feel safe going back to the place where you live?: Yes      Need for Family Participation in Patient Care: Yes (Comment) Care giver support system in place?: Yes (comment)   Criminal Activity/Legal Involvement Pertinent to Current Situation/Hospitalization: No - Comment as needed  Activities of Daily Living   ADL Screening (condition at time of admission) Is the patient deaf or have difficulty hearing?: No Does the patient have difficulty seeing, even when wearing glasses/contacts?: No Does the patient have difficulty concentrating, remembering, or making decisions?: No                 Emotional Assessment   Attitude/Demeanor/Rapport: Engaged Affect (typically observed): Accepting Orientation: : Oriented to Self, Oriented to Place,  Oriented to  Time, Oriented to Situation      Admission diagnosis:  Trauma [T14.90XA] Traumatic subarachnoid hemorrhage (HCC) [S06.6XAA] Patient Active Problem List   Diagnosis Date Noted   Traumatic subarachnoid hemorrhage (HCC) 10/24/2023   Peripheral arterial disease (HCC) 03/11/2021   Numbness and tingling of right leg 01/27/2021   Costochondritis 11/15/2020   Metabolic syndrome 11/11/2020   Prediabetes 10/18/2020   Class 1 obesity with serious comorbidity and body mass index (BMI) of 32.0 to 32.9 in adult 10/18/2020   Essential hypertension 07/02/2018   Renal artery stenosis (HCC) 07/24/2013   Carotid artery disease (HCC) 07/24/2013   Angina at rest [facility]) 10/02/2011   CAD (coronary artery disease), with CABG [place] to LAD, Lt. radial artery to 1st diagnal, sequential SVG to PDA and PLA, 12/20/09 10/02/2011   Hyperlipidemia, controlled 10/02/2011   Situational mixed anxiety and depressive disorder,  10/02/2011   PCP:  Lupita Raider, MD Pharmacy:   [facility] [phone] - [place], [place] - [address] AT [address] [facility] [address] [place] [address] Phone: [phone] Fax: [phone]     Social Drivers of Health (SDOH) Social History: SDOH Screenings   Depression (PHQ2-9): Medium Risk (09/16/2020)  Tobacco Use: Medium Risk (10/25/2023)   SDOH Interventions:     Readmission Risk Interventions     No data to display  Quintella Baton, RN, BSN  Trauma/Neuro ICU Case Manager [phone]

## 2023-10-25 NOTE — Consult Note (Signed)
Reason for Consult:tSAH Referring Physician: EDP  TEIGAN Park is an 68 y.o. male.   HPI:  68 year old gentleman admitted with a closed head injury and hypertension after a fall suffered while playing tennis.  T scan showed traumatic subarachnoid hemorrhage and an occipital fracture and neurosurgical evaluation was requested.  Patient complains of some headache though it is better than last night.  He has amnesia for the event.  No nausea and vomiting.  No numbness tingling or weakness.  Head CT has been repeated this morning.  He is in a cervical collar.  Initial cervical spine CT showed no fracture or malalignment.  Past Medical History:  Diagnosis Date   Angina    Anxiety    Back pain    CAD (coronary artery disease), with CABG [place] to LAD, Lt. radial artery to 1st diagnal, sequential SVG to PDA and PLA, 12/20/09 April 2011   Carotid artery disease [facility])    status post right carotid endarterectomy   Chest pain    Dysrhythmia    History of heart attack    Hyperlipidemia, controlled    Hypertension    Joint pain    Lactose intolerance    Palpitations    Peripheral vascular disease (HCC)    Renal artery stenosis (HCC)    Right hip pain    Situational mixed anxiety and depressive disorder,     Sleep apnea    SOB (shortness of breath)     Past Surgical History:  Procedure Laterality Date   ABDOMINAL AORTOGRAM W/LOWER EXTREMITY N/A 03/28/2021   Procedure: ABDOMINAL AORTOGRAM W/LOWER EXTREMITY;  Surgeon: Runell Gess, MD;  Location: [facility];  Service: Cardiovascular;  Laterality: N/A;  limited runoff   ANTERIOR CRUCIATE LIGAMENT REPAIR Left    CARDIAC CATHETERIZATION  12/14/2009   multivessel CAD >> CABG (Dr. Nicki Guadalajara)   CARDIAC CATHETERIZATION  10/03/2011   L Cfx w/80% ostial stenosis, ramus with 50% segmental mid stenosis; RCA dominant & occluded in midportion; LIMA to LAD patent; SVG to PDA/PLA patent; free L radial to diagonal functionally occluded (Dr. Erlene Quan)   CAROTID DOPPLER  11/2011   R CEA w/normal patency; left bulb w/0-49% diameter reduction   CAROTID ENDARTERECTOMY  05/03/2006   Dr. Tawanna Cooler Early   CORONARY ANGIOPLASTY  02/21/2000   PTCA with 3.25x82mm Quantum Monorail balloon of in-stent posterolateral branch restenosis, reduced from 60% to under 20% (Dr. Erlene Quan)   CORONARY ARTERY BYPASS GRAFT  12/20/2009   LIMA-LAD, left radial-1st diagonal, SVG-PDA & PLA (Dr. Kathie Rhodes. [place]ricks[place])   HIP ARTHROSCOPY W/ LABRAL REPAIR     LEFT HEART CATHETERIZATION WITH CORONARY/GRAFT ANGIOGRAM N/A 10/03/2011   Procedure: LEFT HEART CATHETERIZATION WITH Isabel Caprice;  Surgeon: Runell Gess, MD;  Location: [facility] CATH LAB;  Service: Cardiovascular;  Laterality: N/A;   LOWER EXTREMITY ARTERIAL DOPPLER  11/2005   normal study    NM MYOCAR IMG MI  04/2011   bruce myoview - normal perfusion; EF 58%; low risk scan   PERIPHERAL VASCULAR INTERVENTION  03/28/2021   Procedure: PERIPHERAL VASCULAR INTERVENTION;  Surgeon: Runell Gess, MD;  Location: [facility];  Service: Cardiovascular;;  Right common iliac stent   RENAL DOPPLER  12/2012   right renal stent w/1-59% diameter reduction   TRANSTHORACIC ECHOCARDIOGRAM  04/2011   EF=>55%; trace MR; mild TR    Allergies  Allergen Reactions   Hydrocortisone Hives   Other Swelling    Any topical bacitracin cream  Codeine Nausea Only   Contrast Media [Iodinated Contrast Media] Hives   Neosporin [Neomycin-Polymyxin-Gramicidin] Swelling    Social History   Tobacco Use   Smoking status: Former    Current packs/day: 0.00    Average packs/day: 1 pack/day for 12.0 years (12.0 ttl pk-yrs)    Types: Cigarettes    Start date: 11/09/1969    Quit date: 11/09/1981    Years since quitting: 41.9   Smokeless tobacco: Never  Substance Use Topics   Alcohol use: Yes    Alcohol/week: 3.0 standard drinks of alcohol    Types: 3 Cans of beer per week    Comment: last drink was in november    Family  History  Problem Relation Age of Onset   Hypertension Mother 8   Diabetes Mother    Hyperlipidemia Mother    Coronary artery disease Father 108   Heart attack Father 27   Hypertension Father    Heart disease Father    Sudden death Father    Heart disease Paternal Grandmother 26   Stroke Paternal Grandmother 103   Heart attack Paternal Grandfather 68   Hyperlipidemia Brother      Review of Systems  Positive ROS: As above  All other systems have been reviewed and were otherwise negative with the exception of those mentioned in the HPI and as above.  Objective: Vital signs in last 24 hours: Temp:  [97.5 F (36.4 C)-99.3 F (37.4 C)] 98.5 F (36.9 C) (02/13 0400) Pulse Rate:  [65-97] 65 (02/13 0700) Resp:  [12-19] 12 (02/13 0700) BP: (101-172)/(55-80) 111/69 (02/13 0700) SpO2:  [85 %-100 %] 93 % (02/13 0700) Weight:  [87.2 kg] 87.2 kg (02/13 0000)  General Appearance: Alert, cooperative, no distress, appears stated age Head: Normocephalic, without obvious abnormality, atraumatic Eyes: PERRL, conjunctiva/corneas clear, EOM's intact     Throat: benign Neck: In collar Lungs:  respirations unlabored Heart: Regular rate and rhythm   NEUROLOGIC:   Mental status: A&O x4, no aphasia, good attention span, Memory and fund of knowledge appear to be appropriate other than amnesia for the event Motor Exam - grossly normal, normal tone and bulk Sensory Exam - grossly normal Reflexes:  Coordination - grossly normal Gait -not tested Balance -not tested Cranial Nerves: I: smell Not tested  II: visual acuity  OS: na  OD: na  II: visual fields Full to confrontation  II: pupils Equal, round, reactive to light  III,VII: ptosis None  III,IV,VI: extraocular muscles  Full ROM  V: mastication Normal  V: facial light touch sensation  Normal  V,VII: corneal reflex  Present  VII: facial muscle function - upper  Normal  VII: facial muscle function - lower Normal  VIII: hearing Not  tested  IX: soft palate elevation  Normal  IX,X: gag reflex Present  XI: trapezius strength  5/5  XI: sternocleidomastoid strength 5/5  XI: neck flexion strength  5/5  XII: tongue strength  Normal    Data Review Lab Results  Component Value Date   WBC 9.8 10/25/2023   HGB 13.7 10/25/2023   HCT 40.5 10/25/2023   MCV 94.4 10/25/2023   PLT 167 10/25/2023   Lab Results  Component Value Date   NA 138 10/25/2023   K 4.3 10/25/2023   CL 101 10/25/2023   CO2 23 10/25/2023   BUN 9 10/25/2023   CREATININE 1.11 10/25/2023   GLUCOSE 128 (H) 10/25/2023   Lab Results  Component Value Date   INR 0.9 10/24/2023  Radiology: CT HEAD WO CONTRAST Result Date: 10/25/2023 CLINICAL DATA:  Follow-up intracranial hemorrhage EXAM: CT HEAD WITHOUT CONTRAST TECHNIQUE: Contiguous axial images were obtained from the base of the skull through the vertex without intravenous contrast. RADIATION DOSE REDUCTION: This exam was performed according to the departmental dose-optimization program which includes automated exposure control, adjustment of the mA and/or kV according to patient size and/or use of iterative reconstruction technique. COMPARISON:  Yesterday FINDINGS: Brain: Scattered subarachnoid hemorrhage along the cerebral convexities, left sylvian fissure, and inter peduncular cistern is diffusely and mildly increased. Some of this change is related to redistribution with left subarachnoid blood seen along the inferior frontal lobes. Suspect mild inferior frontal lobe hemorrhage and edema from contusion, hemorrhage on the left measuring up to 5 mm. The pattern is compatible with history of head trauma. No evidence of infarct, hydrocephalus, or mass Vascular: No hyperdense vessel or unexpected calcification. Skull: Vertical, nondisplaced right occipital bone fracture, known. Sinuses/Orbits: Generalized mucosal thickening in the ethmoid, sphenoid, and frontal sinuses. IMPRESSION: 1. Scattered subarachnoid  hemorrhage with mild increase/redistribution from yesterday. 2. Possible small inferior frontal lobe contusions. 3. Known occipital bone fracture that is nondepressed. Electronically Signed   By: Tiburcio Pea M.D.   On: 10/25/2023 05:26   CT HEAD WO CONTRAST Result Date: 10/24/2023 CLINICAL DATA:  Head trauma, moderate-severe; Polytrauma, blunt EXAM: CT HEAD WITHOUT CONTRAST CT CERVICAL SPINE WITHOUT CONTRAST TECHNIQUE: Multidetector CT imaging of the head and cervical spine was performed following the standard protocol without intravenous contrast. Multiplanar CT image reconstructions of the cervical spine were also generated. RADIATION DOSE REDUCTION: This exam was performed according to the departmental dose-optimization program which includes automated exposure control, adjustment of the mA and/or kV according to patient size and/or use of iterative reconstruction technique. COMPARISON:  None Available. FINDINGS: CT HEAD FINDINGS Brain: Acute 4 mm thick subarachnoid and subdural hemorrhage along the bifrontal convexities and tracking along the anterior falx. Vascular: Calcific atherosclerosis. Skull: Acute nondisplaced fracture of the right occipital bone extending inferiorly to the occiput. Sinuses/Orbits: Mild paranasal sinus mucosal thickening. No acute orbital findings. CT CERVICAL SPINE FINDINGS Alignment: No substantial sagittal subluxation. Skull base and vertebrae: No evidence of acute fracture. Soft tissues and spinal canal: No prevertebral fluid or swelling. No visible canal hematoma. Disc levels:  Moderate lower cervical degenerative change. Upper chest: Visualized lung apices are clear. IMPRESSION: 1. Acute 4 mm thick subarachnoid and subdural hemorrhage along the bifrontal convexities and tracking along the anterior falx. No significant mass effect. 2. Acute nondisplaced fracture of the right occipital bone extending inferiorly to the occiput. 3. No evidence of acute fracture or traumatic  malalignment in the cervical spine. Findings discussed with Dr. Doran Durand via telephone at 9:12 p.m. Electronically Signed   By: Feliberto Harts M.D.   On: 10/24/2023 21:14   CT CERVICAL SPINE WO CONTRAST Result Date: 10/24/2023 CLINICAL DATA:  Head trauma, moderate-severe; Polytrauma, blunt EXAM: CT HEAD WITHOUT CONTRAST CT CERVICAL SPINE WITHOUT CONTRAST TECHNIQUE: Multidetector CT imaging of the head and cervical spine was performed following the standard protocol without intravenous contrast. Multiplanar CT image reconstructions of the cervical spine were also generated. RADIATION DOSE REDUCTION: This exam was performed according to the departmental dose-optimization program which includes automated exposure control, adjustment of the mA and/or kV according to patient size and/or use of iterative reconstruction technique. COMPARISON:  None Available. FINDINGS: CT HEAD FINDINGS Brain: Acute 4 mm thick subarachnoid and subdural hemorrhage along the bifrontal convexities and tracking along the anterior  falx. Vascular: Calcific atherosclerosis. Skull: Acute nondisplaced fracture of the right occipital bone extending inferiorly to the occiput. Sinuses/Orbits: Mild paranasal sinus mucosal thickening. No acute orbital findings. CT CERVICAL SPINE FINDINGS Alignment: No substantial sagittal subluxation. Skull base and vertebrae: No evidence of acute fracture. Soft tissues and spinal canal: No prevertebral fluid or swelling. No visible canal hematoma. Disc levels:  Moderate lower cervical degenerative change. Upper chest: Visualized lung apices are clear. IMPRESSION: 1. Acute 4 mm thick subarachnoid and subdural hemorrhage along the bifrontal convexities and tracking along the anterior falx. No significant mass effect. 2. Acute nondisplaced fracture of the right occipital bone extending inferiorly to the occiput. 3. No evidence of acute fracture or traumatic malalignment in the cervical spine. Findings discussed  with Dr. Doran Durand via telephone at 9:12 p.m. Electronically Signed   By: Feliberto Harts M.D.   On: 10/24/2023 21:14   DG Pelvis Portable Result Date: 10/24/2023 CLINICAL DATA:  Larey Seat, hit head EXAM: PORTABLE PELVIS 1-2 VIEWS COMPARISON:  None Available. FINDINGS: Supine frontal view of the pelvis includes both hips. No fracture, subluxation, or dislocation. Mild symmetrical bilateral hip osteoarthritis. Sacroiliac joints are unremarkable. IMPRESSION: 1. No acute displaced fracture. Electronically Signed   By: Sharlet Salina M.D.   On: 10/24/2023 21:02   DG Chest Port 1 View Result Date: 10/24/2023 CLINICAL DATA:  Trauma, hit head EXAM: PORTABLE CHEST 1 VIEW COMPARISON:  10/02/2011 FINDINGS: Single frontal view of the chest demonstrates postsurgical changes from CABG. Cardiac silhouette is enlarged. Lung volumes are diminished, with no airspace disease, effusion, or pneumothorax. No acute bony abnormalities. IMPRESSION: 1. No acute intrathoracic process. Electronically Signed   By: Sharlet Salina M.D.   On: 10/24/2023 21:02     Assessment/Plan: Estimated body mass index is 33 kg/m as calculated from the following:   Height as of this encounter: 5\' 4"  (1.626 m).   Weight as of this encounter: 87.2 kg.   Head CT this morning shows some scattered tiny areas of traumatic subarachnoid blood and may be 1 left inferior frontal contusion.  No mass effect or shift.  Basal cisterns are open.  Would treat this like a concussion.  Need to finish clearing his neck with flexion-extension films with clinical exam.  Okay to discharge from the ICU from our standpoint.  Likely can be discharged home with family fairly expediently and follow-up in the office in a week or 2.  Obviously, no tennis or other sports until he recovers from his concussion.   Tia Alert 10/25/2023 7:40 AM

## 2023-10-25 NOTE — Progress Notes (Signed)
Trauma/Critical Care Follow Up Note  Subjective:    Overnight Issues:   Objective:  Vital signs for last 24 hours: Temp:  [97.5 F (36.4 C)-99.3 F (37.4 C)] 98 F (36.7 C) (02/13 0800) Pulse Rate:  [65-97] 65 (02/13 0700) Resp:  [12-19] 12 (02/13 0700) BP: (101-172)/(55-80) 111/69 (02/13 0700) SpO2:  [85 %-100 %] 93 % (02/13 0700) Weight:  [87.2 kg] 87.2 kg (02/13 0000)  Hemodynamic parameters for last 24 hours:    Intake/Output from previous day: 02/12 0701 - 02/13 0700 In: 165.6 [I.V.:65.6; IV Piggyback:100] Out: 500 [Urine:500]  Intake/Output this shift: No intake/output data recorded.  Vent settings for last 24 hours:    Physical Exam:  Gen: comfortable, no distress Neuro: follows commands, alert, communicative HEENT: PERRL Neck: c-collar in place CV: RRR Pulm: unlabored breathing on RA Abd: soft, NT   ,    GU: urine clear and yellow, +spontaneous voids Extr: wwp, no edema  Results for orders placed or performed during the hospital encounter of 10/24/23 (from the past 24 hours)  CBC     Status: None   Collection Time: 10/24/23  8:21 PM  Result Value Ref Range   WBC 8.8 4.0 - 10.5 K/uL   RBC 4.63 4.22 - 5.81 MIL/uL   Hemoglobin 15.0 13.0 - 17.0 g/dL   HCT 21.3 08.6 - 57.8 %   MCV 94.6 80.0 - 100.0 fL   MCH 32.4 26.0 - 34.0 pg   MCHC 34.2 30.0 - 36.0 g/dL   RDW 46.9 62.9 - 52.8 %   Platelets 171 150 - 400 K/uL   nRBC 0.0 0.0 - 0.2 %  Ethanol     Status: None   Collection Time: 10/24/23  8:21 PM  Result Value Ref Range   Alcohol, Ethyl (B) <10 <10 mg/dL  Protime-INR     Status: None   Collection Time: 10/24/23  8:21 PM  Result Value Ref Range   Prothrombin Time 12.2 11.4 - 15.2 seconds   INR 0.9 0.8 - 1.2  Sample to Blood Bank     Status: None   Collection Time: 10/24/23  8:33 PM  Result Value Ref Range   Blood Bank Specimen SAMPLE AVAILABLE FOR TESTING    Sample Expiration      10/27/2023,2359 Performed at [facility], [address], [place], [place] [address]   CBG monitoring, ED     Status: Abnormal   Collection Time: 10/24/23  8:35 PM  Result Value Ref Range   Glucose-Capillary 126 (H) 70 - 99 mg/dL  I-Stat Chem 8, ED     Status: Abnormal   Collection Time: 10/24/23  8:41 PM  Result Value Ref Range   Sodium 137 135 - 145 mmol/L   Potassium 5.4 (H) 3.5 - 5.1 mmol/L   Chloride 104 98 - 111 mmol/L   BUN 16 8 - 23 mg/dL   Creatinine, Ser 4.01 0.61 - 1.24 mg/dL   Glucose, Bld 027 (H) 70 - 99 mg/dL   Calcium, Ion 2.53 (L) 1.15 - 1.40 mmol/L   TCO2 23 22 - 32 mmol/L   Hemoglobin 15.3 13.0 - 17.0 g/dL   HCT 66.4 40.3 - 47.4 %  I-Stat Lactic Acid, ED     Status: Abnormal   Collection Time: 10/24/23  8:41 PM  Result Value Ref Range   Lactic Acid, Venous 4.3 (HH) 0.5 - 1.9 mmol/L   Comment NOTIFIED PHYSICIAN   Comprehensive metabolic panel     Status: Abnormal  Collection Time: 10/24/23 11:30 PM  Result Value Ref Range   Sodium 138 135 - 145 mmol/L   Potassium 3.8 3.5 - 5.1 mmol/L   Chloride 103 98 - 111 mmol/L   CO2 24 22 - 32 mmol/L   Glucose, Bld 140 (H) 70 - 99 mg/dL   BUN 11 8 - 23 mg/dL   Creatinine, Ser 9.14 0.61 - 1.24 mg/dL   Calcium 8.7 (L) 8.9 - 10.3 mg/dL   Total Protein 6.8 6.5 - 8.1 g/dL   Albumin 4.0 3.5 - 5.0 g/dL   AST 30 15 - 41 U/L   ALT 30 0 - 44 U/L   Alkaline Phosphatase 53 38 - 126 U/L   Total Bilirubin 0.9 0.0 - 1.2 mg/dL   GFR, Estimated >78 >29 mL/min   Anion gap 11 5 - 15  MRSA Next Gen by PCR, Nasal     Status: None   Collection Time: 10/25/23 12:01 AM   Specimen: Nasal Mucosa; Nasal Swab  Result Value Ref Range   MRSA by PCR Next Gen NOT DETECTED NOT DETECTED  CBC     Status: None   Collection Time: 10/25/23  4:51 AM  Result Value Ref Range   WBC 9.8 4.0 - 10.5 K/uL   RBC 4.29 4.22 - 5.81 MIL/uL   Hemoglobin 13.7 13.0 - 17.0 g/dL   HCT 56.2 13.0 - 86.5 %   MCV 94.4 80.0 - 100.0 fL   MCH 31.9 26.0 - 34.0 pg   MCHC 33.8 30.0 - 36.0 g/dL   RDW 78.4 69.6 - 29.5 %    Platelets 167 150 - 400 K/uL   nRBC 0.0 0.0 - 0.2 %  Basic metabolic panel     Status: Abnormal   Collection Time: 10/25/23  4:51 AM  Result Value Ref Range   Sodium 138 135 - 145 mmol/L   Potassium 4.3 3.5 - 5.1 mmol/L   Chloride 101 98 - 111 mmol/L   CO2 23 22 - 32 mmol/L   Glucose, Bld 128 (H) 70 - 99 mg/dL   BUN 9 8 - 23 mg/dL   Creatinine, Ser 2.84 0.61 - 1.24 mg/dL   Calcium 9.0 8.9 - 13.2 mg/dL   GFR, Estimated >44 >01 mL/min   Anion gap 14 5 - 15    Assessment & Plan:  Present on Admission:  Traumatic subarachnoid hemorrhage (HCC)    LOS: 1 day   Additional comments:I reviewed the patient's new clinical lab test results.   and I reviewed the patients new imaging test results.    76M s/p GLF  R SAH/SDH - stable repeat head CT, NSGY c/s, Dr. Yetta Barre, goal SBP<160, keppra  R occipital bone fracture - nonop, NSGY c/s, Dr. Yetta Barre, plan c-spine flex-ex films per NSGY recs, continue c-collar, otoscopic exam HTN - off cardene gtt, resume home antihypertensives FEN: adv to regular diet VTE: SCDs, start LMWH in AM Dispo - 4NP   Diamantina Monks, MD Trauma & General Surgery Please use [email or web address] to contact on call provider  10/25/2023  *Care during the described time interval was provided by me. I have reviewed this patient's available data, including medical history, events of note, physical examination and test results as part of my evaluation.

## 2023-10-25 NOTE — Progress Notes (Signed)
Patient arrived from ICU. Patient had complaints of pain, PRN tramadol given once, awaiting results. Patient and family updated in plan of care. Progressive status maintained.

## 2023-10-25 NOTE — Progress Notes (Signed)
Physical Therapy Evaluation Patient Details Name: Maurice Park MRN: [id number] DOB: 1955/10/01 Today's Date: 10/25/2023  History of Present Illness  68 y/o male presents to [facility] on 2/12 after a ground level fall when playing tennis. Scans revealed a traumatic subarachnoid hemorrhage and an occipital fx . PMH includes: PAD, CAD (s/p CABG in 2011), HLD, HTN, and OSA, R THA  Clinical Impression  Patient willing to participate with therapy. Session began with educating the patient about concussive symptoms and modifying his lifestyle. A concussion packet was provided to the patient and he acknowledges that he will be compliant. Patient required CGA to ensure safety with bed mobility, transfers (supine > sit and sit > stand), stairs, and gait. Patient reported dizziness when transitioning from supine > sitting and his vitals were assessed prior to ambulation: BP 119/65bpm (MAP 81); Pulse 66bpm; O2 95% on RA; RR 14bpm. BERG score of 51 indicating independence with walking and no requirement of a  AD. Patient ambulated 260ft within the unit  and ascended/descended two steps with CGA to ensure safety, however was limited by pain in the R hip (hx of R THA). Patient required several standing rest breaks in between and would benefit from a SPC to alleviate the pressure in his RLE. Patient would benefit from OPPT in order to address concussive symptoms and balance. No further acute PT needs at this time.       If plan is discharge home, recommend the following: A little help with walking and/or transfers;Assistance with cooking/housework;Assist for transportation;Help with stairs or ramp for entrance   Can travel by private vehicle        Equipment Recommendations Cane  Recommendations for Other Services       Functional Status Assessment Patient has had a recent decline in their functional status and demonstrates the ability to make significant improvements in function in a reasonable and predictable  amount of time.     Precautions / Restrictions Precautions Precautions: Fall;Other (comment) (Concussion) Recall of Precautions/Restrictions: Intact Precaution/Restrictions Comments: Concussion packet provided Restrictions Weight Bearing Restrictions Per Provider Order: No      Mobility  Bed Mobility Overal bed mobility: Needs Assistance Bed Mobility: Supine to Sit     Supine to sit: Contact guard     General bed mobility comments: CGA to ensure safety, reports dizziness when sitting up    Transfers Overall transfer level: Needs assistance Equipment used: None Transfers: Sit to/from Stand Sit to Stand: Contact guard assist           General transfer comment: CGA to ensure safety due to reports of dizziness when transitioning from supine > sit (vitals stable)    Ambulation/Gait Ambulation/Gait assistance: Contact guard assist Gait Distance (Feet): 200 Feet Assistive device: None Gait Pattern/deviations: Trendelenburg, Staggering left       General Gait Details: Reports of right hip pain, requried multiple rest breaks due to increased discomfort  Stairs Stairs: Yes Stairs assistance: Contact guard assist Stair Management: Alternating pattern, Step to pattern Number of Stairs: 2 General stair comments: Was able to practice stairs, reports increased discomfort in right hip when ascending/descending  Wheelchair Mobility     Tilt Bed    Modified Rankin (Stroke Patients Only)       Balance                                   Berg Balance Test Sit to Stand: Able to  stand  independently using hands Standing Unsupported: Able to stand safely 2 minutes Sitting with Back Unsupported but Feet Supported on Floor or Stool: Able to sit safely and securely 2 minutes Stand to Sit: Sits safely with minimal use of hands Transfers: Able to transfer safely, minor use of hands Standing Unsupported with Eyes Closed: Able to stand 10 seconds  safely Standing Ubsupported with Feet Together: Able to place feet together independently and stand 1 minute safely From Standing, Reach Forward with Outstretched Arm: Can reach forward >5 cm safely (2") From Standing Position, Pick up Object from Floor: Able to pick up shoe safely and easily From Standing Position, Turn to Look Behind Over each Shoulder: Looks behind from both sides and weight shifts well Turn 360 Degrees: Able to turn 360 degrees safely in 4 seconds or less Standing Unsupported, Alternately Place Feet on Step/Stool: Able to stand independently and safely and complete 8 steps in 20 seconds Standing Unsupported, One Foot in Front: Able to place foot tandem independently and hold 30 seconds Standing on One Leg: Able to lift leg independently and hold equal to or more than 3 seconds Total Score: 51         Pertinent Vitals/Pain Pain Assessment Pain Assessment: Faces Pain Location: R hip Pain Descriptors / Indicators: Grimacing Pain Intervention(s): Monitored during session, Premedicated before session, Limited activity within patient's tolerance, Heat applied    Home Living Family/patient expects to be discharged to:: Private residence Living Arrangements: Spouse/significant other Available Help at Discharge: Family Type of Home: House Home Access: Other (comment) (2 steps in house, no railings to support)       Home Layout: One level Home Equipment: Crutches;Shower seat - built in;Grab bars - tub/shower      Prior Function Prior Level of Function : Independent/Modified Independent             Mobility Comments: Pensions consultant.       Extremity/Trunk Assessment   Upper Extremity Assessment Upper Extremity Assessment: Overall WFL for tasks assessed    Lower Extremity Assessment Lower Extremity Assessment: RLE deficits/detail RLE Deficits / Details: Hx of R THA, pain when ambulating RLE Sensation: WNL RLE Coordination: decreased  gross motor    Cervical / Trunk Assessment Cervical / Trunk Assessment: Normal  Communication   Communication Communication: No apparent difficulties    Cognition Arousal: Alert Behavior During Therapy: WFL for tasks assessed/performed   PT - Cognitive impairments: No apparent impairments                         Following commands: Intact       Cueing Cueing Techniques: Verbal cues, Gestural cues, Tactile cues     General Comments General comments (skin integrity, edema, etc.): Vitals: BP 119/65 (map81); Pulse 66; o2 95%; 14 rr    Exercises     Assessment/Plan    PT Assessment All further PT needs can be met in the next venue of care  PT Problem List Decreased strength;Decreased range of motion;Decreased activity tolerance;Decreased balance;Decreased mobility;Pain       PT Treatment Interventions      PT Goals (Current goals can be found in the Care Plan section)  Acute Rehab PT Goals Patient Stated Goal: Return to PLOF and stay active PT Goal Formulation: With patient Time For Goal Achievement: 11/08/23 Potential to Achieve Goals: Good    Frequency       Co-evaluation  AM-PAC PT "6 Clicks" Mobility  Outcome Measure Help needed turning from your back to your side while in a flat bed without using bedrails?: None Help needed moving from lying on your back to sitting on the side of a flat bed without using bedrails?: None Help needed moving to and from a bed to a chair (including a wheelchair)?: A Little Help needed standing up from a chair using your arms (e.g., wheelchair or bedside chair)?: A Little Help needed to walk in hospital room?: A Little Help needed climbing 3-5 steps with a railing? : A Little 6 Click Score: 20    End of Session Equipment Utilized During Treatment: Gait belt Activity Tolerance: Patient limited by pain Patient left: in chair;with call bell/phone within reach;with chair alarm set;with nursing/sitter  in room Nurse Communication: Mobility status PT Visit Diagnosis: Other abnormalities of gait and mobility (R26.89);Muscle weakness (generalized) (M62.81);History of falling (Z91.81);Pain;Dizziness and giddiness (R42) Pain - Right/Left: Right Pain - part of body: Hip    Time: 1610-9604 PT Time Calculation (min) (ACUTE ONLY): 49 min   Charges:   PT Evaluation $PT Eval Low Complexity: 1 Low PT Treatments $Therapeutic Activity: 23-37 mins PT General Charges $$ ACUTE PT VISIT: 1 Visit         Doreen Beam, SPT   Maurice Park 10/25/2023, 4:08 PM

## 2023-10-25 NOTE — Plan of Care (Signed)

## 2023-10-26 MED ORDER — ACETAMINOPHEN 325 MG PO TABS: 650.0000 mg | ORAL_TABLET | Freq: Four times a day (QID) | ORAL | Status: DC | PRN

## 2023-10-26 MED ORDER — MAGNESIUM CITRATE PO SOLN
0.5000 | Freq: Once | ORAL | Status: DC
  Filled 2023-10-26: qty 296

## 2023-10-26 MED ORDER — QUETIAPINE FUMARATE 50 MG PO TABS
25.0000 mg | ORAL_TABLET | Freq: Every evening | ORAL | Status: DC | PRN
  Administered 2023-10-26: 25 mg via ORAL
  Filled 2023-10-26: qty 1

## 2023-10-26 MED ORDER — PROMETHAZINE HCL 6.25 MG/5ML PO SOLN: 12.5000 mg | Freq: Four times a day (QID) | ORAL | Status: DC | PRN

## 2023-10-26 MED ORDER — SODIUM CHLORIDE 0.9 % IV BOLUS
1000.0000 mL | Freq: Once | INTRAVENOUS | Status: AC
  Administered 2023-10-26: 1000 mL via INTRAVENOUS

## 2023-10-26 MED ORDER — MAGNESIUM HYDROXIDE 400 MG/5ML PO SUSP
30.0000 mL | Freq: Once | ORAL | Status: AC
  Administered 2023-10-26: 30 mL via ORAL
  Filled 2023-10-26: qty 30

## 2023-10-26 MED ORDER — BUTALBITAL-APAP-CAFFEINE 50-325-40 MG PO TABS
2.0000 | ORAL_TABLET | ORAL | Status: DC | PRN
  Administered 2023-10-26 – 2023-10-27 (×4): 2 via ORAL
  Filled 2023-10-26 (×5): qty 2

## 2023-10-26 MED ORDER — BISACODYL 5 MG PO TBEC
10.0000 mg | DELAYED_RELEASE_TABLET | Freq: Once | ORAL | Status: AC
  Administered 2023-10-26: 10 mg via ORAL
  Filled 2023-10-26 (×2): qty 2

## 2023-10-26 MED ORDER — PROMETHAZINE HCL 12.5 MG PO TABS
12.5000 mg | ORAL_TABLET | Freq: Four times a day (QID) | ORAL | Status: DC | PRN
  Administered 2023-10-26: 12.5 mg via ORAL
  Filled 2023-10-26 (×2): qty 2

## 2023-10-26 MED ORDER — BISACODYL 10 MG RE SUPP: 10.0000 mg | Freq: Once | RECTAL | Status: DC

## 2023-10-26 MED ORDER — SENNA 8.6 MG PO TABS
2.0000 | ORAL_TABLET | Freq: Once | ORAL | Status: DC
  Filled 2023-10-26: qty 2

## 2023-10-26 NOTE — Evaluation (Signed)
Speech Language Pathology Evaluation Patient Details Name: Maurice Park MRN: [id number] DOB: 1956/05/03 Today's Date: 10/26/2023 Time: 4098-1191 SLP Time Calculation (min) (ACUTE ONLY): 32 min  Problem List:  Patient Active Problem List   Diagnosis Date Noted   Traumatic subarachnoid hemorrhage (HCC) 10/24/2023   Peripheral arterial disease (HCC) 03/11/2021   Numbness and tingling of right leg 01/27/2021   Costochondritis 11/15/2020   Metabolic syndrome 11/11/2020   Prediabetes 10/18/2020   Class 1 obesity with serious comorbidity and body mass index (BMI) of 32.0 to 32.9 in adult 10/18/2020   Essential hypertension 07/02/2018   Renal artery stenosis (HCC) 07/24/2013   Carotid artery disease (HCC) 07/24/2013   Angina at rest [facility]) 10/02/2011   CAD (coronary artery disease), with CABG [place] to LAD, Lt. radial artery to 1st diagnal, sequential SVG to PDA and PLA, 12/20/09 10/02/2011   Hyperlipidemia, controlled 10/02/2011   Situational mixed anxiety and depressive disorder,  10/02/2011   Past Medical History:  Past Medical History:  Diagnosis Date   Angina    Anxiety    Back pain    CAD (coronary artery disease), with CABG [place] to LAD, Lt. radial artery to 1st diagnal, sequential SVG to PDA and PLA, 12/20/09 April 2011   Carotid artery disease (HCC)    status post right carotid endarterectomy   Chest pain    Dysrhythmia    History of heart attack    Hyperlipidemia, controlled    Hypertension    Joint pain    Lactose intolerance    Palpitations    Peripheral vascular disease (HCC)    Renal artery stenosis (HCC)    Right hip pain    Situational mixed anxiety and depressive disorder,     Sleep apnea    SOB (shortness of breath)    Past Surgical History:  Past Surgical History:  Procedure Laterality Date   ABDOMINAL AORTOGRAM W/LOWER EXTREMITY N/A 03/28/2021   Procedure: ABDOMINAL AORTOGRAM W/LOWER EXTREMITY;  Surgeon: Runell Gess, MD;  Location: [facility] INVASIVE CV LAB;   Service: Cardiovascular;  Laterality: N/A;  limited runoff   ANTERIOR CRUCIATE LIGAMENT REPAIR Left    CARDIAC CATHETERIZATION  12/14/2009   multivessel CAD >> CABG (Dr. Nicki Guadalajara)   CARDIAC CATHETERIZATION  10/03/2011   L Cfx w/80% ostial stenosis, ramus with 50% segmental mid stenosis; RCA dominant & occluded in midportion; LIMA to LAD patent; SVG to PDA/PLA patent; free L radial to diagonal functionally occluded (Dr. Erlene Quan)   CAROTID DOPPLER  11/2011   R CEA w/normal patency; left bulb w/0-49% diameter reduction   CAROTID ENDARTERECTOMY  05/03/2006   Dr. Tawanna Cooler Early   CORONARY ANGIOPLASTY  02/21/2000   PTCA with 3.25x62mm [employer] Mon[employer] balloon of in-stent posterolateral branch restenosis, reduced from 60% to under 20% (Dr. Erlene Quan)   CORONARY ARTERY BYPASS GRAFT  12/20/2009   LIMA-LAD, left radial-1st diagonal, SVG-PDA & PLA (Dr. Kathie Rhodes. Hendrickson)   HIP ARTHROSCOPY W/ LABRAL REPAIR     LEFT HEART CATHETERIZATION WITH CORONARY/GRAFT ANGIOGRAM N/A 10/03/2011   Procedure: LEFT HEART CATHETERIZATION WITH Isabel Caprice;  Surgeon: Runell Gess, MD;  Location: [facility] CATH LAB;  Service: Cardiovascular;  Laterality: N/A;   LOWER EXTREMITY ARTERIAL DOPPLER  11/2005   normal study    NM MYOCAR IMG MI  04/2011   bruce myoview - normal perfusion; EF 58%; low risk scan   PERIPHERAL VASCULAR INTERVENTION  03/28/2021   Procedure: PERIPHERAL VASCULAR INTERVENTION;  Surgeon: Runell Gess, MD;  Location: [facility]  INVASIVE CV LAB;  Service: Cardiovascular;;  Right common iliac stent   RENAL DOPPLER  12/2012   right renal stent w/1-59% diameter reduction   TRANSTHORACIC ECHOCARDIOGRAM  04/2011   EF=>55%; trace MR; mild TR   HPI:  68 y/o male presents to [facility] on 2/12 after a ground level fall when playing tennis (+LOC with possible posturing for 2 minutes). Scans revealed a traumatic subarachnoid hemorrhage and an occipital fx . PMH includes: PAD, CAD (s/p CABG in 2011), HLD, HTN, and OSA,  R THA   Assessment / Plan / Recommendation Clinical Impression  Pt has several symptoms after head injury including nausea and sensitivity to light. He describes the room as spinning when he tries to sit up. He is remembering events from the hospital fairly well but not some of the more specific details. He is also demonstrating some good awareness of current limitations as well as need for precautions before resuming normal activities (or not, as he verbalized safety concerns about playing tennis again after he heals). Pt did not feel like he could focus his gaze on visuospatial subtests of the SLUMS so this was deferred, but he scored 22 out of a possible 24 points, losing points only on delayed recall subtest. Despite testing well he subjectively reports feeling like the testing was more challenging than it normally would be. He was also only tested in a very quiet environment, and question if he may have more trouble in a distracting one. Given how symptomatic he has been, would recommand OP SLP for ongoing assessment. Pt and wife in agreement and education was provided about precautions upon return home.    SLP Assessment  SLP Recommendation/Assessment: Patient needs continued Speech Lanaguage Pathology Services SLP Visit Diagnosis: Cognitive communication deficit (R41.841)    Recommendations for follow up therapy are one component of a multi-disciplinary discharge planning process, led by the attending physician.  Recommendations may be updated based on patient status, additional functional criteria and insurance authorization.    Follow Up Recommendations  Outpatient SLP    Assistance Recommended at Discharge  Frequent or constant Supervision/Assistance (upon initial return home; can likley progress to intermittent)  Functional Status Assessment Patient has had a recent decline in their functional status and demonstrates the ability to make significant improvements in function in a reasonable  and predictable amount of time.  Frequency and Duration min 2x/week  2 weeks      SLP Evaluation Cognition  Overall Cognitive Status: Impaired/Different from baseline Arousal/Alertness: Awake/alert Orientation Level: Oriented X4 Attention: Sustained Sustained Attention: Appears intact Memory: Impaired Memory Impairment: Retrieval deficit Problem Solving: Impaired Problem Solving Impairment: Verbal complex Safety/Judgment: Appears intact       Comprehension  Auditory Comprehension Overall Auditory Comprehension: Appears within functional limits for tasks assessed    Expression Expression Primary Mode of Expression: Verbal Verbal Expression Overall Verbal Expression: Appears within functional limits for tasks assessed   Oral / Motor  Motor Speech Overall Motor Speech: Appears within functional limits for tasks assessed            Mahala Menghini., M.A. CCC-SLP Acute Rehabilitation Services Office [phone]  Secure chat preferred  10/26/2023, 1:42 PM

## 2023-10-26 NOTE — Evaluation (Signed)
Occupational Therapy Evaluation Patient Details Name: Maurice Park MRN: [id number] DOB: 1955/10/03 Today's Date: 10/26/2023   History of Present Illness   68 y/o male presents to [facility] on 2/12 after a ground level fall when playing tennis. Scans revealed a traumatic subarachnoid hemorrhage and an occipital fx . PMH includes: PAD, CAD (s/p CABG in 2011), HLD, HTN, and OSA, R THA     Clinical Impressions PT admitted with [facility] TBI s/p fall  . Pt currently with functional limitiations due to the deficits listed below (see OT problem list). Pt is indep with all adls and iadls working a full time job. Pt at this time limited by concussion symptoms that limit his daily routine. Pt was able to progress with increased time and cues for visual gaze stabilization. Pt encouraged to sit up for meals over the weekend.  Pt at this time does not show visual deficits but hard to fully assess as he needs dim to dark room to function due to light sensitivity.  Pt will benefit from skilled OT to increase their independence and safety with adls and balance to allow discharge outpatient OT .      If plan is discharge home, recommend the following:         Functional Status Assessment   Patient has had a recent decline in their functional status and demonstrates the ability to make significant improvements in function in a reasonable and predictable amount of time.     Equipment Recommendations   Other (comment) (RW)     Recommendations for Other Services         Precautions/Restrictions   Precautions Precautions: Fall;Other (comment) Recall of Precautions/Restrictions: Intact Precaution/Restrictions Comments: concussion recovery reviewed     Mobility Bed Mobility Overal bed mobility: Needs Assistance Bed Mobility: Supine to Sit     Supine to sit: Contact guard     General bed mobility comments: pt with hob increased gradually . pt with a sign hung on certain and using gaze  stablization with feet hanging off EOB and OT increased HOB to upright posture. pt static sitting up from bed surface and toleraing. pt able to progress with this sequence.    Transfers Overall transfer level: Needs assistance   Transfers: Sit to/from Stand Sit to Stand: Contact guard assist                  Balance Overall balance assessment: Mild deficits observed, not formally tested                                 [employer] Test Sit to Stand: Able to stand  independently using hands       ADL either performed or assessed with clinical judgement   ADL Overall ADL's : Needs assistance/impaired   Eating/Feeding Details (indicate cue type and reason): reports po intake due to nausea                 Lower Body Dressing: Minimal assistance Lower Body Dressing Details (indicate cue type and reason): pt able to figure 4 cross L LE only. pt with immediate dizziness and nausea attempting to look at his feet Toilet Transfer: Contact guard assist;Cueing for sequencing;Rolling walker (2 wheels)           Functional mobility during ADLs: Contact guard assist;Rolling walker (2 wheels) General ADL Comments: educated on gaze stabilization with excellent return demo     Vision  Baseline Vision/History: 0 No visual deficits Additional Comments: pt is not able to complete any visual asessment as he is sensitive to all lights. session completed with dim lighting. wife encouraged to bring in sung glasses. pt reports that light hurts and he just cant handle it. pt able to gaze stablize and read 14 font during session correctly     Perception         Praxis         Pertinent Vitals/Pain Pain Assessment Pain Assessment: Faces Faces Pain Scale: Hurts little more Pain Location: head Pain Descriptors / Indicators: Headache Pain Intervention(s): Monitored during session, Premedicated before session, Repositioned, Limited activity within patient's tolerance      Extremity/Trunk Assessment Upper Extremity Assessment Upper Extremity Assessment: Overall WFL for tasks assessed   Lower Extremity Assessment Lower Extremity Assessment: RLE deficits/detail RLE Deficits / Details: prior hip surgery and reports R LE not "trusting" pt utilized RW during session   Cervical / Trunk Assessment Cervical / Trunk Assessment: Normal   Communication Communication Communication: No apparent difficulties   Cognition Arousal: Lethargic Behavior During Therapy: Flat affect Cognition: No apparent impairments (concussion symptoms)             OT - Cognition Comments: pt reports symptoms consistent with concussion such as dizziness and HA with head tilt. pt with gaze stabilization utilized and useful demonstration                 Following commands: Intact       Cueing  General Comments          Exercises     Shoulder Instructions      Home Living Family/patient expects to be discharged to:: Private residence Living Arrangements: Spouse/significant other Available Help at Discharge: Family Type of Home: House Home Access: Other (comment) (2 steps in house, no railings to support)     Home Layout: One level     Bathroom Shower/Tub: Producer, television/film/video: Handicapped height Bathroom Accessibility: Yes   Home Equipment: Crutches;Shower seat - built in;Grab bars - tub/shower   Additional Comments: dog that is blind 23 yo  Lives With: Spouse    Prior Functioning/Environment Prior Level of Function : Independent/Modified Independent             Mobility Comments: Pensions consultant. 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    OT Problem List: Decreased strength;Decreased activity tolerance;Impaired balance (sitting and/or standing);Decreased safety awareness;Decreased knowledge of use  of DME or AE;Decreased knowledge of precautions;Pain   OT Treatment/Interventions: Self-care/ADL training;DME and/or AE instruction;Energy conservation;Therapeutic activities;Balance training;Patient/family education      OT Goals(Current goals can be found in the care plan section)   Acute Rehab OT Goals Patient Stated Goal: to be abel to return to work OT Goal Formulation: With patient Time For Goal Achievement: 11/09/23 Potential to Achieve Goals: Good   OT Frequency:  Min 1X/week    Co-evaluation              AM-PAC OT "6 Clicks" Daily Activity     Outcome Measure Help from another person eating meals?: A Little Help from another person taking care of personal grooming?: A Little Help from another person toileting, which includes using toliet, bedpan, or urinal?: A Little Help from another person bathing (including washing, rinsing, drying)?: A Lot Help from another person to  put on and taking off regular upper body clothing?: A Little Help from another person to put on and taking off regular lower body clothing?: A Lot 6 Click Score: 16   End of Session Equipment Utilized During Treatment: Gait belt;Rolling walker (2 wheels) Nurse Communication: Mobility status;Precautions  Activity Tolerance: Patient tolerated treatment well Patient left: in chair;with call bell/phone within reach;with family/visitor present  OT Visit Diagnosis: Unsteadiness on feet (R26.81);Muscle weakness (generalized) (M62.81)                Time: 4098-1191 OT Time Calculation (min): 37 min Charges:  OT General Charges $OT Visit: 1 Visit OT Evaluation $OT Eval Moderate Complexity: 1 Mod OT Treatments $Self Care/Home Management : 8-22 mins   Brynn, OTR/L  Acute Rehabilitation Services Office: [phone] .   Mateo Flow 10/26/2023, 3:48 PM

## 2023-10-26 NOTE — Progress Notes (Signed)
Awake alert, has headache and dizziness. Neuro intact. He's post concussive and may be so for a while. Safe for lovenox. OK for d/c home from our standpoint

## 2023-10-26 NOTE — Progress Notes (Signed)
   Trauma/Critical Care Follow Up Note  Subjective:    Overnight Issues:   Objective:  Vital signs for last 24 hours: Temp:  [97.7 F (36.5 C)-99.2 F (37.3 C)] 97.7 F (36.5 C) (02/14 0735) Pulse Rate:  [59-78] 63 (02/14 0735) Resp:  [12-24] 20 (02/14 0735) BP: (110-159)/(62-85) 159/72 (02/14 0735) SpO2:  [88 %-96 %] 93 % (02/14 0735)  Hemodynamic parameters for last 24 hours:    Intake/Output from previous day: 02/13 0701 - 02/14 0700 In: 1080 [P.O.:1080] Out: 800 [Urine:800]  Intake/Output this shift: No intake/output data recorded.  Vent settings for last 24 hours:    Physical Exam:  Gen: comfortable, no distress Neuro: follows commands, alert, communicative HEENT: PERRL Neck: supple CV: RRR Pulm: unlabored breathing on RA Abd: soft, NT   , no recent BM GU: urine clear and yellow, +spontaneous voids Extr: wwp, no edema  No results found for this or any previous visit (from the past 24 hours).  Assessment & Plan: The plan of care was discussed with the bedside nurse for the day, Tresa Endo, who is in agreement with this plan and no additional concerns were raised.   Present on Admission:  Traumatic subarachnoid hemorrhage (HCC)    LOS: 2 days   Additional comments:I reviewed the patient's new clinical lab test results.   and I reviewed the patients new imaging test results.    35M s/p GLF   R SAH/SDH - stable repeat head CT, NSGY c/s, Dr. Yetta Barre, goal SBP<160, keppra, worsened HA and nausea today, add fioricet R occipital bone fracture - nonop, NSGY c/s, Dr. Yetta Barre, c-spine flex-ex films negative, otoscopic exam negative Insomnia - prn seroquel added HTN - off cardene gtt, resumed home antihypertensives FEN: regular diet, escalate bowel regimen, 1L NS bolus VTE: SCDs, start LMWH Dispo - 4NP   Diamantina Monks, MD Trauma & General Surgery Please use [email or web address] to contact on call provider  10/26/2023  *Care during the described time interval was  provided by me. I have reviewed this patient's available data, including medical history, events of note, physical examination and test results as part of my evaluation.

## 2023-10-27 MED ORDER — OXYCODONE HCL 5 MG PO TABS: 5.0000 mg | ORAL_TABLET | ORAL | 0 refills | Status: DC | PRN

## 2023-10-27 MED ORDER — IBUPROFEN 200 MG PO TABS
600.0000 mg | ORAL_TABLET | Freq: Once | ORAL | Status: AC
  Administered 2023-10-27: 600 mg via ORAL
  Filled 2023-10-27: qty 3

## 2023-10-27 MED ORDER — BUTALBITAL-APAP-CAFFEINE 50-325-40 MG PO TABS: 1.0000 | ORAL_TABLET | Freq: Four times a day (QID) | ORAL | 0 refills | Status: DC | PRN

## 2023-10-27 NOTE — Progress Notes (Signed)
   Trauma/Critical Care Follow Up Note  Subjective:    Overnight Issues:   Objective:  Vital signs for last 24 hours: Temp:  [97.8 F (36.6 C)-98.5 F (36.9 C)] 98.5 F (36.9 C) (02/15 0351) Pulse Rate:  [54-77] 77 (02/15 0351) Resp:  [14-17] 15 (02/15 0636) BP: (133-164)/(65-94) 137/94 (02/15 0636) SpO2:  [93 %-95 %] 95 % (02/15 0351)  Hemodynamic parameters for last 24 hours:    Intake/Output from previous day: 02/14 0701 - 02/15 0700 In: 360 [P.O.:360] Out: 1300 [Urine:1300]  Intake/Output this shift: No intake/output data recorded.  Vent settings for last 24 hours:    Physical Exam:  Gen: comfortable, no distress Neuro: follows commands, alert, communicative HEENT: PERRL Neck: supple CV: RRR Pulm: unlabored breathing on RA Abd: soft, NT   ,    GU: urine clear and yellow, +spontaneous voids Extr: wwp, no edema  No results found for this or any previous visit (from the past 24 hours).  Assessment & Plan:  Present on Admission:  Traumatic subarachnoid hemorrhage (HCC)    LOS: 3 days   Additional comments:I reviewed the patient's new clinical lab test results.   and I reviewed the patients new imaging test results.    61M s/p GLF   R SAH/SDH - stable repeat head CT, NSGY c/s, Dr. Yetta Barre, goal SBP<160, keppra, improved HA and nausea today with fioricet R occipital bone fracture - nonop, NSGY c/s, Dr. Yetta Barre, c-spine flex-ex films negative, otoscopic exam negative Insomnia - prn seroquel added HTN - off cardene gtt, resumed home antihypertensives FEN: regular diet, escalate bowel regimen, 1L NS bolus VTE: SCDs, start LMWH Dispo - 4NP, possible home today  Diamantina Monks, MD Trauma & General Surgery Please use [email or web address] to contact on call provider  10/27/2023  *Care during the described time interval was provided by me. I have reviewed this patient's available data, including medical history, events of note, physical examination and test results as  part of my evaluation.

## 2023-10-27 NOTE — Progress Notes (Signed)
 Occupational Therapy Treatment Patient Details Name: Maurice Park MRN: [id number] DOB: 1955/12/10 Today's Date: 10/27/2023   History of present illness 68 y/o male presents to [facility] on 2/12 after a ground level fall when playing tennis. Scans revealed a traumatic subarachnoid hemorrhage and an occipital fx . PMH includes: PAD, CAD (s/p CABG in 2011), HLD, HTN, and OSA, R THA   OT comments  Patient seated in recliner and agreeable to OT session.  Pt tolerating dim lights today, educated on progression of light tolerance and choice of lights vs use of sunglasses.  Pt able to manage LB dressing with min guard today. Worked on gaze stabilization, increased difficulty with turning to L compared to R and cueing to locate closer objects to prevent/reduce dizziness.  Good carryover with techniques.  Pt reports frustration with himself over injury and utilize therapeutic use of self to support. He remains hesitant for dc home due to headache. If pt remains admitted, will follow acutely.       If plan is discharge home, recommend the following:  Direct supervision/assist for financial management;Direct supervision/assist for medications management;Assistance with cooking/housework;A little help with walking and/or transfers;A little help with bathing/dressing/bathroom   Equipment Recommendations  Other (comment) (RW)    Recommendations for Other Services      Precautions / Restrictions Precautions Precautions: Fall;Other (comment) Recall of Precautions/Restrictions: Intact Precaution/Restrictions Comments: concussion recovery reviewed Restrictions Weight Bearing Restrictions Per Provider Order: No       Mobility Bed Mobility               General bed mobility comments: OOB upon entry in recliner    Transfers Overall transfer level: Needs assistance Equipment used: None Transfers: Sit to/from Stand Sit to Stand: Contact guard assist                 Balance Overall balance  assessment: Mild deficits observed, not formally tested                                         ADL either performed or assessed with clinical judgement   ADL Overall ADL's : Needs assistance/impaired                     Lower Body Dressing: Contact guard assist;Sit to/from stand Lower Body Dressing Details (indicate cue type and reason): figure 4 technique for socks today, increasd time; min guard in standing Toilet Transfer: Contact guard assist;Ambulation;Rolling walker (2 wheels)           Functional mobility during ADLs: Contact guard assist;Rolling walker (2 wheels) General ADL Comments: intermittent cueing for gaze stabilization, has increased difficulty with turning L compared to R and tends to need to find closer objects to focus on to reduce dizziness. Cueing for full turn in room instead of walking backwards.    Extremity/Trunk Assessment              Vision   Additional Comments: pt able to tolerate dim lights today, able to mobilize in hallway with lights and declined use of sunglasses.  Using gaze stabilization to prevent/correct dizziness during mobility.  Reviewed progression to tolerance of lights, natural light/background light vs overhead, use of sunglasses as needed for tolerance.   Perception     Praxis     Communication Communication Communication: No apparent difficulties   Cognition Arousal: Alert Behavior During Therapy: Flat affect  Cognition: No apparent impairments (concussion symptoms)             OT - Cognition Comments: pt continues to report dizziness with turning, has concussion symptoms and light sensitivity.  able to tolerate lights in hallway and dim lights in room.  Pt able to follow gaze stabilization techniques with minimal cueing today.Pt unable to complete multiple step tasks, focused attention to gaze and dizziness prevention today.                  Following commands: Intact        Cueing       Exercises      Shoulder Instructions       General Comments VSS on RA, spouse at side and supportive    Pertinent Vitals/ Pain       Pain Assessment Pain Assessment: Faces Faces Pain Scale: Hurts little more Pain Location: head Pain Descriptors / Indicators: Headache Pain Intervention(s): Limited activity within patient's tolerance, Monitored during session, Repositioned  Home Living                                          Prior Functioning/Environment              Frequency  Min 1X/week        Progress Toward Goals  OT Goals(current goals can now be found in the care plan section)  Progress towards OT goals: Progressing toward goals  Acute Rehab OT Goals Patient Stated Goal: be able to return to work OT Goal Formulation: With patient Time For Goal Achievement: 11/09/23 Potential to Achieve Goals: Good  Plan      Co-evaluation                 AM-PAC OT "6 Clicks" Daily Activity     Outcome Measure   Help from another person eating meals?: A Little Help from another person taking care of personal grooming?: A Little Help from another person toileting, which includes using toliet, bedpan, or urinal?: A Little Help from another person bathing (including washing, rinsing, drying)?: A Little Help from another person to put on and taking off regular upper body clothing?: A Little Help from another person to put on and taking off regular lower body clothing?: A Little 6 Click Score: 18    End of Session Equipment Utilized During Treatment: Gait belt;Rolling walker (2 wheels)  OT Visit Diagnosis: Unsteadiness on feet (R26.81);Muscle weakness (generalized) (M62.81)   Activity Tolerance Patient tolerated treatment well   Patient Left in chair;with call bell/phone within reach;with nursing/sitter in room;with family/visitor present   Nurse Communication Mobility status        Time: 1914-7829 OT Time Calculation (min): 32  min  Charges: OT General Charges $OT Visit: 1 Visit OT Treatments $Self Care/Home Management : 23-37 mins  Barry Brunner, OT Acute Rehabilitation Services Office [phone]   [name] 10/27/2023, 2:20 PM

## 2023-10-30 ENCOUNTER — Encounter (HOSPITAL_COMMUNITY): Payer: Self-pay | Admitting: Emergency Medicine

## 2023-10-30 ENCOUNTER — Emergency Department (HOSPITAL_COMMUNITY)
Admission: EM | Admit: 2023-10-30 | Discharge: 2023-10-31 | Disposition: A | Discharge status: Home / Self Care | Payer: 59 | Attending: Emergency Medicine | Admitting: Emergency Medicine

## 2023-10-30 ENCOUNTER — Emergency Department (HOSPITAL_COMMUNITY): Payer: 59

## 2023-10-30 ENCOUNTER — Other Ambulatory Visit: Payer: Self-pay

## 2023-10-30 DIAGNOSIS — I2510 Atherosclerotic heart disease of native coronary artery without angina pectoris: Secondary | ICD-10-CM | POA: Diagnosis not present

## 2023-10-30 DIAGNOSIS — G44309 Post-traumatic headache, unspecified, not intractable: Secondary | ICD-10-CM | POA: Diagnosis not present

## 2023-10-30 DIAGNOSIS — Z7982 Long term (current) use of aspirin: Secondary | ICD-10-CM | POA: Insufficient documentation

## 2023-10-30 DIAGNOSIS — R519 Headache, unspecified: Secondary | ICD-10-CM | POA: Diagnosis present

## 2023-10-30 DIAGNOSIS — Z87891 Personal history of nicotine dependence: Secondary | ICD-10-CM | POA: Diagnosis not present

## 2023-10-30 DIAGNOSIS — I10 Essential (primary) hypertension: Secondary | ICD-10-CM | POA: Diagnosis not present

## 2023-10-30 DIAGNOSIS — Z79899 Other long term (current) drug therapy: Secondary | ICD-10-CM | POA: Diagnosis not present

## 2023-10-30 LAB — CBC
HCT: 41.6 % (ref 39.0–52.0)
Hemoglobin: 14.3 g/dL (ref 13.0–17.0)
MCH: 31.8 pg (ref 26.0–34.0)
MCHC: 34.4 g/dL (ref 30.0–36.0)
MCV: 92.7 fL (ref 80.0–100.0)
Platelets: 199 10*3/uL (ref 150–400)
RBC: 4.49 MIL/uL (ref 4.22–5.81)
RDW: 12.3 % (ref 11.5–15.5)
WBC: 10.4 10*3/uL (ref 4.0–10.5)
nRBC: 0 % (ref 0.0–0.2)

## 2023-10-30 LAB — BASIC METABOLIC PANEL
Anion gap: 12 (ref 5–15)
BUN: 9 mg/dL (ref 8–23)
CO2: 25 mmol/L (ref 22–32)
Calcium: 9.1 mg/dL (ref 8.9–10.3)
Chloride: 101 mmol/L (ref 98–111)
Creatinine, Ser: 1.18 mg/dL (ref 0.61–1.24)
GFR, Estimated: >60 mL/min (ref >60)
Glucose, Bld: 132 mg/dL — ABNORMAL HIGH (ref 70–99)
Potassium: 3.9 mmol/L (ref 3.5–5.1)
Sodium: 138 mmol/L (ref 135–145)

## 2023-10-30 LAB — RESP PANEL BY RT-PCR (RSV, FLU A&B, COVID)  RVPGX2
Influenza A by PCR: NEGATIVE
Influenza B by PCR: NEGATIVE
Resp Syncytial Virus by PCR: NEGATIVE
SARS Coronavirus 2 by RT PCR: NEGATIVE

## 2023-10-30 MED ORDER — DIPHENHYDRAMINE HCL 50 MG/ML IJ SOLN
12.5000 mg | Freq: Once | INTRAMUSCULAR | Status: AC
  Administered 2023-10-30: 12.5 mg via INTRAVENOUS
  Filled 2023-10-30: qty 1

## 2023-10-30 MED ORDER — PROCHLORPERAZINE EDISYLATE 10 MG/2ML IJ SOLN
10.0000 mg | Freq: Once | INTRAMUSCULAR | Status: AC
  Administered 2023-10-30: 10 mg via INTRAVENOUS
  Filled 2023-10-30: qty 2

## 2023-10-30 MED ORDER — AMITRIPTYLINE HCL 10 MG PO TABS: 10.0000 mg | ORAL_TABLET | Freq: Every day | ORAL | 0 refills | Status: DC

## 2023-10-30 MED ORDER — AMITRIPTYLINE HCL 10 MG PO TABS
10.0000 mg | ORAL_TABLET | Freq: Once | ORAL | Status: AC
  Administered 2023-10-31: 10 mg via ORAL
  Filled 2023-10-30: qty 1

## 2023-10-30 MED ORDER — FENTANYL CITRATE PF 50 MCG/ML IJ SOSY
50.0000 ug | PREFILLED_SYRINGE | Freq: Once | INTRAMUSCULAR | Status: AC
  Administered 2023-10-30: 50 ug via INTRAVENOUS
  Filled 2023-10-30: qty 1

## 2023-10-30 NOTE — ED Triage Notes (Signed)
 Pt BIB by EMS for ongoing headache. Recent discharge from hospital on Saturday wit SAH. On discharge, headache present but pain has gotten progressively worse. Pt states pain is 10/10 despite taking prescribed pain medications. Pt anxious and restless on arrival.

## 2023-10-30 NOTE — Discharge Instructions (Addendum)
 We evaluated you for your headache.  We believe your headache is most likely due to your recent head injury.  We obtained a repeat head CT scan in the emergency department today which did not show any worsening or new bleeding.  It is common to get severe headaches after suffering a head injury.  Your symptoms improved with headache treatment.  We feel that it is safe to go home and follow-up with a neurologist in 2 days as scheduled.  We discussed your headache treatment with our neurologist here.  They recommend that we start you on a medication called amitriptyline every night.  This can help prevent further headaches.  If you do develop any new or worsening symptoms such as severe uncontrolled headache, confusion, high fevers, vomiting, or any other concerning symptoms, please return to the emergency department.

## 2023-10-30 NOTE — ED Provider Notes (Signed)
  EMERGENCY DEPARTMENT AT [facility] Provider Note  CSN: [id number] Arrival date & time: 10/30/23 2041  Chief Complaint(s) Headache  HPI Maurice Park is a 68 y.o. male with recent head injury, subarachnoid hemorrhage presenting with headache.  Reports he has had significant headache since discharge, worsened yesterday.  Also reports feeling "spasms" in his arms and legs.  Has been taking oxycodone and Fioricet without significant improvement.  No new head injury.  Reports nausea, no vomiting.  No neck stiffness, fevers.  No weakness in his arms or legs.  Reports photophobia as well,   Past Medical History Past Medical History:  Diagnosis Date   Angina    Anxiety    Back pain    CAD (coronary artery disease), with CABG Lima to LAD, Lt. radial artery to 1st diagnal, sequential SVG to PDA and PLA, 12/20/09 April 2011   Carotid artery disease [facility])    status post right carotid endarterectomy   Chest pain    Dysrhythmia    History of heart attack    Hyperlipidemia, controlled    Hypertension    Joint pain    Lactose intolerance    Palpitations    Peripheral vascular disease (HCC)    Renal artery stenosis (HCC)    Right hip pain    Situational mixed anxiety and depressive disorder,     Sleep apnea    SOB (shortness of breath)    Patient Active Problem List   Diagnosis Date Noted   Traumatic subarachnoid hemorrhage (HCC) 10/24/2023   Peripheral arterial disease (HCC) 03/11/2021   Numbness and tingling of right leg 01/27/2021   Costochondritis 11/15/2020   Metabolic syndrome 11/11/2020   Prediabetes 10/18/2020   Class 1 obesity with serious comorbidity and body mass index (BMI) of 32.0 to 32.9 in adult 10/18/2020   Essential hypertension 07/02/2018   Renal artery stenosis (HCC) 07/24/2013   Carotid artery disease (HCC) 07/24/2013   Angina at rest [facility]) 10/02/2011   CAD (coronary artery disease), with CABG Lima to LAD, Lt. radial artery to 1st diagnal,  sequential SVG to PDA and PLA, 12/20/09 10/02/2011   Hyperlipidemia, controlled 10/02/2011   Situational mixed anxiety and depressive disorder,  10/02/2011   Home Medication(s) Prior to Admission medications   Medication Sig Start Date End Date Taking? Authorizing Provider  amitriptyline (ELAVIL) 10 MG tablet Take 1 tablet (10 mg total) by mouth at bedtime. 10/30/23  Yes Lonell Grandchild, MD  aspirin EC 81 MG tablet Take 81 mg by mouth at bedtime.    [provider]  butalbital-acetaminophen-caffeine (FIORICET) [id number] MG tablet Take 1-2 tablets by mouth every 6 (six) hours as needed for headache. 10/27/23 10/26/24  Diamantina Monks, MD  ezetimibe-simvastatin (VYTORIN) 10-20 MG tablet TAKE 1 TABLET BY MOUTH EVERY DAY 01/15/23   Runell Gess, MD  fluticasone (FLONASE) 50 MCG/ACT nasal spray Place 2 sprays into both nostrils daily. 03/09/21   [provider]  hydrALAZINE (APRESOLINE) 25 MG tablet Take 1 tablet (25 mg total) by mouth in the morning and at bedtime. 09/03/23 12/02/23  Runell Gess, MD  metoprolol succinate (TOPROL-XL) 50 MG 24 hr tablet TAKE 1 TABLET BY MOUTH EVERY DAY WITH OR IMMEDIATELY FOLLOWING A MEAL 01/15/23   Runell Gess, MD  Multiple Vitamin (MULTIVITAMIN) capsule Take 1 capsule by mouth daily.    [provider]  nitroGLYCERIN (NITROSTAT) 0.4 MG SL tablet Place 1 tablet (0.4 mg total) under the tongue every 5 (five) minutes  as needed for up to 25 days for chest pain. 07/25/19 10/23/24  Runell Gess, MD  oxyCODONE (ROXICODONE) 5 MG immediate release tablet Take 1 tablet (5 mg total) by mouth every 4 (four) hours as needed for severe pain (pain score 7-10). 10/27/23   Diamantina Monks, MD  ramipril (ALTACE) 5 MG capsule TAKE 1 CAPSULE BY MOUTH EVERY DAY Patient taking differently: Take 5 mg by mouth daily. 08/30/20   Runell Gess, MD  ranolazine (RANEXA) 500 MG 12 hr tablet TAKE 1 TABLET BY MOUTH TWICE A DAY 09/21/23   Runell Gess, MD  sildenafil (VIAGRA) 100 MG tablet Take 1 tablet (100 mg total) by mouth daily as needed for erectile dysfunction. 01/04/23   Runell Gess, MD                                                                                                                                    Past Surgical History Past Surgical History:  Procedure Laterality Date   ABDOMINAL AORTOGRAM W/LOWER EXTREMITY N/A 03/28/2021   Procedure: ABDOMINAL AORTOGRAM W/LOWER EXTREMITY;  Surgeon: Runell Gess, MD;  Location: [facility] INVASIVE CV LAB;  Service: Cardiovascular;  Laterality: N/A;  limited runoff   ANTERIOR CRUCIATE LIGAMENT REPAIR Left    CARDIAC CATHETERIZATION  12/14/2009   multivessel CAD >> CABG (Dr. Nicki Guadalajara)   CARDIAC CATHETERIZATION  10/03/2011   L Cfx w/80% ostial stenosis, ramus with 50% segmental mid stenosis; RCA dominant & occluded in midportion; LIMA to LAD patent; SVG to PDA/PLA patent; free L radial to diagonal functionally occluded (Dr. Erlene Quan)   CAROTID DOPPLER  11/2011   R CEA w/normal patency; left bulb w/0-49% diameter reduction   CAROTID ENDARTERECTOMY  05/03/2006   Dr. Tawanna Cooler Early   CORONARY ANGIOPLASTY  02/21/2000   PTCA with 3.25x41mm [employer] Monor[employer] balloon of in-stent posterolateral branch restenosis, reduced from 60% to under 20% (Dr. Erlene Quan)   CORONARY ARTERY BYPASS GRAFT  12/20/2009   LIMA-LAD, left radial-1st diagonal, SVG-PDA & PLA (Dr. Kathie Rhodes. Hendrickson)   HIP ARTHROSCOPY W/ LABRAL REPAIR     LEFT HEART CATHETERIZATION WITH CORONARY/GRAFT ANGIOGRAM N/A 10/03/2011   Procedure: LEFT HEART CATHETERIZATION WITH Isabel Caprice;  Surgeon: Runell Gess, MD;  Location: [facility] CATH LAB;  Service: Cardiovascular;  Laterality: N/A;   LOWER EXTREMITY ARTERIAL DOPPLER  11/2005   normal study    NM MYOCAR IMG MI  04/2011   bruce myoview - normal perfusion; EF 58%; low risk scan   PERIPHERAL VASCULAR INTERVENTION  03/28/2021   Procedure: PERIPHERAL VASCULAR INTERVENTION;   Surgeon: Runell Gess, MD;  Location: [facility] INVASIVE CV LAB;  Service: Cardiovascular;;  Right common iliac stent   RENAL DOPPLER  12/2012   right renal stent w/1-59% diameter reduction   TRANSTHORACIC ECHOCARDIOGRAM  04/2011   EF=>55%; trace MR; mild TR   Family History Family History  Problem Relation Age  of Onset   Hypertension Mother 66   Diabetes Mother    Hyperlipidemia Mother    Coronary artery disease Father 8   Heart attack Father 88   Hypertension Father    Heart disease Father    Sudden death Father    Heart disease Paternal Grandmother 21   Stroke Paternal Grandmother 25   Heart attack Paternal Grandfather 37   Hyperlipidemia Brother     Social History Social History   Tobacco Use   Smoking status: Former    Current packs/day: 0.00    Average packs/day: 1 pack/day for 12.0 years (12.0 ttl pk-yrs)    Types: Cigarettes    Start date: 11/09/1969    Quit date: 11/09/1981    Years since quitting: 42.0   Smokeless tobacco: Never  Substance Use Topics   Alcohol use: Yes    Alcohol/week: 3.0 standard drinks of alcohol    Types: 3 Cans of beer per week    Comment: last drink was in november   Drug use: No   Allergies Hydrocortisone, Other, Codeine, Contrast media [iodinated contrast media], and Neosporin [neomycin-polymyxin-gramicidin]  Review of Systems Review of Systems  All other systems reviewed and are negative.   Physical Exam Vital Signs  I have reviewed the triage vital signs BP (!) 177/84   Pulse 65   Temp 98.3 F (36.8 C) (Oral)   Resp 18   Ht 5\' 4"  (1.626 m)   Wt 86.2 kg   SpO2 97%   BMI 32.61 kg/m  Physical Exam Vitals and nursing note reviewed.  Constitutional:      General: He is not in acute distress.    Appearance: Normal appearance.  HENT:     Mouth/Throat:     Mouth: Mucous membranes are moist.  Eyes:     Conjunctiva/sclera: Conjunctivae normal.  Cardiovascular:     Rate and Rhythm: Normal rate and regular rhythm.   Pulmonary:     Effort: Pulmonary effort is normal. No respiratory distress.     Breath sounds: Normal breath sounds.  Abdominal:     General: Abdomen is flat.     Palpations: Abdomen is soft.     Tenderness: There is no abdominal tenderness.  Musculoskeletal:     Cervical back: Neck supple. No rigidity.     Right lower leg: No edema.     Left lower leg: No edema.  Skin:    General: Skin is warm and dry.     Capillary Refill: Capillary refill takes less than 2 seconds.  Neurological:     Mental Status: He is alert and oriented to person, place, and time. Mental status is at baseline.     Comments: Cranial nerves II through XII intact, strength 5 out of 5 in the bilateral upper and lower extremities, no sensory deficit to light touch, no dysmetria on finger-nose-finger testing, ambulatory with steady gait.  Psychiatric:        Mood and Affect: Mood normal.        Behavior: Behavior normal.     ED Results and Treatments Labs (all labs ordered are listed, but only abnormal results are displayed) Labs Reviewed  BASIC METABOLIC PANEL - Abnormal; Notable for the following components:      Result Value   Glucose, Bld 132 (*)    All other components within normal limits  RESP PANEL BY RT-PCR (RSV, FLU A&B, COVID)  RVPGX2  CBC  Radiology CT Head Wo Contrast Result Date: 10/30/2023 CLINICAL DATA:  Headache EXAM: CT HEAD WITHOUT CONTRAST TECHNIQUE: Contiguous axial images were obtained from the base of the skull through the vertex without intravenous contrast. RADIATION DOSE REDUCTION: This exam was performed according to the departmental dose-optimization program which includes automated exposure control, adjustment of the mA and/or kV according to patient size and/or use of iterative reconstruction technique. COMPARISON:  10/25/2023 FINDINGS: Brain: Unchanged appearance  of small anterior frontal lobe hemorrhagic contusions (series 5, image 57). No other site of hemorrhage or extra-axial collection. No midline shift or other mass effect. Vascular: Atherosclerotic calcification of the vertebral and internal carotid arteries at the skull base. No abnormal hyperdensity of the major intracranial arteries or dural venous sinuses. Skull: Unchanged appearance of nondisplaced right occipital fracture. Sinuses/Orbits: Fluid in the right mastoid air cells. Other: None. IMPRESSION: 1. Unchanged appearance of small anterior frontal lobe hemorrhagic contusions. 2. Unchanged appearance of nondisplaced right occipital fracture. Electronically Signed   By: Deatra Robinson M.D.   On: 10/30/2023 22:17    Pertinent labs & imaging results that were available during my care of the patient were reviewed by me and considered in my medical decision making (see MDM for details).  Medications Ordered in ED Medications  amitriptyline (ELAVIL) tablet 10 mg (has no administration in time range)  fentaNYL (SUBLIMAZE) injection 50 mcg (50 mcg Intravenous Given 10/30/23 2208)  prochlorperazine (COMPAZINE) injection 10 mg (10 mg Intravenous Given 10/30/23 2216)  diphenhydrAMINE (BENADRYL) injection 12.5 mg (12.5 mg Intravenous Given 10/30/23 2215)                                                                                                                                     Procedures Procedures  (including critical care time)  Medical Decision Making / ED Course   MDM:  68 year old presenting to the emergency department with headache.  Patient well-appearing, seems uncomfortable.  Suspect postconcussion headache from recent severe head injury with subarachnoid bleed.  repeat head CT was obtained to evaluate for worsening bleeding or other acute process.  No evidence of any change from head CT on discharge.  His neurologic exam is intact with no acute finding.  Low concern for other process  such as infection, no meningismus, no fever in ER, no leukocytosis.  Considered venous sinus thrombosis, no seizures, no displaced skull fracture.  Reports his headache is almost completely resolved after receiving a dose of migraine cocktail.  Discussed with Dr. Amada Jupiter with neurology whether there is any other medications the patient should be on right now, he recommends initiating amitriptyline.  The patient does follow-up with neurology in 2 days.  Recommended strict ER precautions for any worsening. Will discharge patient to home. All questions answered. Patient comfortable with plan of discharge. Return precautions discussed with patient and specified on the after visit summary.       Additional history obtained: -Additional history  obtained from spouse -External records from outside source obtained and reviewed including: Chart review including previous notes, labs, imaging, consultation notes including prior CT scans and er visit    Lab Tests: -I ordered, reviewed, and interpreted labs.   The pertinent results include:   Labs Reviewed  BASIC METABOLIC PANEL - Abnormal; Notable for the following components:      Result Value   Glucose, Bld 132 (*)    All other components within normal limits  RESP PANEL BY RT-PCR (RSV, FLU A&B, COVID)  RVPGX2  CBC    Notable for borderline hyperglycemia   EKG   EKG Interpretation Date/Time:  Tuesday October 30 2023 21:06:30 EST Ventricular Rate:  62 PR Interval:  138 QRS Duration:  72 QT Interval:  410 QTC Calculation: 416 R Axis:   94  Text Interpretation: Normal sinus rhythm Rightward axis Nonspecific ST abnormality Abnormal ECG When compared with ECG of 24-Oct-2023 20:49, PREVIOUS ECG IS PRESENT Confirmed by Alvino Blood ([id number]) on 10/30/2023 9:53:36 PM         Imaging Studies ordered: I ordered imaging studies including CT head On my interpretation imaging demonstrates no acute change from prior  I independently  visualized and interpreted imaging. I agree with the radiologist interpretation   Medicines ordered and prescription drug management: Meds ordered this encounter  Medications   fentaNYL (SUBLIMAZE) injection 50 mcg   prochlorperazine (COMPAZINE) injection 10 mg   diphenhydrAMINE (BENADRYL) injection 12.5 mg   amitriptyline (ELAVIL) tablet 10 mg   amitriptyline (ELAVIL) 10 MG tablet    Sig: Take 1 tablet (10 mg total) by mouth at bedtime.    Dispense:  30 tablet    Refill:  0    -I have reviewed the patients home medicines and have made adjustments as needed   Consultations Obtained: I requested consultation with the neurologist,  and discussed lab and imaging findings as well as pertinent plan - they recommend: add amitriptyline     Social Determinants of Health:  Diagnosis or treatment significantly limited by social determinants of health: obesity   Reevaluation: After the interventions noted above, I reevaluated the patient and found that their symptoms have improved  Co morbidities that complicate the patient evaluation  Past Medical History:  Diagnosis Date   Angina    Anxiety    Back pain    CAD (coronary artery disease), with CABG [place] to LAD, Lt. radial artery to 1st diagnal, sequential SVG to PDA and PLA, 12/20/09 April 2011   Carotid artery disease (HCC)    status post right carotid endarterectomy   Chest pain    Dysrhythmia    History of heart attack    Hyperlipidemia, controlled    Hypertension    Joint pain    Lactose intolerance    Palpitations    Peripheral vascular disease (HCC)    Renal artery stenosis (HCC)    Right hip pain    Situational mixed anxiety and depressive disorder,     Sleep apnea    SOB (shortness of breath)       Dispostion: Disposition decision including need for hospitalization was considered, and patient discharged from emergency department.    Final Clinical Impression(s) / ED Diagnoses Final diagnoses:   Post-concussion headache     This chart was dictated using voice recognition software.  Despite best efforts to proofread,  errors can occur which can change the documentation meaning.    Lonell Grandchild, MD 10/31/23 [phone]

## 2023-10-30 NOTE — ED Provider Triage Note (Addendum)
 Emergency Medicine Provider Triage Evaluation Note  Maurice Park , a 68 y.o. male  was evaluated in triage.  Pt complains of headache.  History of traumatic SAH last week.  States has had a headache since his discharge but last night it was notably worse.  Also noted his blood pressure was really high as well.  States he is having sensitivity to light.  Denies chest pain or shortness of breath at this time.  Denies any numbness or weakness in his extremities facial droop or slurred speech.  States the pain from his head radiates down the right side of his body.  Review of Systems  Positive: See above Negative: See above  Physical Exam  BP (!) 194/69 (BP Location: Left Arm)   Pulse 64   Temp 98.3 F (36.8 C) (Oral)   Resp 20   SpO2 100%  Gen:   Awake, no distress   Resp:  Normal effort  MSK:   Moves extremities without difficulty  Other:    Medical Decision Making  Medically screening exam initiated at 8:54 PM.  Appropriate orders placed.  Maurice Park was informed that the remainder of the evaluation will be completed by another provider, this initial triage assessment does not replace that evaluation, and the importance of remaining in the ED until their evaluation is complete.  Work up started      Gareth Eagle, [place] 10/30/23 2057

## 2023-10-30 NOTE — ED Notes (Signed)
 Patient transported to CT

## 2023-10-30 NOTE — ED Notes (Signed)
 Wife/significant other at bedside.

## 2023-10-30 NOTE — ED Notes (Signed)
 Patient returned from CT

## 2023-10-30 NOTE — ED Notes (Signed)
 Patient given ice chips.

## 2023-10-31 NOTE — Discharge Summary (Signed)
 Physician Discharge Summary  Patient ID: Maurice Park MRN: [id number] DOB/AGE: Aug 26, 1956 68 y.o.  Admit date: 10/24/2023 Discharge date: 10/27/2023  Discharge Diagnoses Ground level fall Right SAH/SDH Right occipital bone fracture Insomnia  HTN  Consultants Neurosurgery   Procedures None   HPI: Patient  is a 68 yo male who presented to the ED as a level 2 trauma after a ground-level fall. He was playing tennis and fell backward and hit the back of his head. He does not remember what happened immediately before the fall, and per report was unconscious for about 2 minutes. He was hemodynamically stable. A CT scan scan showed a 4mm SAH/SDH and a right occipital bone fracture. Trauma was consulted for admission. The patient was hypertensive and was started on a cardene gtt. He reported a headache. Denied chest pain and abdominal pain. He stated that when he swallows it feels like there is fluid in the right ear. PMH includes PAD, CAD (s/p CABG in 2011), HLD, HTN, and OSA. He takes a baby aspirin daily but no other blood thinners. Admitted to trauma ICU.  Hospital Course: Neurosurgery consulted and recommended follow up head CT in the morning. This was done and was stable. Flexion extension films also done and cervical spine cleared. He was able to wean off cardene drip and transition back to home antihypertensives. Otoscopic exam was also negative for CSF leak. He was transferred out of ICU 2/13. He was evaluated by TBI therapies and recommended for outpatient therapies. Patient was discharged 10/27/23 in stable condition with follow up as outlined below.   I did not directly care for this patient, information in summary taken from chart review.    Allergies as of 10/27/2023       Reactions   Hydrocortisone Hives   Other Swelling   Any topical bacitracin cream   Codeine Nausea Only   Contrast Media [iodinated Contrast Media] Hives   Neosporin [neomycin-polymyxin-gramicidin] Swelling         Medication List     STOP taking these medications    acetaminophen 650 MG CR tablet Commonly known as: TYLENOL       TAKE these medications    aspirin EC 81 MG tablet Take 81 mg by mouth at bedtime.   butalbital-acetaminophen-caffeine 50-325-40 MG tablet Commonly known as: FIORICET Take 1-2 tablets by mouth every 6 (six) hours as needed for headache.   ezetimibe-simvastatin 10-20 MG tablet Commonly known as: VYTORIN TAKE 1 TABLET BY MOUTH EVERY DAY   fluticasone 50 MCG/ACT nasal spray Commonly known as: FLONASE Place 2 sprays into both nostrils daily.   hydrALAZINE 25 MG tablet Commonly known as: APRESOLINE Take 1 tablet (25 mg total) by mouth in the morning and at bedtime.   metoprolol succinate 50 MG 24 hr tablet Commonly known as: TOPROL-XL TAKE 1 TABLET BY MOUTH EVERY DAY WITH OR IMMEDIATELY FOLLOWING A MEAL   multivitamin capsule Take 1 capsule by mouth daily.   nitroGLYCERIN 0.4 MG SL tablet Commonly known as: NITROSTAT Place 1 tablet (0.4 mg total) under the tongue every 5 (five) minutes as needed for up to 25 days for chest pain.   oxyCODONE 5 MG immediate release tablet Commonly known as: Roxicodone Take 1 tablet (5 mg total) by mouth every 4 (four) hours as needed for severe pain (pain score 7-10).   ramipril 5 MG capsule Commonly known as: ALTACE TAKE 1 CAPSULE BY MOUTH EVERY DAY What changed: how much to take   ranolazine 500 MG 12  hr tablet Commonly known as: RANEXA TAKE 1 TABLET BY MOUTH TWICE A DAY   sildenafil 100 MG tablet Commonly known as: VIAGRA Take 1 tablet (100 mg total) by mouth daily as needed for erectile dysfunction.          Follow-up Information     Arman Bogus, MD. Call.   Specialty: Neurosurgery Why: As needed for follow up of TBI Contact information: [address] [place] [address] [phone]         [facility]. Call.   Specialty:  Rehabilitation Why: Please call ASAP to schedule outpatient physical, occupational and speech therapy appts.  An electronic referral has been sent on your behalf. Contact information: [address] [place] [address] [phone]                Signed: Juliet Rude , [facility] 10/31/2023, 2:49 PM Please see Amion for pager number during day hours 7:00am-4:30pm

## 2023-11-01 ENCOUNTER — Telehealth: Payer: Self-pay | Admitting: Cardiovascular Disease

## 2023-11-01 NOTE — Telephone Encounter (Signed)
 Patient identification verified by 2 forms. Marilynn Rail, RN    Called and spoke to patients wife [name]  Anna/patient states:   -last week patient fell hit is head, mild TBI, skull fracture   -was discharged on 2/15  -since discharge developed shooting pain down his leg   -pain will occur at rest and when weight bearing   -pain occurs daily, multiple times a day   -sensation occurs in both legs and buttocks, more prominent in right leg   -feels like a burning sensation/cramp   -pain is unbearable   -when pain occurs he becomes weak  -unsure if related to TBI or cardiac related   -has history of stent placement 2022 in right side, unsure if has a clot from brain bleed   -was unable to meet with trauma Neurology provider today, Dr. Myna Bright appointment   -is out of refills on pain medication: Fioricet, oxycodone   -neither pain medication provided relief   -would like to know what pain medication [name] recommends  Patient denies:    -new numbness/tingling  Would like to know: -if [name] can review recent ED/admission    -if [name] thinks it is cardiac related   -what pain medications [name] recommends  Informed patient message sent to [name] for input  Patient verbalized understanding, no questions at this time

## 2023-11-01 NOTE — Telephone Encounter (Signed)
 Maurice Gess, MD  You6 minutes ago (3:00 PM)    Pain sounds neuogenic/orthopedic. Prob should see his PCP, Dr Clelia Croft , for analgesics and further eval   Patient identification verified by 2 forms. Marilynn Rail, RN    Called and spoke to patients wife [name] provider message  [name] verbalized understanding, no questions at this time

## 2023-11-01 NOTE — Telephone Encounter (Signed)
 Wife called to speak with the nurse to discuss the leg pain that the patient is feeling. Thinks its has something to do with his blood flow. Please advise

## 2023-11-05 ENCOUNTER — Other Ambulatory Visit (HOSPITAL_COMMUNITY): Payer: Self-pay | Admitting: Neurological Surgery

## 2023-11-05 DIAGNOSIS — S02119D Unspecified fracture of occiput, subsequent encounter for fracture with routine healing: Secondary | ICD-10-CM

## 2023-11-07 ENCOUNTER — Ambulatory Visit: Payer: 59 | Attending: Physician Assistant | Admitting: Occupational Therapy

## 2023-11-07 ENCOUNTER — Other Ambulatory Visit: Payer: Self-pay

## 2023-11-07 ENCOUNTER — Encounter: Payer: Self-pay | Admitting: Physical Medicine & Rehabilitation

## 2023-11-07 ENCOUNTER — Encounter: Payer: Self-pay | Admitting: Occupational Therapy

## 2023-11-07 DIAGNOSIS — S066XAA Traumatic subarachnoid hemorrhage with loss of consciousness status unknown, initial encounter: Secondary | ICD-10-CM | POA: Insufficient documentation

## 2023-11-07 DIAGNOSIS — M6281 Muscle weakness (generalized): Secondary | ICD-10-CM | POA: Diagnosis present

## 2023-11-07 DIAGNOSIS — R2689 Other abnormalities of gait and mobility: Secondary | ICD-10-CM | POA: Diagnosis present

## 2023-11-07 DIAGNOSIS — R2681 Unsteadiness on feet: Secondary | ICD-10-CM | POA: Insufficient documentation

## 2023-11-07 DIAGNOSIS — M79605 Pain in left leg: Secondary | ICD-10-CM | POA: Diagnosis present

## 2023-11-07 DIAGNOSIS — R6889 Other general symptoms and signs: Secondary | ICD-10-CM | POA: Diagnosis present

## 2023-11-07 DIAGNOSIS — M25551 Pain in right hip: Secondary | ICD-10-CM | POA: Diagnosis present

## 2023-11-07 DIAGNOSIS — R42 Dizziness and giddiness: Secondary | ICD-10-CM | POA: Diagnosis present

## 2023-11-07 DIAGNOSIS — R29818 Other symptoms and signs involving the nervous system: Secondary | ICD-10-CM | POA: Diagnosis present

## 2023-11-07 NOTE — Therapy (Signed)
 OUTPATIENT OCCUPATIONAL THERAPY NEURO EVALUATION  Patient Name: Maurice Park MRN: [id number] DOB:01-Oct-1955, 68 y.o., male Today's Date: 11/07/2023  PCP: Lupita Raider, MD  REFERRING PROVIDER: Juliet Rude, PA-C  END OF SESSION:  OT End of Session - 11/07/23 1407     Visit Number 1    Number of Visits 13    Date for OT Re-Evaluation 12/21/23    Authorization Type [employer]    OT Start Time 1406    OT Stop Time 1512    OT Time Calculation (min) 66 min    Activity Tolerance Patient tolerated treatment well    Behavior During Therapy WFL for tasks assessed/performed             Past Medical History:  Diagnosis Date   Angina    Anxiety    Back pain    CAD (coronary artery disease), with CABG [place] to LAD, Lt. radial artery to 1st diagnal, sequential SVG to PDA and PLA, 12/20/09 April 2011   Carotid artery disease (HCC)    status post right carotid endarterectomy   Chest pain    Dysrhythmia    History of heart attack    Hyperlipidemia, controlled    Hypertension    Joint pain    Lactose intolerance    Palpitations    Peripheral vascular disease (HCC)    Renal artery stenosis (HCC)    Right hip pain    Situational mixed anxiety and depressive disorder,     Sleep apnea    SOB (shortness of breath)    Past Surgical History:  Procedure Laterality Date   ABDOMINAL AORTOGRAM W/LOWER EXTREMITY N/A 03/28/2021   Procedure: ABDOMINAL AORTOGRAM W/LOWER EXTREMITY;  Surgeon: Runell Gess, MD;  Location: [facility];  Service: Cardiovascular;  Laterality: N/A;  limited runoff   ANTERIOR CRUCIATE LIGAMENT REPAIR Left    CARDIAC CATHETERIZATION  12/14/2009   multivessel CAD >> CABG (Dr. Nicki Guadalajara)   CARDIAC CATHETERIZATION  10/03/2011   L Cfx w/80% ostial stenosis, ramus with 50% segmental mid stenosis; RCA dominant & occluded in midportion; LIMA to LAD patent; SVG to PDA/PLA patent; free L radial to diagonal functionally occluded (Dr. Erlene Quan)   CAROTID DOPPLER   11/2011   R CEA w/normal patency; left bulb w/0-49% diameter reduction   CAROTID ENDARTERECTOMY  05/03/2006   Dr. Tawanna Cooler Early   CORONARY ANGIOPLASTY  02/21/2000   PTCA with 3.25x55mm Quantum Monorail balloon of in-stent posterolateral branch restenosis, reduced from 60% to under 20% (Dr. Erlene Quan)   CORONARY ARTERY BYPASS GRAFT  12/20/2009   LIMA-LAD, left radial-1st diagonal, SVG-PDA & PLA (Dr. Kathie Rhodes. Hendrickson)   HIP ARTHROSCOPY W/ LABRAL REPAIR     LEFT HEART CATHETERIZATION WITH CORONARY/GRAFT ANGIOGRAM N/A 10/03/2011   Procedure: LEFT HEART CATHETERIZATION WITH Isabel Caprice;  Surgeon: Runell Gess, MD;  Location: [facility] CATH LAB;  Service: Cardiovascular;  Laterality: N/A;   LOWER EXTREMITY ARTERIAL DOPPLER  11/2005   normal study    NM MYOCAR IMG MI  04/2011   bruce myoview - normal perfusion; EF 58%; low risk scan   PERIPHERAL VASCULAR INTERVENTION  03/28/2021   Procedure: PERIPHERAL VASCULAR INTERVENTION;  Surgeon: Runell Gess, MD;  Location: [facility];  Service: Cardiovascular;;  Right common iliac stent   RENAL DOPPLER  12/2012   right renal stent w/1-59% diameter reduction   TRANSTHORACIC ECHOCARDIOGRAM  04/2011   EF=>55%; trace MR; mild TR   Patient Active Problem List  Diagnosis Date Noted   Traumatic subarachnoid hemorrhage (HCC) 10/24/2023   Peripheral arterial disease (HCC) 03/11/2021   Numbness and tingling of right leg 01/27/2021   Costochondritis 11/15/2020   Metabolic syndrome 11/11/2020   Prediabetes 10/18/2020   Class 1 obesity with serious comorbidity and body mass index (BMI) of 32.0 to 32.9 in adult 10/18/2020   Essential hypertension 07/02/2018   Renal artery stenosis (HCC) 07/24/2013   Carotid artery disease (HCC) 07/24/2013   Angina at rest [facility]) 10/02/2011   CAD (coronary artery disease), with CABG [place] to LAD, Lt. radial artery to 1st diagnal, sequential SVG to PDA and PLA, 12/20/09 10/02/2011   Hyperlipidemia, controlled  10/02/2011   Situational mixed anxiety and depressive disorder,  10/02/2011    ONSET DATE: 10/26/2023 (Date of referral)  REFERRING DIAG: S06.6XAA (ICD-10-CM) - Traumatic subarachnoid hemorrhage with unknown loss of consciousness status, initial encounter   THERAPY DIAG:  Other symptoms and signs involving the nervous system  Decreased functional activity tolerance  Traumatic subarachnoid hemorrhage with unknown loss of consciousness status, initial encounter [facility])  Rationale for Evaluation and Treatment: Rehabilitation  SUBJECTIVE:   SUBJECTIVE STATEMENT: Reports symptoms are generally improving. Most limited by pain and fatigue. 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 He has had R hip surgery in 2014 with more recent pain in this region since his accident. He has been able to play guitar since his accident.   He is to have MRI to rule out neck injury/involvement.  Pt accompanied by: friend - Kim, also OT  PERTINENT HISTORY: PMH includes: PAD, CAD (s/p CABG in 2011), HLD, HTN, and OSA, R THA   Pt admitted to [facility] on 10/24/2023 after a ground level fall when playing tennis. Scans revealed a traumatic subarachnoid hemorrhage and an occipital fx   PRECAUTIONS: Fall and Other: concussion  WEIGHT BEARING RESTRICTIONS: No  PAIN:  Are you having pain? Yes: NPRS scale: 3/10 Pain location: headache Pain description: throbbing Aggravating factors: fatigue Relieving factors: rest, medications  FALLS: Has patient fallen in last 6 months? Yes. Number of falls 1 - fell when playing tennis, resulting in [facility]  LIVING ENVIRONMENT: Family/patient expects to be discharged to:: Private residence Living Arrangements: Spouse/significant other Available Help at Discharge: Family Type of Home: House Home Access: Other (comment) (2 steps in house, no railings to support) Home Layout: One level Bathroom Shower/Tub: Health visitor: Handicapped height Bathroom  Accessibility: Yes Home Equipment: Crutches;Shower seat - built in;Grab bars - tub/shower Additional Comments: dog that is blind 109 yo  Lives With: Spouse    PLOF: Independent; was driving; Electronics engineer with lots of computer, phone, and video chats  PATIENT GOALS: return to playing guitar and outdoor work  OBJECTIVE:  Note: Objective measures were completed at Evaluation unless otherwise noted.  HAND DOMINANCE: Right  ADLs: Eating: is cautious around sharp items such as a knife Grooming: unable to bend forward to spit into sink UB Dressing: I LB Dressing: requires help to don shoes and socks as reaching provokes symptoms Toileting: mod I Bathing: mod I Equipment: Grab bars, Walk in shower, Reacher, and HHSH  IADLs: Shopping: has not tried Light housekeeping: light housekeeping only Meal Prep: dependent Community mobility: dependent Medication management: mod I Financial management: mod I Handwriting:  no change  MOBILITY STATUS: Needs Assist: Rollator for long distances, cane in home  ACTIVITY TOLERANCE: Activity tolerance: poor  FUNCTIONAL OUTCOME MEASURES: PSFS: 1.3 total score   Total score = sum of the  activity scores/number of activities Minimum detectable change (90%CI) for average score = 2 points Minimum detectable change (90%CI) for single activity score = 3 points   UPPER EXTREMITY ROM and MMT:    BUE: WNL  HAND FUNCTION: Grip strength: Right: 72.3 lbs; Left: 69.6 lbs  COORDINATION: 9 Hole Peg test: Right: 25 sec; Left: 22 sec  SENSATION: No paresthesias in hands  EDEMA: none reported or observed  MUSCLE TONE: BUE WFL  COGNITION: Overall cognitive status: Within functional limits for tasks assessed  VISION: Subjective report: no change noticed other than fatigue Baseline vision: Bifocals, Wears glasses all the time, and progressive lenses Visual history:  none  VISION ASSESSMENT: WFL  PERCEPTION: Not tested  PRAXIS: Not  tested  OBSERVATIONS: Pt appears well-kept though keeps eyes closed off and on throughout visit. Dons glasses for near activities and doffs for the remainder of the time. Ambulates with use of rollator and has cane with him. Pt demonstrating guarding of neck while turning to sides but brings head to objects putting neck into flexion.                                                                                                                           TREATMENT :     OT educated pt on placing BLEs on footrest while completing LB dressing to reduce need for bending forward, which provokes symptoms.   OT educated on use of foam roller, deep pressure, heat, and dry needling (with PT) to hamstrings in efforts to reduce muscle spasms.   OT educated on need to gradually increase sensory load with activities, I.e. listen to band play on monitor vs playing guitar while listening to monitor. He was encouraged to avoid screen time but trial reading short stories/passages in paperback book and puzzle (50-200 pieces) completion.   OT educated pt on the importance of getting adequate, at least 8 hours of sleep. Options to optimize sleep quality and quantity provided. The importance of completing rest breaks throughout the day in a dark, quiet environment was encouraged as needed to allow body to recover from injury while reducing chance for further complications.   PATIENT EDUCATION: Education details: OT role and POC; see above Person educated: Patient and friend Education method: Explanation Education comprehension: verbalized understanding and needs further education  HOME EXERCISE PROGRAM: N/A for this visit  GOALS:  SHORT TERM GOALS: Target date: 12/05/2023    Patient will demonstrate HEP with visual handouts only for proper execution.  Baseline: Goal status: INITIAL  2.  Pt will verbalize understanding of stress reduction techniques as needed to promote needed rest following  concussion/SDH.  Baseline:  Goal status: INITIAL  3.  Pt will return demonstration of LB dressing with modifications/use of AD as needed to improve occupational performance.  Baseline:  Goal status: INITIAL   LONG TERM GOALS: Target date: 12/21/2023  Patient will report at least two-point increase in average PSFS score or at least three-point increase in  a single activity score indicating functionally significant improvement given minimum detectable change.  Baseline: 1.3 total score (See above for individual activity scores)  Goal status: INITIAL   ASSESSMENT:  CLINICAL IMPRESSION: Patient is a 68 y.o. male who was seen today for occupational therapy evaluation for subarachnoid hemorrhage. Hx includes PAD, CAD (s/p CABG in 2011), HLD, HTN, and OSA, R THA. Patient currently presents below baseline level of functioning demonstrating functional deficits and impairments as noted below. Pt would benefit from skilled OT services in the outpatient setting to work on impairments as noted below to help pt return to PLOF as able.    PERFORMANCE DEFICITS: in functional skills including ADLs, IADLs, pain, muscle spasms, body mechanics, endurance, decreased knowledge of use of DME, vision, and vestibular.   IMPAIRMENTS: are limiting patient from ADLs, IADLs, rest and sleep, work, leisure, and social participation.   CO-MORBIDITIES: may have co-morbidities  that affects occupational performance. Patient will benefit from skilled OT to address above impairments and improve overall function.  MODIFICATION OR ASSISTANCE TO COMPLETE EVALUATION: Min-Moderate modification of tasks or assist with assess necessary to complete an evaluation.  OT OCCUPATIONAL PROFILE AND HISTORY: Detailed assessment: Review of records and additional review of physical, cognitive, psychosocial history related to current functional performance.  CLINICAL DECISION MAKING: Moderate - several treatment options, min-mod task  modification necessary  REHAB POTENTIAL: Good  EVALUATION COMPLEXITY: Moderate    PLAN:  OT FREQUENCY: 1-2x/week  OT DURATION: 6 weeks  PLANNED INTERVENTIONS: 97168 OT Re-evaluation, 97535 self care/ADL training, 01027 therapeutic exercise, 97530 therapeutic activity, 97112 neuromuscular re-education, functional mobility training, visual/perceptual remediation/compensation, energy conservation, coping strategies training, patient/family education, and DME and/or AE instructions  RECOMMENDED OTHER SERVICES: N/A for this visit - pt has ST and PT referrals  CONSULTED AND AGREED WITH PLAN OF CARE: Patient and friend  PLAN FOR NEXT SESSION: scanning activities; activity modification   Delana Meyer, OT 11/07/2023, 4:20 PM

## 2023-11-08 ENCOUNTER — Ambulatory Visit (HOSPITAL_COMMUNITY)
Admission: RE | Admit: 2023-11-08 | Discharge: 2023-11-08 | Disposition: A | Discharge status: Home / Self Care | Payer: 59 | Source: Ambulatory Visit | Attending: Neurological Surgery | Admitting: Neurological Surgery

## 2023-11-08 DIAGNOSIS — S02119D Unspecified fracture of occiput, subsequent encounter for fracture with routine healing: Secondary | ICD-10-CM | POA: Diagnosis present

## 2023-11-09 ENCOUNTER — Ambulatory Visit: Payer: 59 | Admitting: Physical Therapy

## 2023-11-09 DIAGNOSIS — M79605 Pain in left leg: Secondary | ICD-10-CM

## 2023-11-09 DIAGNOSIS — R2689 Other abnormalities of gait and mobility: Secondary | ICD-10-CM

## 2023-11-09 DIAGNOSIS — R29818 Other symptoms and signs involving the nervous system: Secondary | ICD-10-CM | POA: Diagnosis not present

## 2023-11-09 DIAGNOSIS — M25551 Pain in right hip: Secondary | ICD-10-CM

## 2023-11-09 DIAGNOSIS — R2681 Unsteadiness on feet: Secondary | ICD-10-CM

## 2023-11-09 DIAGNOSIS — M6281 Muscle weakness (generalized): Secondary | ICD-10-CM

## 2023-11-09 DIAGNOSIS — R42 Dizziness and giddiness: Secondary | ICD-10-CM

## 2023-11-09 NOTE — Therapy (Signed)
 OUTPATIENT PHYSICAL THERAPY NEURO EVALUATION   Patient Name: Maurice Park MRN: [id number] DOB:[birth year], 68 y.o., male Today's Date: 11/09/2023   PCP: Maurice Raider, MD REFERRING PROVIDER: Juliet Rude, PA-C  END OF SESSION:  PT End of Session - 11/09/23 0855     Visit Number 1    Number of Visits 13   with eval   Date for PT Re-Evaluation 01/04/24    Authorization Type [employer]    PT Start Time [phone]   pt arrived late   PT Stop Time 0942    PT Time Calculation (min) 47 min    Equipment Utilized During Treatment Gait belt    Activity Tolerance Patient limited by pain   onset of R hip pain during eval   Behavior During Therapy [facility] for tasks assessed/performed   hyperverbal            Past Medical History:  Diagnosis Date   Angina    Anxiety    Back pain    CAD (coronary artery disease), with CABG [place] to LAD, Lt. radial artery to 1st diagnal, sequential SVG to PDA and PLA, 12/20/09 April 2011   Carotid artery disease (HCC)    status post right carotid endarterectomy   Chest pain    Dysrhythmia    History of heart attack    Hyperlipidemia, controlled    Hypertension    Joint pain    Lactose intolerance    Palpitations    Peripheral vascular disease (HCC)    Renal artery stenosis (HCC)    Right hip pain    Situational mixed anxiety and depressive disorder,     Sleep apnea    SOB (shortness of breath)    Past Surgical History:  Procedure Laterality Date   ABDOMINAL AORTOGRAM W/LOWER EXTREMITY N/A 03/28/2021   Procedure: ABDOMINAL AORTOGRAM W/LOWER EXTREMITY;  Surgeon: Runell Gess, MD;  Location: [facility];  Service: Cardiovascular;  Laterality: N/A;  limited runoff   ANTERIOR CRUCIATE LIGAMENT REPAIR Left    CARDIAC CATHETERIZATION  12/14/2009   multivessel CAD >> CABG (Dr. Nicki Guadalajara)   CARDIAC CATHETERIZATION  10/03/2011   L Cfx w/80% ostial stenosis, ramus with 50% segmental mid stenosis; RCA dominant & occluded in midportion; LIMA to LAD  patent; SVG to PDA/PLA patent; free L radial to diagonal functionally occluded (Dr. Erlene Quan)   CAROTID DOPPLER  11/2011   R CEA w/normal patency; left bulb w/0-49% diameter reduction   CAROTID ENDARTERECTOMY  05/03/2006   Dr. Tawanna Cooler Early   CORONARY ANGIOPLASTY  02/21/2000   PTCA with 3.25x8mm [employer] balloon of in-stent posterolateral branch restenosis, reduced from 60% to under 20% (Dr. Erlene Quan)   CORONARY ARTERY BYPASS GRAFT  12/20/2009   LIMA-LAD, left radial-1st diagonal, SVG-PDA & PLA (Dr. Kathie Rhodes. Hendrickson)   HIP ARTHROSCOPY W/ LABRAL REPAIR     LEFT HEART CATHETERIZATION WITH CORONARY/GRAFT ANGIOGRAM N/A 10/03/2011   Procedure: LEFT HEART CATHETERIZATION WITH Isabel Caprice;  Surgeon: Runell Gess, MD;  Location: [facility] CATH LAB;  Service: Cardiovascular;  Laterality: N/A;   LOWER EXTREMITY ARTERIAL DOPPLER  11/2005   normal study    NM MYOCAR IMG MI  04/2011   bruce myoview - normal perfusion; EF 58%; low risk scan   PERIPHERAL VASCULAR INTERVENTION  03/28/2021   Procedure: PERIPHERAL VASCULAR INTERVENTION;  Surgeon: Runell Gess, MD;  Location: [facility];  Service: Cardiovascular;;  Right common iliac stent   RENAL DOPPLER  12/2012  right renal stent w/1-59% diameter reduction   TRANSTHORACIC ECHOCARDIOGRAM  04/2011   EF=>55%; trace MR; mild TR   Patient Active Problem List   Diagnosis Date Noted   Traumatic subarachnoid hemorrhage (HCC) 10/24/2023   Peripheral arterial disease (HCC) 03/11/2021   Numbness and tingling of right leg 01/27/2021   Costochondritis 11/15/2020   Metabolic syndrome 11/11/2020   Prediabetes 10/18/2020   Class 1 obesity with serious comorbidity and body mass index (BMI) of 32.0 to 32.9 in adult 10/18/2020   Essential hypertension 07/02/2018   Renal artery stenosis (HCC) 07/24/2013   Carotid artery disease (HCC) 07/24/2013   Angina at rest [facility]) 10/02/2011   CAD (coronary artery disease), with CABG [place] to LAD, Lt.  radial artery to 1st diagnal, sequential SVG to PDA and PLA, 12/20/09 10/02/2011   Hyperlipidemia, controlled 10/02/2011   Situational mixed anxiety and depressive disorder,  10/02/2011    ONSET DATE: 10/26/2023 (referral date)  REFERRING DIAG: S06.6XAA (ICD-10-CM) - Traumatic subarachnoid hemorrhage with unknown loss of consciousness status, initial encounter (HCC)  THERAPY DIAG:  Muscle weakness (generalized)  Other abnormalities of gait and mobility  Unsteadiness on feet  Pain in left leg  Pain in right hip  Dizziness and giddiness  Rationale for Evaluation and Treatment: Rehabilitation  SUBJECTIVE:                                                                                                                                                                                             SUBJECTIVE STATEMENT: "Maurice Park"  Pt presents to PT evaluation after a fall on 10/24/23 backwards while playing tennis which resulted in a SDH and an occipital fracture. Pt reports that he was feeling good yesterday so he used his [facility] for community mobility rather than his rollator. Pt does feel like he may have overdone it yesterday (had his MRI and went out to eat) and he is feeling the effects today (increased muscle soreness and pain in his lower body).  Pt reports that initially after his injury he had extreme pain and spasms down into his lower body, this has improved over the past few weeks. Pt does still have ongoing spasticity in his LE which has lead to "deep aching" pain in his joints and muscles. Pt currently taking Advil for arthritis pain to treat this, is not currently taking any muscle relaxers.  Pt reports that he has always had tight hamstrings and even tore his L hamstring 5-6 years ago playing tennis. Since his injury patient has had severe pain and tightness in his L hamstrings and glute region. Pt also with a history of B hip  arthritis, tore ligaments in his R hip a few years ago and  had a surgical repair. Since his injury patient will have a burning sensation in his R hip if he walks too far.  Pt reports intermittent headaches since his injury, his last bad headache was 11/06/23 (Tues earlier this week). Pt reports no visual changes since his injury, does have ongoing dizziness if he turns his head too quickly. Pt does not turn his head or look up/down due to dizziness and is waiting on MRI results of cervical spine from yesterday. Pt also reports having motion sickness prior to this injury.  Pt reports he is trying to limit his screen time to 10-15 min at a time. Patient is working on having Dr. Yetta Barre' office complete his short-term disability paperwork as he is currently out of work. 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  Pt accompanied by: self, significant other, and friend (wife [name] and friend [name])  PERTINENT HISTORY:  PMH includes: PAD, CAD (s/p CABG in 2011), HLD, HTN, and OSA, R THA  Pt admitted to [facility] on 10/24/2023 after a ground level fall when playing tennis. Scans revealed a traumatic subarachnoid hemorrhage and an occipital fx.   PAIN:  Are you having pain? Yes: NPRS scale: 6/10 Pain location: hips/glutes down into hamstrings (L>R) Pain description: deep joint ache/soreness Aggravating factors: too much activity Relieving factors: magnesium oil, Advil for arthritis, pain meds, heat  PRECAUTIONS: Fall  RED FLAGS: None   WEIGHT BEARING RESTRICTIONS: No  FALLS: Has patient fallen in last 6 months? Yes. 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  DIAGNOSTIC FINDINGS:  Cervical MRI 11/08/23, pending results  Cervical Spine Xray 10/25/23 IMPRESSION: 1. No acute findings. 2. Mild retrolisthesis at C5-6 without dynamic instability.  Head CT 10/24/13 FINDINGS: Brain: Scattered subarachnoid hemorrhage along the cerebral convexities, left sylvian fissure, and inter peduncular cistern is diffusely and mildly increased. Some of this change is related to redistribution with left subarachnoid blood seen along the inferior frontal lobes. Suspect mild inferior frontal lobe hemorrhage and edema from contusion, hemorrhage on the left measuring up to 5 mm. The pattern is compatible with history of head trauma. No evidence of infarct, hydrocephalus, or mass   Vascular: No hyperdense vessel or unexpected calcification.   Skull: Vertical, nondisplaced right occipital bone fracture, known.   Sinuses/Orbits: Generalized mucosal thickening in the ethmoid, sphenoid, and frontal sinuses.   IMPRESSION: 1. Scattered subarachnoid hemorrhage with mild increase/redistribution from yesterday. 2. Possible small inferior frontal lobe contusions. 3. Known occipital bone fracture that is nondepressed.  Head CT 10/30/2023 IMPRESSION: 1. Unchanged appearance of small anterior frontal lobe hemorrhagic contusions. 2. Unchanged appearance of nondisplaced right occipital fracture.  COGNITION: Overall cognitive status: Within functional limits for tasks assessed   SENSATION: WFL  POSTURE: posterior pelvic tilt  LOWER EXTREMITY ROM:     Passive  Right  Eval Left Eval  Hip flexion    Hip extension    Hip abduction    Hip adduction    Hip internal rotation    Hip external  rotation    Knee flexion    Knee extension Tight HS Tight HS  Ankle dorsiflexion    Ankle plantarflexion    Ankle inversion    Ankle eversion     (Blank rows = not tested)  LOWER EXTREMITY MMT:    MMT Right Eval Left Eval  Hip flexion 5 5  Hip extension    Hip abduction    Hip adduction    Hip internal rotation    Hip external rotation    Knee flexion 5 5  Knee extension 5 5  Ankle dorsiflexion 5 5  Ankle plantarflexion    Ankle inversion    Ankle eversion    (Blank rows = not tested)  BED MOBILITY:  Slow process per patient report, has to orient himself  TRANSFERS: Assistive device utilized: None  Sit to stand: Modified independence (increased time) Stand to sit: Modified independence Chair to chair: Modified independence Floor:  not assessed at eval   STAIRS: Level of Assistance: Modified independence Stair Negotiation Technique: Alternating Pattern  with Bilateral Rails Number of Stairs: 4  Height of Stairs: 6  Comments: WFL  GAIT: Gait pattern: decreased hip/knee flexion- Right, decreased hip/knee flexion- Left, antalgic, and trunk flexed Distance walked: various clinic distances Assistive device utilized: None Level of assistance: CGA Comments: with onset of R hip pain exhibits increase in antalgic gait pattern  FUNCTIONAL TESTS:    [facility] PT Assessment - 11/09/23 0929       Ambulation/Gait   Gait velocity 32.8 ft over 13.37 sec = 2.45 ft/sec      Standardized Balance Assessment   Standardized Balance Assessment Timed Up and Go Test;Five Times Sit to Stand    Five times sit to stand comments  20 sec   no UE     Timed Up and Go Test   TUG Normal TUG    Normal TUG (seconds) 14   no AD     Functional Gait  Assessment   Gait assessed  Yes    Gait Level Surface Walks 20 ft, slow speed, abnormal gait pattern, evidence for imbalance or deviates 10-15 in outside of the 12 in walkway width. Requires more than 7 sec to ambulate 20 ft.    Change in Gait  Speed Makes only minor adjustments to walking speed, or accomplishes a change in speed with significant gait deviations, deviates 10-15 in outside the 12 in walkway width, or changes speed but loses balance but is able to recover and continue walking.    Gait with Horizontal Head Turns Performs head turns with moderate changes in gait velocity, slows down, deviates 10-15 in outside 12 in walkway width but recovers, can continue to walk.    Gait with Vertical Head Turns Performs task with severe disruption of gait (eg, staggers 15 in outside 12 in walkway width, loses balance, stops, reaches for wall).    Gait and Pivot Turn Turns slowly, requires verbal cueing, or requires several small steps to catch balance following turn and stop    Step Over Obstacle Cannot perform without assistance.    Gait with Narrow Base of Support Is able to ambulate for 10 steps heel to toe with no staggering.    Gait with Eyes Closed Walks 20 ft, uses assistive device, slower speed, mild gait deviations, deviates  6-10 in outside 12 in walkway width. Ambulates 20 ft in less than 9 sec but greater than 7 sec.    Ambulating Backwards Walks 20 ft, uses assistive device, slower speed, mild gait deviations, deviates 6-10 in outside 12 in walkway width.    Steps Alternating feet, must use rail.    Total Score 12    FGA comment: 12/30                                                                                                                                         TREATMENT DATE: PT Evaluation    PATIENT EDUCATION: Education details: Eval findings, PT POC, results of OM and functional implications Person educated: Patient, Spouse, and friend Kim Education method: Explanation Education comprehension: verbalized understanding and needs further education  HOME EXERCISE PROGRAM: To be initiated Encouraged pt to work on L hamstring stretches but can review further in future sessions  GOALS: Goals reviewed with  patient? Yes  SHORT TERM GOALS: Target date: 11/30/2023  Pt will be independent with initial HEP for improved strength, balance, transfers and gait and management of his pain symptoms. Baseline: Goal status: INITIAL  2.  Pt will improve gait velocity to at least 2.75 ft/sec for improved gait efficiency and performance at SBA level  Baseline: 2.45 ft/sec no AD (2/28) Goal status: INITIAL  3.  Pt will improve 5 x STS to less than or equal to 17 seconds to demonstrate improved functional strength and transfer efficiency.  Baseline: 20 sec no UE (2/28) Goal status: INITIAL  4.  Pt will improve FGA to 16/30 for decreased fall risk  Baseline: 12/30 (2/28) Goal status: INITIAL  5.  Vestibular system to be assessed and LTG set as appropriate Baseline:  Goal status: INITIAL   LONG TERM GOALS: Target date: 12/21/2023   Pt will be independent with final HEP for improved strength, balance, transfers and gait and management of his pain symptoms. Baseline:  Goal status: INITIAL  2.  Pt will improve gait velocity to at least 3.0 ft/sec for improved gait efficiency and performance at mod I level  Baseline: 2.45 ft/sec no AD (2/28) Goal status: INITIAL  3.  Pt will improve 5 x STS to less than or equal to 14 seconds to demonstrate improved functional strength and transfer efficiency.  Baseline: 20 sec no UE (2/28) Goal status: INITIAL  4.  Pt will improve FGA to 20/30 for decreased fall risk  Baseline: 12/30 (2/28) Goal status: INITIAL  5.  Vestibular system to be assessed and LTG set as appropriate Baseline:  Goal status: INITIAL   ASSESSMENT:  CLINICAL IMPRESSION: Patient is a 68 year old male referred to Neuro OPPT for SAH and occipital fracture after a fall.   Pt's PMH is significant for: PAD, CAD (s/p CABG in 2011), HLD, HTN, and OSA, R THA. The following deficits were present during the exam:  decreased functional LE strength, decreased LE ROM due to pain and muscle tightness,  impaired balance, decreased activity tolerance, and dizziness. Based on his gait speed of 2.45 ft/sec, TUG score of 14 sec, 5xSTS score of 20 sec, FGA score of 12/30, and fall history, pt is an increased risk for falls. Pt would benefit from skilled PT to address these impairments and functional limitations to maximize functional mobility independence.   OBJECTIVE IMPAIRMENTS: Abnormal gait, decreased activity tolerance, decreased knowledge of condition, decreased mobility, difficulty walking, decreased ROM, decreased strength, dizziness, impaired perceived functional ability, increased muscle spasms, impaired vision/preception, and pain.   ACTIVITY LIMITATIONS: carrying, lifting, bending, standing, stairs, transfers, and bed mobility  PARTICIPATION LIMITATIONS: meal prep, cleaning, interpersonal relationship, driving, occupation, and yard work  PERSONAL FACTORS: 3+ comorbidities:   PAD, CAD (s/p CABG in 2011), HLD, HTN, and OSA, R THAare also affecting patient's functional outcome.   REHAB POTENTIAL: Good  CLINICAL DECISION MAKING: Evolving/moderate complexity  EVALUATION COMPLEXITY: High  PLAN:  PT FREQUENCY: 1-2x/week  PT DURATION: 6 weeks  PLANNED INTERVENTIONS: 97164- PT Re-evaluation, 97110-Therapeutic exercises, 97530- Therapeutic activity, 97112- Neuromuscular re-education, 97535- Self Care, [id number]- Manual therapy, [phone]- Gait training, [phone]- Electrical stimulation (manual), Patient/Family education, Balance training, Stair training, Taping, Dry Needling, Joint mobilization, Spinal mobilization, Vestibular training, Visual/preceptual remediation/compensation, DME instructions, Cryotherapy, and Moist heat  PLAN FOR NEXT SESSION: results of cervical MRI and assess cervical ROM?, address tightness in L hamstrings and glutes (stretching, TPDN); assess R hip strength/ROM and pain; change some visits to [facility] for vestibular/TBI; initiate HEP to address L HS and glute tightness, R hip  weakness, balance impairments based on FGA (head turns, pivots/turns, stepping over obstacles)   Peter Congo, PT Peter Congo, PT, DPT, CSRS  11/09/2023, 11:16 AM

## 2023-11-12 ENCOUNTER — Ambulatory Visit: Payer: 59 | Admitting: Physical Therapy

## 2023-11-12 ENCOUNTER — Ambulatory Visit: Payer: 59 | Attending: Physician Assistant | Admitting: Occupational Therapy

## 2023-11-12 DIAGNOSIS — R6889 Other general symptoms and signs: Secondary | ICD-10-CM | POA: Insufficient documentation

## 2023-11-12 DIAGNOSIS — R29818 Other symptoms and signs involving the nervous system: Secondary | ICD-10-CM | POA: Insufficient documentation

## 2023-11-12 DIAGNOSIS — M25551 Pain in right hip: Secondary | ICD-10-CM

## 2023-11-12 DIAGNOSIS — M6281 Muscle weakness (generalized): Secondary | ICD-10-CM | POA: Insufficient documentation

## 2023-11-12 DIAGNOSIS — R2689 Other abnormalities of gait and mobility: Secondary | ICD-10-CM | POA: Diagnosis present

## 2023-11-12 DIAGNOSIS — R42 Dizziness and giddiness: Secondary | ICD-10-CM | POA: Insufficient documentation

## 2023-11-12 DIAGNOSIS — M79605 Pain in left leg: Secondary | ICD-10-CM | POA: Diagnosis present

## 2023-11-12 DIAGNOSIS — R41841 Cognitive communication deficit: Secondary | ICD-10-CM | POA: Diagnosis present

## 2023-11-12 DIAGNOSIS — R2681 Unsteadiness on feet: Secondary | ICD-10-CM | POA: Insufficient documentation

## 2023-11-12 DIAGNOSIS — S066XAA Traumatic subarachnoid hemorrhage with loss of consciousness status unknown, initial encounter: Secondary | ICD-10-CM | POA: Diagnosis present

## 2023-11-12 DIAGNOSIS — R41842 Visuospatial deficit: Secondary | ICD-10-CM | POA: Insufficient documentation

## 2023-11-12 NOTE — Therapy (Signed)
 OUTPATIENT PHYSICAL THERAPY NEURO TREATMENT   Patient Name: Maurice Park MRN: [id number] DOB:03-Jul-1956, 68 y.o., male Today's Date: 11/12/2023   PCP: Lupita Raider, MD REFERRING PROVIDER: Juliet Rude, PA-C  END OF SESSION:  PT End of Session - 11/12/23 1233     Visit Number 2    Number of Visits 13   with eval   Date for PT Re-Evaluation 01/04/24    Authorization Type [employer]    PT Start Time 1234   pt in restroom   PT Stop Time 1315    PT Time Calculation (min) 41 min    Equipment Utilized During Treatment --    Activity Tolerance Patient limited by pain   onset of R hip pain   Behavior During Therapy [facility] for tasks assessed/performed   hyperverbal             Past Medical History:  Diagnosis Date   Angina    Anxiety    Back pain    CAD (coronary artery disease), with CABG [place] to LAD, Lt. radial artery to 1st diagnal, sequential SVG to PDA and PLA, 12/20/09 April 2011   Carotid artery disease (HCC)    status post right carotid endarterectomy   Chest pain    Dysrhythmia    History of heart attack    Hyperlipidemia, controlled    Hypertension    Joint pain    Lactose intolerance    Palpitations    Peripheral vascular disease (HCC)    Renal artery stenosis (HCC)    Right hip pain    Situational mixed anxiety and depressive disorder,     Sleep apnea    SOB (shortness of breath)    Past Surgical History:  Procedure Laterality Date   ABDOMINAL AORTOGRAM W/LOWER EXTREMITY N/A 03/28/2021   Procedure: ABDOMINAL AORTOGRAM W/LOWER EXTREMITY;  Surgeon: Runell Gess, MD;  Location: [facility];  Service: Cardiovascular;  Laterality: N/A;  limited runoff   ANTERIOR CRUCIATE LIGAMENT REPAIR Left    CARDIAC CATHETERIZATION  12/14/2009   multivessel CAD >> CABG (Dr. Nicki Guadalajara)   CARDIAC CATHETERIZATION  10/03/2011   L Cfx w/80% ostial stenosis, ramus with 50% segmental mid stenosis; RCA dominant & occluded in midportion; LIMA to LAD patent; SVG to  PDA/PLA patent; free L radial to diagonal functionally occluded (Dr. Erlene Quan)   CAROTID DOPPLER  11/2011   R CEA w/normal patency; left bulb w/0-49% diameter reduction   CAROTID ENDARTERECTOMY  05/03/2006   Dr. Tawanna Cooler Early   CORONARY ANGIOPLASTY  02/21/2000   PTCA with 3.25x18mm [employer] Monor[employer] balloon of in-stent posterolateral branch restenosis, reduced from 60% to under 20% (Dr. Erlene Quan)   CORONARY ARTERY BYPASS GRAFT  12/20/2009   LIMA-LAD, left radial-1st diagonal, SVG-PDA & PLA (Dr. Kathie Rhodes. Hendrickson)   HIP ARTHROSCOPY W/ LABRAL REPAIR     LEFT HEART CATHETERIZATION WITH CORONARY/GRAFT ANGIOGRAM N/A 10/03/2011   Procedure: LEFT HEART CATHETERIZATION WITH Isabel Caprice;  Surgeon: Runell Gess, MD;  Location: [facility] CATH LAB;  Service: Cardiovascular;  Laterality: N/A;   LOWER EXTREMITY ARTERIAL DOPPLER  11/2005   normal study    NM MYOCAR IMG MI  04/2011   bruce myoview - normal perfusion; EF 58%; low risk scan   PERIPHERAL VASCULAR INTERVENTION  03/28/2021   Procedure: PERIPHERAL VASCULAR INTERVENTION;  Surgeon: Runell Gess, MD;  Location: [facility];  Service: Cardiovascular;;  Right common iliac stent   RENAL DOPPLER  12/2012   right renal  stent w/1-59% diameter reduction   TRANSTHORACIC ECHOCARDIOGRAM  04/2011   EF=>55%; trace MR; mild TR   Patient Active Problem List   Diagnosis Date Noted   Traumatic subarachnoid hemorrhage (HCC) 10/24/2023   Peripheral arterial disease (HCC) 03/11/2021   Numbness and tingling of right leg 01/27/2021   Costochondritis 11/15/2020   Metabolic syndrome 11/11/2020   Prediabetes 10/18/2020   Class 1 obesity with serious comorbidity and body mass index (BMI) of 32.0 to 32.9 in adult 10/18/2020   Essential hypertension 07/02/2018   Renal artery stenosis (HCC) 07/24/2013   Carotid artery disease (HCC) 07/24/2013   Angina at rest [facility]) 10/02/2011   CAD (coronary artery disease), with CABG [place] to LAD, Lt. radial artery to  1st diagnal, sequential SVG to PDA and PLA, 12/20/09 10/02/2011   Hyperlipidemia, controlled 10/02/2011   Situational mixed anxiety and depressive disorder,  10/02/2011    ONSET DATE: 10/26/2023 (referral date)  REFERRING DIAG: S06.6XAA (ICD-10-CM) - Traumatic subarachnoid hemorrhage with unknown loss of consciousness status, initial encounter (HCC)  THERAPY DIAG:  Muscle weakness (generalized)  Other abnormalities of gait and mobility  Unsteadiness on feet  Pain in left leg  Pain in right hip  Rationale for Evaluation and Treatment: Rehabilitation  SUBJECTIVE:                                                                                                                                                                                             SUBJECTIVE STATEMENT: "Maurice Park"  Pt reports that he had a bad headache yesterday, it was not relieved until he went to bed. Pt did get a prescription for a new headache medication which has been helpful. Pt with ongoing soreness in his L hip and down his left leg, only one small spasm since last visit. He has been working on L HS stretch with resistance bands. Pt reports that his R hip continues to burn when walking more than a few minutes. Pt wonders if he injured R hip with his fall or if it is what caused his fall, he did not have any imaging performed after his injury.  Pt worked with Dr. Madelon Lips at [facility] for his previous R hip injury.   Pt accompanied by: self and friend (friend [name])  PERTINENT HISTORY:  PMH includes: PAD, CAD (s/p CABG in 2011), HLD, HTN, and OSA, R THA  Pt [facility] to [facility] on 10/24/2023 after a ground level fall when playing tennis. Scans revealed a traumatic subarachnoid hemorrhage and an occipital fx.   PAIN:  Are you having pain? Yes: NPRS scale: 3/10 Pain location: hips/glutes down into hamstrings (  L>R) Pain description: deep joint ache/soreness Aggravating factors: too much activity Relieving  factors: magnesium oil, Advil for arthritis, pain meds, heat  PRECAUTIONS: Fall  RED FLAGS: None   WEIGHT BEARING RESTRICTIONS: No  FALLS: Has patient fallen in last 6 months? Yes. 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  DIAGNOSTIC FINDINGS:  Cervical MRI 11/08/23, pending results  Cervical Spine Xray 10/25/23 IMPRESSION: 1. No acute findings. 2. Mild retrolisthesis at C5-6 without dynamic instability.  Head CT 10/24/13 FINDINGS: Brain: Scattered subarachnoid hemorrhage along the cerebral convexities, left sylvian fissure, and inter peduncular cistern is diffusely and mildly increased. Some of this change is related to redistribution with left subarachnoid blood seen along the inferior frontal lobes. Suspect mild inferior frontal lobe hemorrhage and edema from contusion, hemorrhage on the left measuring up to 5 mm. The pattern is compatible with history of head trauma. No evidence of infarct, hydrocephalus, or mass   Vascular: No hyperdense vessel or unexpected calcification.   Skull: Vertical, nondisplaced right occipital bone fracture, known.   Sinuses/Orbits: Generalized mucosal thickening in the ethmoid, sphenoid, and frontal sinuses.   IMPRESSION: 1. Scattered subarachnoid hemorrhage with mild increase/redistribution from  yesterday. 2. Possible small inferior frontal lobe contusions. 3. Known occipital bone fracture that is nondepressed.  Head CT 10/30/2023 IMPRESSION: 1. Unchanged appearance of small anterior frontal lobe hemorrhagic contusions. 2. Unchanged appearance of nondisplaced right occipital fracture.  COGNITION: Overall cognitive status: Within functional limits for tasks assessed   SENSATION: WFL  POSTURE: posterior pelvic tilt  LOWER EXTREMITY ROM:     Passive  Right Eval Left Eval  Hip flexion    Hip extension    Hip abduction    Hip adduction    Hip internal rotation    Hip external rotation    Knee flexion    Knee extension Tight HS Tight HS  Ankle dorsiflexion    Ankle plantarflexion    Ankle inversion    Ankle eversion     (Blank rows = not tested)  LOWER EXTREMITY MMT:    MMT Right Eval Left Eval  Hip flexion 5 5  Hip extension    Hip abduction    Hip adduction    Hip internal rotation    Hip external rotation    Knee flexion 5 5  Knee extension 5 5  Ankle dorsiflexion 5 5  Ankle plantarflexion    Ankle inversion    Ankle eversion    (Blank rows = not tested)  BED MOBILITY:  Slow process per patient report, has to orient himself  TRANSFERS: Assistive device utilized: None  Sit to stand: Modified independence (increased time) Stand to sit: Modified independence Chair to chair: Modified independence Floor:  not assessed at eval   STAIRS: Level of Assistance: Modified independence Stair Negotiation Technique: Alternating Pattern  with Bilateral Rails Number of Stairs: 4  Height of Stairs: 6  Comments: [facility]  GAIT: Gait pattern: decreased hip/knee flexion- Right, decreased hip/knee flexion- Left, antalgic, and trunk flexed Distance walked: various clinic distances Assistive device utilized: None Level of assistance: CGA Comments: with onset of R hip pain exhibits increase in antalgic gait pattern                                                                                                                                 TREATMENT:  TherAct Trigger Point Dry Needling  Initial Treatment: Pt instructed on Dry Needling rational, procedures, and possible side effects. Pt instructed to expect mild to moderate muscle soreness later in the day and/or into the next day.  Pt instructed in methods to reduce muscle soreness. Pt instructed to continue prescribed HEP. Patient was educated on signs and symptoms of infection and other risk factors and advised to seek medical attention should they occur.  Patient verbalized understanding of these instructions and education.   Patient Verbal Consent Given: Yes Education Handout Provided: Yes Muscles Treated: L hamstrings Electrical Stimulation Performed: No Treatment Response/Outcome: deep ache/pressure; burning in quads; relief of muscle tension; muscle twitch detected  Trigger Point Dry Needling  What is Trigger Point Dry Needling (DN)? DN is a physical therapy technique used to treat muscle pain and dysfunction. Specifically, DN helps deactivate muscle trigger points (muscle knots).  A thin filiform needle is used to penetrate the skin and stimulate the underlying trigger point. The goal is for a local twitch response (LTR) to occur and for the trigger point to relax. No medication of any kind is injected during the procedure.   What Does Trigger Point Dry Needling Feel Like?  The procedure feels different for each individual patient. Some patients report that they do not actually feel the needle enter the skin and overall the process is not painful. Very mild bleeding may occur. However, many patients feel a deep cramping in the muscle in which the needle was inserted. This is the local twitch response.   How Will I feel after the treatment? Soreness is normal, and the onset of soreness may not occur for a few hours. Typically this soreness does not last longer than two days.  Bruising  is uncommon, however; ice can be used to decrease any possible bruising.  In rare cases feeling tired or nauseous after the treatment is normal. In addition, your symptoms may get worse before they get better, this period will typically not last longer than 24 hours.   What Can I do After My Treatment? Increase your hydration by drinking more water for the next 24 hours.  You may place ice or heat on the areas treated that have become sore, however, do not use heat on inflamed or bruised areas. Heat often brings more relief post needling. You can continue your regular activities, but vigorous activity is not recommended initially after the treatment for 24 hours. DN is best combined with other physical therapy such as strengthening,  stretching, and other therapies.   What are the complications? While your therapist has had extensive training in minimizing the risks of trigger point dry needling, it is important to understand the risks of any procedure.  Risks include bleeding, pain, fatigue, hematoma, infection, vertigo, nausea or nerve involvement. Monitor for any changes to your skin or sensation. Contact your therapist or MD with concerns.  A rare but serious complication is a pneumothorax over or near your middle and upper chest and back If you have dry needling in this area, monitor for the following symptoms: Shortness of breath on exertion and/or Difficulty taking a deep breath and/or Chest Pain and/or A dry cough If any of the above symptoms develop, please go to the nearest emergency room or call 911. Tell them you had dry needling over your thorax and report any symptoms you are having. Please follow-up with your treating therapist after you complete the medical evaluation.    LOWER EXTREMITY SPECIAL TESTS:  Hip special tests: Luisa Hart (FABER) test: negative and FADIR test: positive and distraction test: positive     Pt exhibits no R hip pain with compression, does have pain at  end-range ER with PROM, pain with FADIR but not FABER. Pt does have relief of symptoms with R hip distraction.  Pt noted to have weakness in his R hip abductors (3/5) and tightness in his R IT band. Pt also with onset of burning sensation in either his R hip joint or his R hip muscles after gait x 230 ft no AD.   TherEx Sidelying therex to address R hip abd weakness: Clamshells x 10 reps Hip abd diagonal x 5 reps  Added to HEP along with foam-rolling of glutes and R HS to HEP, see bolded below   PATIENT EDUCATION: Education details: initiated HEP, TPDN (see above), results of assessments this date Person educated: Patient and friend Kim Education method: Explanation, Demonstration, and Handouts Education comprehension: verbalized understanding, returned demonstration, and needs further education  HOME EXERCISE PROGRAM: Access Code: [email or web address] URL: [email or web address] Date: 11/12/2023 Prepared by: Peter Congo  Exercises - Seated Hamstring Stretch with Strap  - 1 x daily - 7 x weekly - 1 sets - 3-5 reps - 30-60 hold - Hamstring Mobilization with Foam Roll  - 1-2 x daily - 7 x weekly - 1 sets - 10 reps - Gluteus Mobilization with Foam Roll  - 1-2 x daily - 7 x weekly - 1 sets - 10 reps - Clamshell  - 1 x daily - 7 x weekly - 3 sets - 5-10 reps - Sidelying Diagonal Hip Abduction  - 1 x daily - 7 x weekly - 3 sets - 5 reps  GOALS: Goals reviewed with patient? Yes  SHORT TERM GOALS: Target date: 11/30/2023  Pt will be independent with initial HEP for improved strength, balance, transfers and gait and management of his pain symptoms. Baseline: Goal status: INITIAL  2.  Pt will improve gait velocity to at least 2.75 ft/sec for improved gait efficiency and performance at SBA level  Baseline: 2.45 ft/sec no AD (2/28) Goal status: INITIAL  3.  Pt will improve 5 x STS to less than or equal to 17 seconds to demonstrate improved functional strength and transfer  efficiency.  Baseline: 20 sec no UE (2/28) Goal status: INITIAL  4.  Pt will improve FGA to 16/30 for decreased fall risk  Baseline: 12/30 (2/28) Goal status: INITIAL  5.  Vestibular system to be assessed and LTG set as appropriate  Baseline:  Goal status: INITIAL   LONG TERM GOALS: Target date: 12/21/2023   Pt will be independent with final HEP for improved strength, balance, transfers and gait and management of his pain symptoms. Baseline:  Goal status: INITIAL  2.  Pt will improve gait velocity to at least 3.0 ft/sec for improved gait efficiency and performance at mod I level  Baseline: 2.45 ft/sec no AD (2/28) Goal status: INITIAL  3.  Pt will improve 5 x STS to less than or equal to 14 seconds to demonstrate improved functional strength and transfer efficiency.  Baseline: 20 sec no UE (2/28) Goal status: INITIAL  4.  Pt will improve FGA to 20/30 for decreased fall risk  Baseline: 12/30 (2/28) Goal status: INITIAL  5.  Vestibular system to be assessed and LTG set as appropriate Baseline:  Goal status: INITIAL   ASSESSMENT:  CLINICAL IMPRESSION: Emphasis of skilled PT session on initiating TPDN to treat tight muscles/trigger points in left hamstrings as well as further assessing R hip due to pt complaints of burning sensation in joint and R hip muscles after walking short distances. Pt with good response to DN in his L HS, decreased tightness noted following treatment. Pt noted to have pain with end-range R hip ER as well as with FADIR and relief of pain with joint distraction. Pt also noted to have weakness in his R hip abd, added strengthening exercises to his HEP. Pt continues to benefit from skilled PT services to work towards decreasing R hip pain, increasing R hip strength, decreasing L HS pain and tightness, improving his balance and functional mobility, and addressing his symptoms of dizziness with positional changes. Continue POC.    OBJECTIVE IMPAIRMENTS:  Abnormal gait, decreased activity tolerance, decreased knowledge of condition, decreased mobility, difficulty walking, decreased ROM, decreased strength, dizziness, impaired perceived functional ability, increased muscle spasms, impaired vision/preception, and pain.   ACTIVITY LIMITATIONS: carrying, lifting, bending, standing, stairs, transfers, and bed mobility  PARTICIPATION LIMITATIONS: meal prep, cleaning, interpersonal relationship, driving, occupation, and yard work  PERSONAL FACTORS: 3+ comorbidities:   PAD, CAD (s/p CABG in 2011), HLD, HTN, and OSA, R THAare also affecting patient's functional outcome.   REHAB POTENTIAL: Good  CLINICAL DECISION MAKING: Evolving/moderate complexity  EVALUATION COMPLEXITY: High  PLAN:  PT FREQUENCY: 1-2x/week  PT DURATION: 6 weeks  PLANNED INTERVENTIONS: 97164- PT Re-evaluation, 97110-Therapeutic exercises, 97530- Therapeutic activity, 97112- Neuromuscular re-education, 97535- Self Care, 16109- Manual therapy, [phone]- Gait training, [phone]- Electrical stimulation (manual), Patient/Family education, Balance training, Stair training, Taping, Dry Needling, Joint mobilization, Spinal mobilization, Vestibular training, Visual/preceptual remediation/compensation, DME instructions, Cryotherapy, and Moist heat  PLAN FOR NEXT SESSION: results of cervical MRI and assess cervical ROM?, address tightness in L hamstrings and glutes (stretching, TPDN); assess R hip strength/ROM and pain; change some visits to [facility] for vestibular/TBI; add to HEP to address L HS and glute tightness, R hip weakness, balance impairments based on FGA (head turns, pivots/turns, stepping over obstacles); lateral sidestepping, monster walks, SKTC?, piriformis stretch?, change some visits to [facility]qu[facility] Bath, PT Peter Congo, PT, DPT, CSRS  11/12/2023, 1:17 PM

## 2023-11-12 NOTE — Patient Instructions (Signed)
Activities to try at home to encourage visual scanning:   1. Word searches 2. Mazes 3. Puzzles 4. Card games 5. Computer games and/or searches 6. Connect-the-dots  Activities for environmental (larger) scanning:  1. With supervision, scan for items in grocery store or drugstore.  Begin with a familiar store, then progress to a new store you've never been in before. Make sure you have supervision with this.  2. With supervision, tell a family member or caregiver when it is safe to cross a street after looking all directions and any side streets. However, do NOT cross street unless family member or caregiver is with you and says it is OK 

## 2023-11-12 NOTE — Therapy (Unsigned)
 OUTPATIENT OCCUPATIONAL THERAPY NEURO TREATMENT  Patient Name: Maurice Park MRN: [id number] DOB:June 14, 1956, 68 y.o., male Today's Date: 11/12/2023  PCP: Lupita Raider, MD  REFERRING PROVIDER: Juliet Rude, PA-C  END OF SESSION:  OT End of Session - 11/12/23 1149     Visit Number 2    Number of Visits 13    Date for OT Re-Evaluation 12/21/23    Authorization Type UHC    OT Start Time 1149    OT Stop Time 1229    OT Time Calculation (min) 40 min    Activity Tolerance Patient tolerated treatment well    Behavior During Therapy WFL for tasks assessed/performed            Past Medical History:  Diagnosis Date   Angina    Anxiety    Back pain    CAD (coronary artery disease), with CABG Lima to LAD, Lt. radial artery to 1st diagnal, sequential SVG to PDA and PLA, 12/20/09 April 2011   Carotid artery disease (HCC)    status post right carotid endarterectomy   Chest pain    Dysrhythmia    History of heart attack    Hyperlipidemia, controlled    Hypertension    Joint pain    Lactose intolerance    Palpitations    Peripheral vascular disease (HCC)    Renal artery stenosis (HCC)    Right hip pain    Situational mixed anxiety and depressive disorder,     Sleep apnea    SOB (shortness of breath)    Past Surgical History:  Procedure Laterality Date   ABDOMINAL AORTOGRAM W/LOWER EXTREMITY N/A 03/28/2021   Procedure: ABDOMINAL AORTOGRAM W/LOWER EXTREMITY;  Surgeon: Runell Gess, MD;  Location: [facility];  Service: Cardiovascular;  Laterality: N/A;  limited runoff   ANTERIOR CRUCIATE LIGAMENT REPAIR Left    CARDIAC CATHETERIZATION  12/14/2009   multivessel CAD >> CABG (Dr. Nicki Guadalajara)   CARDIAC CATHETERIZATION  10/03/2011   L Cfx w/80% ostial stenosis, ramus with 50% segmental mid stenosis; RCA dominant & occluded in midportion; LIMA to LAD patent; SVG to PDA/PLA patent; free L radial to diagonal functionally occluded (Dr. Erlene Quan)   CAROTID DOPPLER   11/2011   R CEA w/normal patency; left bulb w/0-49% diameter reduction   CAROTID ENDARTERECTOMY  05/03/2006   Dr. Tawanna Cooler Early   CORONARY ANGIOPLASTY  02/21/2000   PTCA with 3.25x13mm Quantum Monorail balloon of in-stent posterolateral branch restenosis, reduced from 60% to under 20% (Dr. Erlene Quan)   CORONARY ARTERY BYPASS GRAFT  12/20/2009   LIMA-LAD, left radial-1st diagonal, SVG-PDA & PLA (Dr. Kathie Rhodes. Hendrickson)   HIP ARTHROSCOPY W/ LABRAL REPAIR     LEFT HEART CATHETERIZATION WITH CORONARY/GRAFT ANGIOGRAM N/A 10/03/2011   Procedure: LEFT HEART CATHETERIZATION WITH Isabel Caprice;  Surgeon: Runell Gess, MD;  Location: [facility] CATH LAB;  Service: Cardiovascular;  Laterality: N/A;   LOWER EXTREMITY ARTERIAL DOPPLER  11/2005   normal study    NM MYOCAR IMG MI  04/2011   bruce myoview - normal perfusion; EF 58%; low risk scan   PERIPHERAL VASCULAR INTERVENTION  03/28/2021   Procedure: PERIPHERAL VASCULAR INTERVENTION;  Surgeon: Runell Gess, MD;  Location: [facility];  Service: Cardiovascular;;  Right common iliac stent   RENAL DOPPLER  12/2012   right renal stent w/1-59% diameter reduction   TRANSTHORACIC ECHOCARDIOGRAM  04/2011   EF=>55%; trace MR; mild TR   Patient Active Problem List   Diagnosis  Date Noted   Traumatic subarachnoid hemorrhage (HCC) 10/24/2023   Peripheral arterial disease (HCC) 03/11/2021   Numbness and tingling of right leg 01/27/2021   Costochondritis 11/15/2020   Metabolic syndrome 11/11/2020   Prediabetes 10/18/2020   Class 1 obesity with serious comorbidity and body mass index (BMI) of 32.0 to 32.9 in adult 10/18/2020   Essential hypertension 07/02/2018   Renal artery stenosis (HCC) 07/24/2013   Carotid artery disease (HCC) 07/24/2013   Angina at rest [facility]) 10/02/2011   CAD (coronary artery disease), with CABG Lima to LAD, Lt. radial artery to 1st diagnal, sequential SVG to PDA and PLA, 12/20/09 10/02/2011   Hyperlipidemia, controlled  10/02/2011   Situational mixed anxiety and depressive disorder,  10/02/2011   ONSET DATE: 10/26/2023 (Date of referral)  REFERRING DIAG: S06.6XAA (ICD-10-CM) - Traumatic subarachnoid hemorrhage with unknown loss of consciousness status, initial encounter   THERAPY DIAG:  Muscle weakness (generalized)  Other symptoms and signs involving the nervous system  Rationale for Evaluation and Treatment: Rehabilitation  SUBJECTIVE:   SUBJECTIVE STATEMENT: He had a really bad headache last night. He took his Amitriptyline, which seemed to make it better.   He was able to sleep 8 hours vs his usual 6.   He was able to play his guitar this weekend.   Pt accompanied by: friend - Kim, also OT  PERTINENT HISTORY: PMH includes: PAD, CAD (s/p CABG in 2011), HLD, HTN, and OSA, R THA   Pt admitted to [facility] on 10/24/2023 after a ground level fall when playing tennis. Scans revealed a traumatic subarachnoid hemorrhage and an occipital fx   PRECAUTIONS: Fall and Other: concussion  WEIGHT BEARING RESTRICTIONS: No  PAIN:  Are you having pain? Yes: NPRS scale: 2/10 Pain location: LLE Pain description: sore Aggravating factors: fatigue Relieving factors: rest, medications  FALLS: Has patient fallen in last 6 months? Yes. Number of falls 1 - fell when playing tennis, resulting in [facility]  LIVING ENVIRONMENT: Family/patient expects to be discharged to:: Private residence Living Arrangements: Spouse/significant other Available Help at Discharge: Family Type of Home: House Home Access: Other (comment) (2 steps in house, no railings to support) Home Layout: One level Bathroom Shower/Tub: Health visitor: Handicapped height Bathroom Accessibility: Yes Home Equipment: Crutches;Shower seat - built in;Grab bars - tub/shower Additional Comments: dog that is blind 2 yo  Lives With: Spouse    PLOF: Independent; was driving; Electronics engineer with lots of computer, phone, and  video chats  PATIENT GOALS: return to playing guitar and outdoor work  OBJECTIVE:  Note: Objective measures were completed at Evaluation unless otherwise noted.  HAND DOMINANCE: Right  ADLs: Eating: is cautious around sharp items such as a knife Grooming: unable to bend forward to spit into sink UB Dressing: I LB Dressing: requires help to don shoes and socks as reaching provokes symptoms Toileting: mod I Bathing: mod I Equipment: Grab bars, Walk in shower, Reacher, and HHSH  IADLs: Shopping: has not tried Light housekeeping: light housekeeping only Meal Prep: dependent Community mobility: dependent Medication management: mod I Financial management: mod I Handwriting:  no change  MOBILITY STATUS: Needs Assist: Rollator for long distances, cane in home  ACTIVITY TOLERANCE: Activity tolerance: poor  FUNCTIONAL OUTCOME MEASURES: PSFS: 1.3 total score   Total score = sum of the activity scores/number of activities Minimum detectable change (90%CI) for average score = 2 points Minimum detectable change (90%CI) for single activity score = 3 points   UPPER EXTREMITY ROM and MMT:  BUE: WNL  HAND FUNCTION: Grip strength: Right: 72.3 lbs; Left: 69.6 lbs  COORDINATION: 9 Hole Peg test: Right: 25 sec; Left: 22 sec  SENSATION: No paresthesias in hands  EDEMA: none reported or observed  MUSCLE TONE: BUE WFL  COGNITION: Overall cognitive status: Within functional limits for tasks assessed  VISION: Subjective report: no change noticed other than fatigue Baseline vision: Bifocals, Wears glasses all the time, and progressive lenses Visual history:  none  VISION ASSESSMENT: WFL  PERCEPTION: Not tested  PRAXIS: Not tested  OBSERVATIONS: Pt appears well-kept though keeps eyes closed off and on throughout visit. Dons glasses for near activities and doffs for the remainder of the time. Ambulates with use of rollator and has cane with him. Pt demonstrating guarding  of neck while turning to sides but brings head to objects putting neck into flexion.                                                                                                                           TREATMENT :   OT educated pt on diaphragmatic breathing to help reduce muscle tension as well as stress/anxiety to promote remediation process. Pt was encouraged to complete this PRN throughout the day as well prior to sleep.   OT initiated scanning activities as noted in pt instructions to promote safe navigation with varying levels of stimuli.   Reviewed strategies from last visit to promote carryover. Pt required printed list of items discussed and mod cueing for recall .  OT educated the pt on the use of sunglasses and/or a ball cap to prevent glare and decrease photophobia when outdoors.   PATIENT EDUCATION: Education details:  see above Person educated: Patient and friend Education method: Programmer, multimedia, Facilities manager, Verbal cues, and Handouts Education comprehension: verbalized understanding, returned demonstration, verbal cues required, and needs further education  HOME EXERCISE PROGRAM: 11/13/2023: visual scanning activities  GOALS:  SHORT TERM GOALS: Target date: 12/05/2023    Patient will demonstrate HEP with visual handouts only for proper execution.  Baseline: Goal status: INITIAL  2.  Pt will verbalize understanding of stress reduction techniques as needed to promote needed rest following concussion/SDH.  Baseline:  Goal status: IN PROGRESS  3.  Pt will return demonstration of LB dressing with modifications/use of AD as needed to improve occupational performance.  Baseline:  Goal status: INITIAL   LONG TERM GOALS: Target date: 12/21/2023  Patient will report at least two-point increase in average PSFS score or at least three-point increase in a single activity score indicating functionally significant improvement given minimum detectable change.  Baseline: 1.3  total score (See above for individual activity scores)  Goal status: INITIAL   ASSESSMENT:  CLINICAL IMPRESSION: Patient demonstrates good understanding and carryover of therapy recommendations as needed to progress towards goals. Improved tolerance/ability to keep eyes open throughout visit this session compared to last session.   PERFORMANCE DEFICITS: in functional skills including ADLs, IADLs, pain, muscle spasms, body mechanics, endurance, decreased  knowledge of use of DME, vision, and vestibular.   IMPAIRMENTS: are limiting patient from ADLs, IADLs, rest and sleep, work, leisure, and social participation.   CO-MORBIDITIES: may have co-morbidities  that affects occupational performance. Patient will benefit from skilled OT to address above impairments and improve overall function.  REHAB POTENTIAL: Good  PLAN:  OT FREQUENCY: 1-2x/week  OT DURATION: 6 weeks  PLANNED INTERVENTIONS: 97168 OT Re-evaluation, 97535 self care/ADL training, 16109 therapeutic exercise, 97530 therapeutic activity, 97112 neuromuscular re-education, functional mobility training, visual/perceptual remediation/compensation, energy conservation, coping strategies training, patient/family education, and DME and/or AE instructions  RECOMMENDED OTHER SERVICES: N/A for this visit - pt has ST and PT referrals  CONSULTED AND AGREED WITH PLAN OF CARE: Patient and friend  PLAN FOR NEXT SESSION: scanning activities; activity modification; review breathing techniques   Delana Meyer, OT 11/12/2023, 2:46 PM

## 2023-11-13 ENCOUNTER — Ambulatory Visit

## 2023-11-13 DIAGNOSIS — R41841 Cognitive communication deficit: Secondary | ICD-10-CM

## 2023-11-13 DIAGNOSIS — M6281 Muscle weakness (generalized): Secondary | ICD-10-CM | POA: Diagnosis not present

## 2023-11-13 NOTE — Therapy (Unsigned)
 OUTPATIENT SPEECH LANGUAGE PATHOLOGY EVALUATION   Patient Name: Maurice Park MRN: [id number] DOB:24-[name]-1956, 68 y.o., male Today's Date: 11/14/2023  PCP: Lupita Raider MD REFERRING PROVIDER: Juliet Rude, PA-C (Doc sent to PCP above)  END OF SESSION:  End of Session - 11/13/23 1227     Visit Number 1    Number of Visits 17    Date for SLP Re-Evaluation 01/08/24    Authorization Type UHC    SLP Start Time 1230    SLP Stop Time  1315    SLP Time Calculation (min) 45 min    Activity Tolerance Patient tolerated treatment well             Past Medical History:  Diagnosis Date   Angina    Anxiety    Back pain    CAD (coronary artery disease), with CABG Lima to LAD, Lt. radial artery to 1st diagnal, sequential SVG to PDA and PLA, 12/20/09 April 2011   Carotid artery disease (HCC)    status post right carotid endarterectomy   Chest pain    Dysrhythmia    History of heart attack    Hyperlipidemia, controlled    Hypertension    Joint pain    Lactose intolerance    Palpitations    Peripheral vascular disease (HCC)    Renal artery stenosis (HCC)    Right hip pain    Situational mixed anxiety and depressive disorder,     Sleep apnea    SOB (shortness of breath)    Past Surgical History:  Procedure Laterality Date   ABDOMINAL AORTOGRAM W/LOWER EXTREMITY N/A 03/28/2021   Procedure: ABDOMINAL AORTOGRAM W/LOWER EXTREMITY;  Surgeon: Runell Gess, MD;  Location: [facility];  Service: Cardiovascular;  Laterality: N/A;  limited runoff   ANTERIOR CRUCIATE LIGAMENT REPAIR Left    CARDIAC CATHETERIZATION  12/14/2009   multivessel CAD >> CABG (Dr. Nicki Guadalajara)   CARDIAC CATHETERIZATION  10/03/2011   L Cfx w/80% ostial stenosis, ramus with 50% segmental mid stenosis; RCA dominant & occluded in midportion; LIMA to LAD patent; SVG to PDA/PLA patent; free L radial to diagonal functionally occluded (Dr. Erlene Quan)   CAROTID DOPPLER  11/2011   R CEA w/normal patency;  left bulb w/0-49% diameter reduction   CAROTID ENDARTERECTOMY  05/03/2006   Dr. Tawanna Cooler Early   CORONARY ANGIOPLASTY  02/21/2000   PTCA with 3.25x64mm Quantum Monorail balloon of in-stent posterolateral branch restenosis, reduced from 60% to under 20% (Dr. Erlene Quan)   CORONARY ARTERY BYPASS GRAFT  12/20/2009   LIMA-LAD, left radial-1st diagonal, SVG-PDA & PLA (Dr. Kathie Rhodes. Hendrickson)   HIP ARTHROSCOPY W/ LABRAL REPAIR     LEFT HEART CATHETERIZATION WITH CORONARY/GRAFT ANGIOGRAM N/A 10/03/2011   Procedure: LEFT HEART CATHETERIZATION WITH Isabel Caprice;  Surgeon: Runell Gess, MD;  Location: [facility] CATH LAB;  Service: Cardiovascular;  Laterality: N/A;   LOWER EXTREMITY ARTERIAL DOPPLER  11/2005   normal study    NM MYOCAR IMG MI  04/2011   bruce myoview - normal perfusion; EF 58%; low risk scan   PERIPHERAL VASCULAR INTERVENTION  03/28/2021   Procedure: PERIPHERAL VASCULAR INTERVENTION;  Surgeon: Runell Gess, MD;  Location: [facility];  Service: Cardiovascular;;  Right common iliac stent   RENAL DOPPLER  12/2012   right renal stent w/1-59% diameter reduction   TRANSTHORACIC ECHOCARDIOGRAM  04/2011   EF=>55%; trace MR; mild TR   Patient Active Problem List   Diagnosis Date Noted  Traumatic subarachnoid hemorrhage (HCC) 10/24/2023   Peripheral arterial disease (HCC) 03/11/2021   Numbness and tingling of right leg 01/27/2021   Costochondritis 11/15/2020   Metabolic syndrome 11/11/2020   Prediabetes 10/18/2020   Class 1 obesity with serious comorbidity and body mass index (BMI) of 32.0 to 32.9 in adult 10/18/2020   Essential hypertension 07/02/2018   Renal artery stenosis (HCC) 07/24/2013   Carotid artery disease (HCC) 07/24/2013   Angina at rest [facility]) 10/02/2011   CAD (coronary artery disease), with CABG Lima to LAD, Lt. radial artery to 1st diagnal, sequential SVG to PDA and PLA, 12/20/09 10/02/2011   Hyperlipidemia, controlled 10/02/2011   Situational mixed anxiety  and depressive disorder,  10/02/2011    ONSET DATE: 10/26/2023 (Date of referral)    REFERRING DIAG: S06.6XAA (ICD-10-CM) - Traumatic subarachnoid hemorrhage with unknown loss of consciousness status, initial encounter  THERAPY DIAG: Cognitive communication deficit  Rationale for Evaluation and Treatment: Rehabilitation  SUBJECTIVE:   SUBJECTIVE STATEMENT: "I'm tired today" Pt accompanied by: friend  PERTINENT HISTORY: "68 y/o male presents to [facility] on 2/12 after a ground level fall when playing tennis (+LOC with possible posturing for 2 minutes). Scans revealed a traumatic subarachnoid hemorrhage and an occipital fx . PMH includes: PAD, CAD (s/p CABG in 2011), HLD, HTN, and OSA, R THA"   PAIN: Are you having pain? No  FALLS: Has patient fallen in last 6 months?  Yes, Comment: fell while playing tennis resulting in [facility]  LIVING ENVIRONMENT: Lives with: lives with their family Lives in: House/apartment  PLOF:  Level of assistance: Independent with ADLs, Independent with IADLs Employment: Environmental education officer employment Economist)  PATIENT GOALS: "my reactions, being frustrated. Be able to handle every day tasks"  OBJECTIVE:  Note: Objective measures were completed at Evaluation unless otherwise noted.  COGNITION: Overall cognitive status: Impaired Areas of impairment:  Attention: Impaired: Sustained, Divided Memory: Impaired: Working Awareness: Impaired: Emergent Executive function: Impaired: Error awareness and Slow processing Functional deficits: Pt aware of slight memory changes, for example being able to recall favorite restaurant in conversation. Has started to return to playing music but mostly just picking for fun. Planned to try bill pay later today. 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   COGNITIVE COMMUNICATION: Following directions: Follows multi-step commands inconsistently  Auditory comprehension: WFL Verbal  expression: WFL Functional communication: WFL  ORAL MOTOR EXAMINATION: Overall status: WFL  STANDARDIZED ASSESSMENTS: CLQT initiated but terminated d/t brain fog and frustration  To be completed next session Electronics engineer, mazes, Orthoptist) as time allows   PATIENT REPORTED OUTCOME MEASURES (PROM): Not completed d/t time constraints   Complete next session (CF or MMQ)                                                                                                                           TREATMENT DATE:  11/13/23: Initiated education and instruction of cognitively engaging tasks for home,  including re/learning new music and reading. Recommended patient read instructions/information aloud to aid attention to detail given error exhibited on clock drawing. Pt and friend verbalized understanding.   PATIENT EDUCATION: Education details: see above Person educated: Patient and Friend Education method: Explanation Education comprehension: verbalized understanding, returned demonstration, and needs further education   GOALS: Goals reviewed with patient? Yes  SHORT TERM GOALS: Target date: 12/11/2023  Complete CLQT and/or cognitive PROM (add goals as needed) Baseline: Goal status: INITIAL  2.  Pt will ID appropriate strategy/compensation for individual cognitive challenges x2 given rare min A Baseline:  Goal status: INITIAL  3.  Pt will utilize cognitive strategies to optimize performance on simulated household tasks given rare min A  Baseline:  Goal status: INITIAL  4.  Pt will utilize cognitive strategies to optimize performance on simulated work tasks given rare min A  Baseline:  Goal status: INITIAL   LONG TERM GOALS: Target date: 01/08/2024  Pt will report successful carryover of cognitive strategies at home/work with mod I  Baseline:  Goal status: INITIAL  2.  Pt will demonstrate awareness of potential cognitive challenges and teach back appropriate strategy to  optimize performance  Baseline:  Goal status: INITIAL  3.  Pt will report improved cognitive functioning via PROM by LTG date  Baseline: complete 1st session  Goal status: INITIAL   ASSESSMENT:  CLINICAL IMPRESSION: Patient is a 68 y.o. M who was seen today for speech therapy evaluation following SAH d/t fall. Endorsed mild memory changes and delayed processing since fall. Has not challenged self to return to many prior tasks involving higher level cognitive skills at this time. CLQT initiated today, which revealed mildly reduced working memory for multi-step task and mildly reduced attention to detail. Demonstrated overt frustration with testing performance and concern for return to prior baseline. Provided recommendations to optimize current cognitive functioning at home. Pt would benefit from skilled ST intervention to optimize return to cognitive baseline.   OBJECTIVE IMPAIRMENTS: include attention, memory, and executive functioning. These impairments are limiting patient from return to work and ADLs/IADLs. Factors affecting potential to achieve goals and functional outcome are  None . Patient will benefit from skilled SLP services to address above impairments and improve overall function.  REHAB POTENTIAL: Good  PLAN:  SLP FREQUENCY: 1-2x/week  SLP DURATION: 8 weeks  PLANNED INTERVENTIONS: Environmental controls, Cueing hierachy, Cognitive reorganization, Functional tasks, SLP instruction and feedback, Compensatory strategies, Patient/family education, and 16109 Treatment of speech (30 or 45 min)     Gracy Racer, CCC-SLP 11/14/2023, 8:00 AM

## 2023-11-14 ENCOUNTER — Ambulatory Visit: Payer: Self-pay | Admitting: Physical Therapy

## 2023-11-14 ENCOUNTER — Ambulatory Visit: Payer: 59 | Admitting: Occupational Therapy

## 2023-11-14 DIAGNOSIS — R29818 Other symptoms and signs involving the nervous system: Secondary | ICD-10-CM

## 2023-11-14 DIAGNOSIS — M79605 Pain in left leg: Secondary | ICD-10-CM

## 2023-11-14 DIAGNOSIS — R6889 Other general symptoms and signs: Secondary | ICD-10-CM

## 2023-11-14 DIAGNOSIS — R2681 Unsteadiness on feet: Secondary | ICD-10-CM

## 2023-11-14 DIAGNOSIS — M6281 Muscle weakness (generalized): Secondary | ICD-10-CM

## 2023-11-14 DIAGNOSIS — R2689 Other abnormalities of gait and mobility: Secondary | ICD-10-CM

## 2023-11-14 DIAGNOSIS — M25551 Pain in right hip: Secondary | ICD-10-CM

## 2023-11-14 NOTE — Therapy (Signed)
 OUTPATIENT PHYSICAL THERAPY NEURO TREATMENT   Patient Name: Maurice Park MRN: [id number] DOB:03-31-1956, 68 y.o., male Today's Date: 11/14/2023   PCP: Lupita Raider, MD REFERRING PROVIDER: Juliet Rude, PA-C  END OF SESSION:  PT End of Session - 11/14/23 1531     Visit Number 3    Number of Visits 13   with eval   Date for PT Re-Evaluation 01/04/24    Authorization Type [employer]    PT Start Time 1530    PT Stop Time 1611    PT Time Calculation (min) 41 min    Equipment Utilized During Treatment Gait belt    Activity Tolerance Patient limited by pain   onset of R hip pain   Behavior During Therapy WFL for tasks assessed/performed               Past Medical History:  Diagnosis Date   Angina    Anxiety    Back pain    CAD (coronary artery disease), with CABG [place] to LAD, Lt. radial artery to 1st diagnal, sequential SVG to PDA and PLA, 12/20/09 April 2011   Carotid artery disease (HCC)    status post right carotid endarterectomy   Chest pain    Dysrhythmia    History of heart attack    Hyperlipidemia, controlled    Hypertension    Joint pain    Lactose intolerance    Palpitations    Peripheral vascular disease (HCC)    Renal artery stenosis (HCC)    Right hip pain    Situational mixed anxiety and depressive disorder,     Sleep apnea    SOB (shortness of breath)    Past Surgical History:  Procedure Laterality Date   ABDOMINAL AORTOGRAM W/LOWER EXTREMITY N/A 03/28/2021   Procedure: ABDOMINAL AORTOGRAM W/LOWER EXTREMITY;  Surgeon: Runell Gess, MD;  Location: [facility];  Service: Cardiovascular;  Laterality: N/A;  limited runoff   ANTERIOR CRUCIATE LIGAMENT REPAIR Left    CARDIAC CATHETERIZATION  12/14/2009   multivessel CAD >> CABG (Dr. Nicki Guadalajara)   CARDIAC CATHETERIZATION  10/03/2011   L Cfx w/80% ostial stenosis, ramus with 50% segmental mid stenosis; RCA dominant & occluded in midportion; LIMA to LAD patent; SVG to PDA/PLA patent; free L  radial to diagonal functionally occluded (Dr. Erlene Quan)   CAROTID DOPPLER  11/2011   R CEA w/normal patency; left bulb w/0-49% diameter reduction   CAROTID ENDARTERECTOMY  05/03/2006   Dr. Tawanna Cooler Early   CORONARY ANGIOPLASTY  02/21/2000   PTCA with 3.25x46mm Quantum Monorail balloon of in-stent posterolateral branch restenosis, reduced from 60% to under 20% (Dr. Erlene Quan)   CORONARY ARTERY BYPASS GRAFT  12/20/2009   LIMA-LAD, left radial-1st diagonal, SVG-PDA & PLA (Dr. Kathie Rhodes. Hendrickson)   HIP ARTHROSCOPY W/ LABRAL REPAIR     LEFT HEART CATHETERIZATION WITH CORONARY/GRAFT ANGIOGRAM N/A 10/03/2011   Procedure: LEFT HEART CATHETERIZATION WITH Isabel Caprice;  Surgeon: Runell Gess, MD;  Location: [facility] CATH LAB;  Service: Cardiovascular;  Laterality: N/A;   LOWER EXTREMITY ARTERIAL DOPPLER  11/2005   normal study    NM MYOCAR IMG MI  04/2011   bruce myoview - normal perfusion; EF 58%; low risk scan   PERIPHERAL VASCULAR INTERVENTION  03/28/2021   Procedure: PERIPHERAL VASCULAR INTERVENTION;  Surgeon: Runell Gess, MD;  Location: [facility];  Service: Cardiovascular;;  Right common iliac stent   RENAL DOPPLER  12/2012   right renal stent w/1-59% diameter reduction  TRANSTHORACIC ECHOCARDIOGRAM  04/2011   EF=>55%; trace MR; mild TR   Patient Active Problem List   Diagnosis Date Noted   Traumatic subarachnoid hemorrhage (HCC) 10/24/2023   Peripheral arterial disease (HCC) 03/11/2021   Numbness and tingling of right leg 01/27/2021   Costochondritis 11/15/2020   Metabolic syndrome 11/11/2020   Prediabetes 10/18/2020   Class 1 obesity with serious comorbidity and body mass index (BMI) of 32.0 to 32.9 in adult 10/18/2020   Essential hypertension 07/02/2018   Renal artery stenosis (HCC) 07/24/2013   Carotid artery disease (HCC) 07/24/2013   Angina at rest [facility]) 10/02/2011   CAD (coronary artery disease), with CABG [place] to LAD, Lt. radial artery to 1st diagnal,  sequential SVG to PDA and PLA, 12/20/09 10/02/2011   Hyperlipidemia, controlled 10/02/2011   Situational mixed anxiety and depressive disorder,  10/02/2011    ONSET DATE: 10/26/2023 (referral date)  REFERRING DIAG: S06.6XAA (ICD-10-CM) - Traumatic subarachnoid hemorrhage with unknown loss of consciousness status, initial encounter (HCC)  THERAPY DIAG:  Muscle weakness (generalized)  Other abnormalities of gait and mobility  Unsteadiness on feet  Pain in left leg  Pain in right hip  Rationale for Evaluation and Treatment: Rehabilitation  SUBJECTIVE:                                                                                                                                                                                             SUBJECTIVE STATEMENT: "Maurice Park"  Pt demonstrates hip and leg exercises he has been doing, R hip flexor stretch in supine and SLR. Pt has also been working on exercises prescribed last session, continues to struggle with sideling hip abd and can only do 2 x 5 reps before fatigue. Pt has been foam-rolling his glutes and hamstrings as well.  Pt reports he has not been using an AD, does use his cane at home if he gets up to use the restroom at the end of the night. Pt focusing on heel/toe walking. Pt with no R hip pain at rest, does have some tenderness in his L glute from stretching. Pt has not had any spasms down his LLE since last visit and has had no major headaches since Sunday.  Pt accompanied by: self and friend (friend [name])  PERTINENT HISTORY:  PMH includes: PAD, CAD (s/p CABG in 2011), HLD, HTN, and OSA, R THA  Pt admitted to [facility] on 10/24/2023 after a ground level fall when playing tennis. Scans revealed a traumatic subarachnoid hemorrhage and an occipital fx.   PAIN:  Are you having pain? Yes: NPRS scale: 2/10 Pain location: hips/glutes down into hamstrings (L>R)  Pain description: deep joint ache/soreness Aggravating factors: too much  activity Relieving factors: magnesium oil, Advil for arthritis, pain meds, heat  PRECAUTIONS: Fall  RED FLAGS: None   WEIGHT BEARING RESTRICTIONS: No  FALLS: Has patient fallen in last 6 months? Yes. 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  DIAGNOSTIC FINDINGS:  Cervical MRI 11/08/23, pending results  Cervical Spine Xray 10/25/23 IMPRESSION: 1. No acute findings. 2. Mild retrolisthesis at C5-6 without dynamic instability.  Head CT 10/24/13 FINDINGS: Brain: Scattered subarachnoid hemorrhage along the cerebral convexities, left sylvian fissure, and inter peduncular cistern is diffusely and mildly increased. Some of this change is related to redistribution with left subarachnoid blood seen along the inferior frontal lobes. Suspect mild inferior frontal lobe hemorrhage and edema from contusion, hemorrhage on the left measuring up to 5 mm. The pattern is compatible with history of head trauma. No evidence of infarct, hydrocephalus, or mass   Vascular: No hyperdense vessel or unexpected calcification.   Skull: Vertical, nondisplaced right occipital bone fracture, known.   Sinuses/Orbits: Generalized mucosal thickening in the ethmoid, sphenoid, and frontal sinuses.   IMPRESSION: 1. Scattered subarachnoid hemorrhage with  mild increase/redistribution from yesterday. 2. Possible small inferior frontal lobe contusions. 3. Known occipital bone fracture that is nondepressed.  Head CT 10/30/2023 IMPRESSION: 1. Unchanged appearance of small anterior frontal lobe hemorrhagic contusions. 2. Unchanged appearance of nondisplaced right occipital fracture.  COGNITION: Overall cognitive status: Within functional limits for tasks assessed   SENSATION: WFL  POSTURE: posterior pelvic tilt  LOWER EXTREMITY ROM:     Passive  Right Eval Left Eval  Hip flexion    Hip extension    Hip abduction    Hip adduction    Hip internal rotation    Hip external rotation    Knee flexion    Knee extension Tight HS Tight HS  Ankle dorsiflexion    Ankle plantarflexion    Ankle inversion    Ankle eversion     (Blank rows = not tested)  LOWER EXTREMITY MMT:    MMT Right Eval Left Eval  Hip flexion 5 5  Hip extension    Hip abduction    Hip adduction    Hip internal rotation    Hip external rotation    Knee flexion 5 5  Knee extension 5 5  Ankle dorsiflexion 5 5  Ankle plantarflexion    Ankle inversion    Ankle eversion    (Blank rows = not tested)  BED MOBILITY:  Slow process per patient report, has to orient himself  TRANSFERS: Assistive device utilized: None  Sit to stand: Modified independence (increased time) Stand to sit: Modified independence Chair to chair: Modified independence Floor:  not assessed at eval   STAIRS: Level of Assistance: Modified independence Stair Negotiation Technique: Alternating Pattern  with Bilateral Rails Number of Stairs: 4  Height of Stairs: 6  Comments: [facility]  GAIT: Gait pattern: decreased hip/knee flexion- Right, decreased hip/knee flexion- Left, antalgic, and trunk flexed Distance walked: various clinic distances Assistive device utilized: None Level of assistance: CGA Comments: with onset of R hip pain exhibits increase in antalgic gait pattern                                                                                                                                 TREATMENT:  TherEx Standing therex to address R hip abd weakness in // bars with intermittent UE support: Lateral sidestepping in // bars with red theraband around ankles, 3 x 10 ft L/R Most difficulty with eccentric control of R hip Initially with mm pain but onset of joint pain towards end of last set Tried monster walks with red theraband around ankles but pt has significant increase in R hip pain, deferred this exercise  Pigeon pose stretch on mat table 2 x 30 sec each B More difficulty performing on R side Feels HS stretch on L side  Gait x 345 ft before onset of R hip joint pain, an improvement from previous session in which patient was able to ambulate x 230 ft before onset of R hip pain. Encouraged pt to reach out to his PCP for a referral to ortho physician if his R hip pain continues into next week for further imaging of joint.   Manual Therapy To address ongoing R hip pain: Distraction of R hip 5 x 30 sec each Posterior hip glide of R hip grade 2-3 Pt with some relief of pain/symptoms with distraction and joint glides  Pt demonstrates a stretch he has been doing at home moving his hip through full ROM with knee bent, reports having pain in his joint with compression through his leg. Educated patient on why he feels increased pain with compression of hip joint and an improvement in his pain with joint distraction.   PATIENT EDUCATION: Education details: continue HEP as tolerated, reach out to PCP regarding ongoing R hip pain if it persists into next week for further imaging Person educated: Patient and friend Kim Education method: Medical illustrator Education comprehension: verbalized understanding, returned demonstration, and needs further education  HOME EXERCISE PROGRAM: Access Code: [email or web address] URL: [email or web address] Date:  11/12/2023 Prepared by: Peter Congo  Exercises - Seated Hamstring Stretch with Strap  - 1 x daily - 7 x weekly - 1 sets - 3-5 reps - 30-60 hold - Hamstring Mobilization with Foam Roll  - 1-2 x daily - 7 x weekly - 1 sets - 10 reps - Gluteus Mobilization with Foam Roll  - 1-2 x daily - 7 x weekly - 1 sets - 10 reps - Clamshell  - 1 x daily - 7 x weekly - 3 sets - 5-10 reps - Sidelying Diagonal Hip Abduction  - 1 x daily - 7 x weekly - 3 sets - 5 reps  GOALS: Goals reviewed with patient? Yes  SHORT  TERM GOALS: Target date: 11/30/2023  Pt will be independent with initial HEP for improved strength, balance, transfers and gait and management of his pain symptoms. Baseline: Goal status: INITIAL  2.  Pt will improve gait velocity to at least 2.75 ft/sec for improved gait efficiency and performance at SBA level  Baseline: 2.45 ft/sec no AD (2/28) Goal status: INITIAL  3.  Pt will improve 5 x STS to less than or equal to 17 seconds to demonstrate improved functional strength and transfer efficiency.  Baseline: 20 sec no UE (2/28) Goal status: INITIAL  4.  Pt will improve FGA to 16/30 for decreased fall risk  Baseline: 12/30 (2/28) Goal status: INITIAL  5.  Vestibular system to be assessed and LTG set as appropriate Baseline:  Goal status: INITIAL   LONG TERM GOALS: Target date: 12/21/2023   Pt will be independent with final HEP for improved strength, balance, transfers and gait and management of his pain symptoms. Baseline:  Goal status: INITIAL  2.  Pt will improve gait velocity to at least 3.0 ft/sec for improved gait efficiency and performance at mod I level  Baseline: 2.45 ft/sec no AD (2/28) Goal status: INITIAL  3.  Pt will improve 5 x STS to less than or equal to 14 seconds to demonstrate improved functional strength and transfer efficiency.  Baseline: 20 sec no UE (2/28) Goal status: INITIAL  4.  Pt will improve FGA to 20/30 for decreased fall risk  Baseline: 12/30  (2/28) Goal status: INITIAL  5.  Vestibular system to be assessed and LTG set as appropriate Baseline:  Goal status: INITIAL   ASSESSMENT:  CLINICAL IMPRESSION: Emphasis of skilled PT session on continuing to work on functional strengthening of R hip due to ongoing R hip abd weakness as well as performing manual therapy and various stretches in attempts to decrease pain. Pt initially with onset of muscle soreness and fatigue with exercise that increases to joint pain, especially with monster walks. Deferred adding any exercises to HEP due to R hip pain being limiting for patient this session, encouraged him to reach out to PCP for a referral to an ortho physician for further imaging of his hip due to ongoing pain. Pt also has decreased dizziness with positional changes this session. Pt continues to benefit from skilled PT services to work on improving his balance, muscle strength, and return to PLOF. Continue POC.    OBJECTIVE IMPAIRMENTS: Abnormal gait, decreased activity tolerance, decreased knowledge of condition, decreased mobility, difficulty walking, decreased ROM, decreased strength, dizziness, impaired perceived functional ability, increased muscle spasms, impaired vision/preception, and pain.   ACTIVITY LIMITATIONS: carrying, lifting, bending, standing, stairs, transfers, and bed mobility  PARTICIPATION LIMITATIONS: meal prep, cleaning, interpersonal relationship, driving, occupation, and yard work  PERSONAL FACTORS: 3+ comorbidities:   PAD, CAD (s/p CABG in 2011), HLD, HTN, and OSA, R THAare also affecting patient's functional outcome.   REHAB POTENTIAL: Good  CLINICAL DECISION MAKING: Evolving/moderate complexity  EVALUATION COMPLEXITY: High  PLAN:  PT FREQUENCY: 1-2x/week  PT DURATION: 6 weeks  PLANNED INTERVENTIONS: 97164- PT Re-evaluation, 97110-Therapeutic exercises, 97530- Therapeutic activity, 97112- Neuromuscular re-education, 97535- Self Care, [id number]- Manual  therapy, [phone]- Gait training, [phone]- Electrical stimulation (manual), Patient/Family education, Balance training, Stair training, Taping, Dry Needling, Joint mobilization, Spinal mobilization, Vestibular training, Visual/preceptual remediation/compensation, DME instructions, Cryotherapy, and Moist heat  PLAN FOR NEXT SESSION: results of cervical MRI and assess cervical ROM?, add to HEP to address L HS and glute tightness, R hip weakness, balance  impairments based on FGA (head turns, pivots/turns, stepping over obstacles); how is R hip pain doing - did he reach out to PCP for referral to ortho for more imaging? SARAH: please assess dizziness with positional changes as safe and able - may be related to his BI but just wanted to see   Peter Congo, PT Peter Congo, PT, DPT, CSRS  11/14/2023, 4:24 PM

## 2023-11-14 NOTE — Therapy (Signed)
 OUTPATIENT OCCUPATIONAL THERAPY NEURO TREATMENT  Patient Name: Maurice Park MRN: [id number] DOB:[birth year], 68 y.o., male Today's Date: 11/14/2023  PCP: Lupita Raider, MD  REFERRING PROVIDER: Juliet Rude, PA-C  END OF SESSION:  OT End of Session - 11/14/23 1408     Visit Number 3    Number of Visits 13    Date for OT Re-Evaluation 12/21/23    Authorization Type UHC    OT Start Time 1408    OT Stop Time 1446    OT Time Calculation (min) 38 min    Activity Tolerance Patient tolerated treatment well    Behavior During Therapy WFL for tasks assessed/performed             Past Medical History:  Diagnosis Date   Angina    Anxiety    Back pain    CAD (coronary artery disease), with CABG Lima to LAD, Lt. radial artery to 1st diagnal, sequential SVG to PDA and PLA, 12/20/09 April 2011   Carotid artery disease (HCC)    status post right carotid endarterectomy   Chest pain    Dysrhythmia    History of heart attack    Hyperlipidemia, controlled    Hypertension    Joint pain    Lactose intolerance    Palpitations    Peripheral vascular disease (HCC)    Renal artery stenosis (HCC)    Right hip pain    Situational mixed anxiety and depressive disorder,     Sleep apnea    SOB (shortness of breath)    Past Surgical History:  Procedure Laterality Date   ABDOMINAL AORTOGRAM W/LOWER EXTREMITY N/A 03/28/2021   Procedure: ABDOMINAL AORTOGRAM W/LOWER EXTREMITY;  Surgeon: Runell Gess, MD;  Location: [facility];  Service: Cardiovascular;  Laterality: N/A;  limited runoff   ANTERIOR CRUCIATE LIGAMENT REPAIR Left    CARDIAC CATHETERIZATION  12/14/2009   multivessel CAD >> CABG (Dr. Nicki Guadalajara)   CARDIAC CATHETERIZATION  10/03/2011   L Cfx w/80% ostial stenosis, ramus with 50% segmental mid stenosis; RCA dominant & occluded in midportion; LIMA to LAD patent; SVG to PDA/PLA patent; free L radial to diagonal functionally occluded (Dr. Erlene Quan)   CAROTID DOPPLER   11/2011   R CEA w/normal patency; left bulb w/0-49% diameter reduction   CAROTID ENDARTERECTOMY  05/03/2006   Dr. Tawanna Cooler Early   CORONARY ANGIOPLASTY  02/21/2000   PTCA with 3.25x60mm Quantum Monorail balloon of in-stent posterolateral branch restenosis, reduced from 60% to under 20% (Dr. Erlene Quan)   CORONARY ARTERY BYPASS GRAFT  12/20/2009   LIMA-LAD, left radial-1st diagonal, SVG-PDA & PLA (Dr. Kathie Rhodes. Hendrickson)   HIP ARTHROSCOPY W/ LABRAL REPAIR     LEFT HEART CATHETERIZATION WITH CORONARY/GRAFT ANGIOGRAM N/A 10/03/2011   Procedure: LEFT HEART CATHETERIZATION WITH Isabel Caprice;  Surgeon: Runell Gess, MD;  Location: [facility] CATH LAB;  Service: Cardiovascular;  Laterality: N/A;   LOWER EXTREMITY ARTERIAL DOPPLER  11/2005   normal study    NM MYOCAR IMG MI  04/2011   bruce myoview - normal perfusion; EF 58%; low risk scan   PERIPHERAL VASCULAR INTERVENTION  03/28/2021   Procedure: PERIPHERAL VASCULAR INTERVENTION;  Surgeon: Runell Gess, MD;  Location: [facility];  Service: Cardiovascular;;  Right common iliac stent   RENAL DOPPLER  12/2012   right renal stent w/1-59% diameter reduction   TRANSTHORACIC ECHOCARDIOGRAM  04/2011   EF=>55%; trace MR; mild TR   Patient Active Problem List  Diagnosis Date Noted   Traumatic subarachnoid hemorrhage (HCC) 10/24/2023   Peripheral arterial disease (HCC) 03/11/2021   Numbness and tingling of right leg 01/27/2021   Costochondritis 11/15/2020   Metabolic syndrome 11/11/2020   Prediabetes 10/18/2020   Class 1 obesity with serious comorbidity and body mass index (BMI) of 32.0 to 32.9 in adult 10/18/2020   Essential hypertension 07/02/2018   Renal artery stenosis (HCC) 07/24/2013   Carotid artery disease (HCC) 07/24/2013   Angina at rest [facility]) 10/02/2011   CAD (coronary artery disease), with CABG Lima to LAD, Lt. radial artery to 1st diagnal, sequential SVG to PDA and PLA, 12/20/09 10/02/2011   Hyperlipidemia, controlled  10/02/2011   Situational mixed anxiety and depressive disorder,  10/02/2011   ONSET DATE: 10/26/2023 (Date of referral)  REFERRING DIAG: S06.6XAA (ICD-10-CM) - Traumatic subarachnoid hemorrhage with unknown loss of consciousness status, initial encounter   THERAPY DIAG:  Muscle weakness (generalized)  Other symptoms and signs involving the nervous system  Decreased functional activity tolerance  Rationale for Evaluation and Treatment: Rehabilitation  SUBJECTIVE:   SUBJECTIVE STATEMENT: He did all his breathing this morning and listened to music.   Pt accompanied by: friend - Kim, also OT  PERTINENT HISTORY: PMH includes: PAD, CAD (s/p CABG in 2011), HLD, HTN, and OSA, R THA   Pt admitted to [facility] on 10/24/2023 after a ground level fall when playing tennis. Scans revealed a traumatic subarachnoid hemorrhage and an occipital fx   PRECAUTIONS: Fall and Other: concussion  WEIGHT BEARING RESTRICTIONS: No  PAIN:  Are you having pain? Yes: NPRS scale: 2/10 Pain location: LLE Pain description: sore Aggravating factors: fatigue Relieving factors: rest, medications  FALLS: Has patient fallen in last 6 months? Yes. Number of falls 1 - fell when playing tennis, resulting in [facility]  LIVING ENVIRONMENT: Family/patient expects to be discharged to:: Private residence Living Arrangements: Spouse/significant other Available Help at Discharge: Family Type of Home: House Home Access: Other (comment) (2 steps in house, no railings to support) Home Layout: One level Bathroom Shower/Tub: Health visitor: Handicapped height Bathroom Accessibility: Yes Home Equipment: Crutches;Shower seat - built in;Grab bars - tub/shower Additional Comments: dog that is blind 45 yo  Lives With: Spouse    PLOF: Independent; was driving; Electronics engineer with lots of computer, phone, and video chats  PATIENT GOALS: return to playing guitar and outdoor work  OBJECTIVE:  Note:  Objective measures were completed at Evaluation unless otherwise noted.  HAND DOMINANCE: Right  ADLs: Eating: is cautious around sharp items such as a knife Grooming: unable to bend forward to spit into sink UB Dressing: I LB Dressing: requires help to don shoes and socks as reaching provokes symptoms Toileting: mod I Bathing: mod I Equipment: Grab bars, Walk in shower, Reacher, and HHSH  IADLs: Shopping: has not tried Light housekeeping: light housekeeping only Meal Prep: dependent Community mobility: dependent Medication management: mod I Financial management: mod I Handwriting:  no change  MOBILITY STATUS: Needs Assist: Rollator for long distances, cane in home  ACTIVITY TOLERANCE: Activity tolerance: poor  FUNCTIONAL OUTCOME MEASURES: PSFS: 1.3 total score   Total score = sum of the activity scores/number of activities Minimum detectable change (90%CI) for average score = 2 points Minimum detectable change (90%CI) for single activity score = 3 points   UPPER EXTREMITY ROM and MMT:    BUE: WNL  HAND FUNCTION: Grip strength: Right: 72.3 lbs; Left: 69.6 lbs  COORDINATION: 9 Hole Peg test: Right: 25 sec;  Left: 22 sec  SENSATION: No paresthesias in hands  EDEMA: none reported or observed  MUSCLE TONE: BUE WFL  COGNITION: Overall cognitive status: Within functional limits for tasks assessed  VISION: Subjective report: no change noticed other than fatigue Baseline vision: Bifocals, Wears glasses all the time, and progressive lenses Visual history:  none  VISION ASSESSMENT: WFL  PERCEPTION: Not tested  PRAXIS: Not tested  OBSERVATIONS: Pt appears well-kept though keeps eyes closed off and on throughout visit. Dons glasses for near activities and doffs for the remainder of the time. Ambulates with use of rollator and has cane with him. Pt demonstrating guarding of neck while turning to sides but brings head to objects putting neck into flexion.                                                                                                                            TREATMENT :   OT educated pt on table top play of Golf Solitaire for BUE ROM, coordination, visual processing, scanning, and sequencing. Pt required minimal cues for proper play.   Pt placed and then removed colored, small pegs into corresponding hole with use of pattern sheets for scanning, pattern recognition, ROM, coordination, and strength of affected extremity.   Pt assembled 7 Pieces of Cleverness for coordination and visuospatial relations. Pt completing 3 puzzles with min cueing for proper assembly.   PATIENT EDUCATION: Education details:  see above Person educated: Patient and friend Education method: Explanation, Demonstration, and Verbal cues Education comprehension: verbalized understanding, returned demonstration, verbal cues required, and needs further education  HOME EXERCISE PROGRAM: 11/13/2023: visual scanning activities  GOALS:  SHORT TERM GOALS: Target date: 12/05/2023    Patient will demonstrate HEP with visual handouts only for proper execution.  Baseline: Goal status: INITIAL  2.  Pt will verbalize understanding of stress reduction techniques as needed to promote needed rest following concussion/SDH.  Baseline:  Goal status: IN PROGRESS  3.  Pt will return demonstration of LB dressing with modifications/use of AD as needed to improve occupational performance.  Baseline:  Goal status: INITIAL   LONG TERM GOALS: Target date: 12/21/2023  Patient will report at least two-point increase in average PSFS score or at least three-point increase in a single activity score indicating functionally significant improvement given minimum detectable change.  Baseline: 1.3 total score (See above for individual activity scores)  Goal status: INITIAL   ASSESSMENT:  CLINICAL IMPRESSION: Patient demonstrates good activity performance this visit requiring spatial  relations, visual scanning, coordination, and processing of various stimuli. Will assess progression towards goals next session.   PERFORMANCE DEFICITS: in functional skills including ADLs, IADLs, pain, muscle spasms, body mechanics, endurance, decreased knowledge of use of DME, vision, and vestibular.   IMPAIRMENTS: are limiting patient from ADLs, IADLs, rest and sleep, work, leisure, and social participation.   CO-MORBIDITIES: may have co-morbidities  that affects occupational performance. Patient will benefit from skilled OT to address above impairments and improve  overall function.  REHAB POTENTIAL: Good  PLAN:  OT FREQUENCY: 1-2x/week  OT DURATION: 6 weeks  PLANNED INTERVENTIONS: 97168 OT Re-evaluation, 97535 self care/ADL training, 60454 therapeutic exercise, 97530 therapeutic activity, 97112 neuromuscular re-education, functional mobility training, visual/perceptual remediation/compensation, energy conservation, coping strategies training, patient/family education, and DME and/or AE instructions  RECOMMENDED OTHER SERVICES: N/A for this visit - pt has ST and PT referrals  CONSULTED AND AGREED WITH PLAN OF CARE: Patient and friend  PLAN FOR NEXT SESSION: Review goals; scanning activities; activity modification   Delana Meyer, OT 11/14/2023, 3:40 PM

## 2023-11-19 ENCOUNTER — Encounter: Payer: Self-pay | Admitting: Speech Pathology

## 2023-11-19 ENCOUNTER — Ambulatory Visit: Admitting: Speech Pathology

## 2023-11-19 DIAGNOSIS — M6281 Muscle weakness (generalized): Secondary | ICD-10-CM | POA: Diagnosis not present

## 2023-11-19 DIAGNOSIS — R41841 Cognitive communication deficit: Secondary | ICD-10-CM

## 2023-11-19 NOTE — Patient Instructions (Addendum)
  Tips to help facilitate better attention, concentration, focus   Do harder, longer tasks when you are most alert/awake  Break down larger tasks into small parts - set a timer for 15-20 minutes  Keep a list of priorities for the week - bring it in  Limit distractions of TV, radio, conversation, e mails/texts, appliance noise, etc - if a job is important, do it in a quiet room  Be aware of how you are functioning in high stimulation environments such as large stores, parties, restaurants - any place with lots of lights, noise, signs etc    Group conversations may be more difficult to process than one on one conversations  Give yourself extra time to process conversation, reading materials, directions or information from your healthcare providers  Organization is key - clutters of laundry, mail, paperwork, dirty dishes - all make it more difficult to concentrate  Before you start a task, have all the needed supplies, directions, recipes ready and organized. This way you don't have to go looking for something in the middle of a task and become distracted.   Be aware of fatigue - take rests or breaks when needed to re-group and re-focus  Since your brain bleed, you have less stamina and energy - this is normal  You need to prioritize how you will spend you pennies (your energy) - choose what is most important to you  Noises, lights high stimulation places will take your pennies - if you need to take a break in a quiet place

## 2023-11-19 NOTE — Therapy (Signed)
 OUTPATIENT SPEECH LANGUAGE PATHOLOGY TREATMENT   Patient Name: Maurice Park MRN: [id number] DOB:[birth year], 68 y.o., male Today's Date: 11/19/2023  PCP: Lupita Raider MD REFERRING PROVIDER: Juliet Rude, PA-C (Doc sent to PCP above)  END OF SESSION:  End of Session - 11/19/23 1317     Visit Number 2    Number of Visits 17    Date for SLP Re-Evaluation 01/08/24    Authorization Type UHC    SLP Start Time 1315    SLP Stop Time  1400    SLP Time Calculation (min) 45 min    Activity Tolerance Patient tolerated treatment well             Past Medical History:  Diagnosis Date   Angina    Anxiety    Back pain    CAD (coronary artery disease), with CABG [place] to LAD, Lt. radial artery to 1st diagnal, sequential SVG to PDA and PLA, 12/20/09 April 2011   Carotid artery disease (HCC)    status post right carotid endarterectomy   Chest pain    Dysrhythmia    History of heart attack    Hyperlipidemia, controlled    Hypertension    Joint pain    Lactose intolerance    Palpitations    Peripheral vascular disease (HCC)    Renal artery stenosis (HCC)    Right hip pain    Situational mixed anxiety and depressive disorder,     Sleep apnea    SOB (shortness of breath)    Past Surgical History:  Procedure Laterality Date   ABDOMINAL AORTOGRAM W/LOWER EXTREMITY N/A 03/28/2021   Procedure: ABDOMINAL AORTOGRAM W/LOWER EXTREMITY;  Surgeon: Runell Gess, MD;  Location: [facility];  Service: Cardiovascular;  Laterality: N/A;  limited runoff   ANTERIOR CRUCIATE LIGAMENT REPAIR Left    CARDIAC CATHETERIZATION  12/14/2009   multivessel CAD >> CABG (Dr. Nicki Guadalajara)   CARDIAC CATHETERIZATION  10/03/2011   L Cfx w/80% ostial stenosis, ramus with 50% segmental mid stenosis; RCA dominant & occluded in midportion; LIMA to LAD patent; SVG to PDA/PLA patent; free L radial to diagonal functionally occluded (Dr. Erlene Quan)   CAROTID DOPPLER  11/2011   R CEA w/normal patency;  left bulb w/0-49% diameter reduction   CAROTID ENDARTERECTOMY  05/03/2006   Dr. Tawanna Cooler Early   CORONARY ANGIOPLASTY  02/21/2000   PTCA with 3.25x68mm Quantum Monorail balloon of in-stent posterolateral branch restenosis, reduced from 60% to under 20% (Dr. Erlene Quan)   CORONARY ARTERY BYPASS GRAFT  12/20/2009   LIMA-LAD, left radial-1st diagonal, SVG-PDA & PLA (Dr. Kathie Rhodes. Hendrickson)   HIP ARTHROSCOPY W/ LABRAL REPAIR     LEFT HEART CATHETERIZATION WITH CORONARY/GRAFT ANGIOGRAM N/A 10/03/2011   Procedure: LEFT HEART CATHETERIZATION WITH Isabel Caprice;  Surgeon: Runell Gess, MD;  Location: [facility] CATH LAB;  Service: Cardiovascular;  Laterality: N/A;   LOWER EXTREMITY ARTERIAL DOPPLER  11/2005   normal study    NM MYOCAR IMG MI  04/2011   bruce myoview - normal perfusion; EF 58%; low risk scan   PERIPHERAL VASCULAR INTERVENTION  03/28/2021   Procedure: PERIPHERAL VASCULAR INTERVENTION;  Surgeon: Runell Gess, MD;  Location: [facility];  Service: Cardiovascular;;  Right common iliac stent   RENAL DOPPLER  12/2012   right renal stent w/1-59% diameter reduction   TRANSTHORACIC ECHOCARDIOGRAM  04/2011   EF=>55%; trace MR; mild TR   Patient Active Problem List   Diagnosis Date Noted  Traumatic subarachnoid hemorrhage (HCC) 10/24/2023   Peripheral arterial disease (HCC) 03/11/2021   Numbness and tingling of right leg 01/27/2021   Costochondritis 11/15/2020   Metabolic syndrome 11/11/2020   Prediabetes 10/18/2020   Class 1 obesity with serious comorbidity and body mass index (BMI) of 32.0 to 32.9 in adult 10/18/2020   Essential hypertension 07/02/2018   Renal artery stenosis (HCC) 07/24/2013   Carotid artery disease (HCC) 07/24/2013   Angina at rest [facility]) 10/02/2011   CAD (coronary artery disease), with CABG Lima to LAD, Lt. radial artery to 1st diagnal, sequential SVG to PDA and PLA, 12/20/09 10/02/2011   Hyperlipidemia, controlled 10/02/2011   Situational mixed anxiety  and depressive disorder,  10/02/2011    ONSET DATE: 10/26/2023 (Date of referral)    REFERRING DIAG: S06.6XAA (ICD-10-CM) - Traumatic subarachnoid hemorrhage with unknown loss of consciousness status, initial encounter  THERAPY DIAG: Cognitive communication deficit  Rationale for Evaluation and Treatment: Rehabilitation  SUBJECTIVE:   SUBJECTIVE STATEMENT: "I' have a headache" Pt accompanied by: friend Kim  PERTINENT HISTORY: "68 y/o male presents to [facility] on 2/12 after a ground level fall when playing tennis (+LOC with possible posturing for 2 minutes). Scans revealed a traumatic subarachnoid hemorrhage and an occipital fx . PMH includes: PAD, CAD (s/p CABG in 2011), HLD, HTN, and OSA, R THA"   PAIN: Are you having pain? Yes: NPRS scale: 4/10 Pain location: neck, head Pain description: ache Aggravating factors: movement Relieving factors: meds  FALLS: Has patient fallen in last 6 months?  Yes, Comment: fell while playing tennis resulting in [facility]  LIVING ENVIRONMENT: Lives with: lives with their family Lives in: House/apartment  PLOF:  Level of assistance: Independent with ADLs, Independent with IADLs Employment: Environmental education officer employment Economist)  PATIENT GOALS: "my reactions, being frustrated. Be able to handle every day tasks"  OBJECTIVE:  Note: Objective measures were completed at Evaluation unless otherwise noted.   STANDARDIZED ASSESSMENTS: CLQT initiated but terminated d/t brain fog and frustration  To be completed next session Electronics engineer, mazes, design generation) as time allows   PATIENT REPORTED OUTCOME MEASURES (PROM):  Cognitive Function PROM: 113/140 [name] rated a 3 or somewhat difficulty planning for and keeping appointments, getting organized, recalling 4-5 errands, words being on the tip of his tongue, recalling numbers, getting started on simple tasks, planning out steps of a task                                                               TREATMENT DATE:   11/19/23: Cognitive Function PROM completed - See above. [name] continues to reduce initiation on mail and [employer], endorses clutter. We generated strategy of having a daily to do list and written priorities for each week and to set a timer to 15-20 minutes, work for the set time and take a break. Education provided re: energy conservation and management of high sensory environments.   11/13/23: Initiated education and instruction of cognitively engaging tasks for home, including re/learning new music and reading. Recommended patient read instructions/information aloud to aid attention to detail given error exhibited on clock drawing. Pt and friend verbalized understanding.   PATIENT EDUCATION: Education details: see above Person educated: Patient and Friend Education method: Explanation Education comprehension: verbalized understanding, returned demonstration, and needs further  education   GOALS: Goals reviewed with patient? Yes  SHORT TERM GOALS: Target date: 12/11/2023  Complete CLQT and/or cognitive PROM (add goals as needed) Baseline: Goal status: ONGOING  2.  Pt will ID appropriate strategy/compensation for individual cognitive challenges x2 given rare min A Baseline:  Goal status: ONGOING  3.  Pt will utilize cognitive strategies to optimize performance on simulated household tasks given rare min A  Baseline:  Goal status: ONGOING  4.  Pt will utilize cognitive strategies to optimize performance on simulated work tasks given rare min A  Baseline:  Goal status: ONGOING   LONG TERM GOALS: Target date: 01/08/2024  Pt will report successful carryover of cognitive strategies at home/work with mod I  Baseline:  Goal status: ONGONG  2.  Pt will demonstrate awareness of potential cognitive challenges and teach back appropriate strategy to optimize performance  Baseline:  Goal status: ONGOING  3.  Pt will report improved cognitive functioning via PROM  by LTG date  Baseline: complete 1st session  Goal status:ONGOING   ASSESSMENT:  CLINICAL IMPRESSION: Patient is a 68 y.o. M who was seen today for speech therapy evaluation following SAH d/t fall. Endorsed mild memory changes and delayed processing since fall. Has not challenged self to return to many prior tasks involving higher level cognitive skills at this time. CLQT initiated today, which revealed mildly reduced working memory for multi-step task and mildly reduced attention to detail. Demonstrated overt frustration with testing performance and concern for return to prior baseline. Provided recommendations to optimize current cognitive functioning at home. Pt would benefit from skilled ST intervention to optimize return to cognitive baseline.   OBJECTIVE IMPAIRMENTS: include attention, memory, and executive functioning. These impairments are limiting patient from return to work and ADLs/IADLs. Factors affecting potential to achieve goals and functional outcome are  None . Patient will benefit from skilled SLP services to address above impairments and improve overall function.  REHAB POTENTIAL: Good  PLAN:  SLP FREQUENCY: 1-2x/week  SLP DURATION: 8 weeks  PLANNED INTERVENTIONS: Environmental controls, Cueing hierachy, Cognitive reorganization, Functional tasks, SLP instruction and feedback, Compensatory strategies, Patient/family education, and 16109 Treatment of speech (30 or 45 min)     Eilene Voigt, Radene Journey, CCC-SLP 11/19/2023, 2:18 PM

## 2023-11-20 ENCOUNTER — Ambulatory Visit: Payer: 59 | Admitting: Occupational Therapy

## 2023-11-20 ENCOUNTER — Ambulatory Visit: Payer: 59

## 2023-11-20 DIAGNOSIS — R2689 Other abnormalities of gait and mobility: Secondary | ICD-10-CM

## 2023-11-20 DIAGNOSIS — M6281 Muscle weakness (generalized): Secondary | ICD-10-CM

## 2023-11-20 DIAGNOSIS — R2681 Unsteadiness on feet: Secondary | ICD-10-CM

## 2023-11-20 DIAGNOSIS — R42 Dizziness and giddiness: Secondary | ICD-10-CM

## 2023-11-20 DIAGNOSIS — R6889 Other general symptoms and signs: Secondary | ICD-10-CM

## 2023-11-20 DIAGNOSIS — R29818 Other symptoms and signs involving the nervous system: Secondary | ICD-10-CM

## 2023-11-20 NOTE — Therapy (Signed)
 OUTPATIENT OCCUPATIONAL THERAPY NEURO TREATMENT  Patient Name: Maurice Park MRN: [id number] DOB:1955/10/10, 68 y.o., male Today's Date: 11/20/2023  PCP: Lupita Raider, MD  REFERRING PROVIDER: Juliet Rude, PA-C  END OF SESSION:  OT End of Session - 11/20/23 0935     Visit Number 4    Number of Visits 13    Date for OT Re-Evaluation 12/21/23    Authorization Type [employer]    OT Start Time [phone]    OT Stop Time 1016    OT Time Calculation (min) 38 min    Activity Tolerance Patient tolerated treatment well    Behavior During Therapy [facility] for tasks assessed/performed             Past Medical History:  Diagnosis Date   Angina    Anxiety    Back pain    CAD (coronary artery disease), with CABG [place] to LAD, Lt. radial artery to 1st diagnal, sequential SVG to PDA and PLA, 12/20/09 April 2011   Carotid artery disease (HCC)    status post right carotid endarterectomy   Chest pain    Dysrhythmia    History of heart attack    Hyperlipidemia, controlled    Hypertension    Joint pain    Lactose intolerance    Palpitations    Peripheral vascular disease (HCC)    Renal artery stenosis (HCC)    Right hip pain    Situational mixed anxiety and depressive disorder,     Sleep apnea    SOB (shortness of breath)    Past Surgical History:  Procedure Laterality Date   ABDOMINAL AORTOGRAM W/LOWER EXTREMITY N/A 03/28/2021   Procedure: ABDOMINAL AORTOGRAM W/LOWER EXTREMITY;  Surgeon: Runell Gess, MD;  Location: [facility] INVASIVE CV [facility];  Service: Cardiovascular;  Laterality: N/A;  limited runoff   ANTERIOR CRUCIATE LIGAMENT REPAIR Left    CARDIAC CATHETERIZATION  12/14/2009   multivessel CAD >> CABG (Dr. Nicki Guadalajara)   CARDIAC CATHETERIZATION  10/03/2011   L Cfx w/80% ostial stenosis, ramus with 50% segmental mid stenosis; RCA dominant & occluded in midportion; LIMA to LAD patent; SVG to PDA/PLA patent; free L radial to diagonal functionally occluded (Dr. Erlene Quan)   CAROTID DOPPLER   11/2011   R CEA w/normal patency; left bulb w/0-49% diameter reduction   CAROTID ENDARTERECTOMY  05/03/2006   Dr. Tawanna Cooler Early   CORONARY ANGIOPLASTY  02/21/2000   PTCA with 3.25x64mm Quantum Monorail balloon of in-stent posterolateral branch restenosis, reduced from 60% to under 20% (Dr. Erlene Quan)   CORONARY ARTERY BYPASS GRAFT  12/20/2009   LIMA-LAD, left radial-1st diagonal, SVG-PDA & PLA (Dr. Kathie Rhodes. Hendrickson)   HIP ARTHROSCOPY W/ LABRAL REPAIR     LEFT HEART CATHETERIZATION WITH CORONARY/GRAFT ANGIOGRAM N/A 10/03/2011   Procedure: LEFT HEART CATHETERIZATION WITH Isabel Caprice;  Surgeon: Runell Gess, MD;  Location: [facility] CATH LAB;  Service: Cardiovascular;  Laterality: N/A;   LOWER EXTREMITY ARTERIAL DOPPLER  11/2005   normal study    NM MYOCAR IMG MI  04/2011   bruce myoview - normal perfusion; EF 58%; low risk scan   PERIPHERAL VASCULAR INTERVENTION  03/28/2021   Procedure: PERIPHERAL VASCULAR INTERVENTION;  Surgeon: Runell Gess, MD;  Location: [facility] INVAS[facility] CV [facility];  Service: Cardiovascular;;  Right common iliac stent   RENAL DOPPLER  12/2012   right renal stent w/1-59% diameter reduction   TRANSTHORACIC ECHOCARDIOGRAM  04/2011   EF=>55%; trace MR; mild TR   Patient Active Problem List  Diagnosis Date Noted   Traumatic subarachnoid hemorrhage (HCC) 10/24/2023   Peripheral arterial disease (HCC) 03/11/2021   Numbness and tingling of right leg 01/27/2021   Costochondritis 11/15/2020   Metabolic syndrome 11/11/2020   Prediabetes 10/18/2020   Class 1 obesity with serious comorbidity and body mass index (BMI) of 32.0 to 32.9 in adult 10/18/2020   Essential hypertension 07/02/2018   Renal artery stenosis (HCC) 07/24/2013   Carotid artery disease (HCC) 07/24/2013   Angina at rest [facility]) 10/02/2011   CAD (coronary artery disease), with CABG [place] to LAD, Lt. radial artery to 1st diagnal, sequential SVG to PDA and PLA, 12/20/09 10/02/2011   Hyperlipidemia, controlled  10/02/2011   Situational mixed anxiety and depressive disorder,  10/02/2011   ONSET DATE: 10/26/2023 (Date of referral)  REFERRING DIAG: S06.6XAA (ICD-10-CM) - Traumatic subarachnoid hemorrhage with unknown loss of consciousness status, initial encounter   THERAPY DIAG:  Muscle weakness (generalized)  Other symptoms and signs involving the nervous system  Decreased functional activity tolerance  Rationale for Evaluation and Treatment: Rehabilitation  SUBJECTIVE:   SUBJECTIVE STATEMENT: He was able to play 7 songs at church this past Sunday.   Pt accompanied by: friend - Kim, also OT  PERTINENT HISTORY: PMH includes: PAD, CAD (s/p CABG in 2011), HLD, HTN, and OSA, R THA   Pt admitted to [facility] on 10/24/2023 after a ground level fall when playing tennis. Scans revealed a traumatic subarachnoid hemorrhage and an occipital fx   PRECAUTIONS: Fall and Other: concussion  WEIGHT BEARING RESTRICTIONS: No  PAIN:  Are you having pain? Yes: NPRS scale: 2/10 Pain location: LLE Pain description: sore Aggravating factors: fatigue Relieving factors: rest, medications  FALLS: Has patient fallen in last 6 months? Yes. Number of falls 1 - fell when playing tennis, resulting in [facility]  LIVING ENVIRONMENT: Family/patient expects to be discharged to:: Private residence Living Arrangements: Spouse/significant other Available Help at Discharge: Family Type of Home: House Home Access: Other (comment) (2 steps in house, no railings to support) Home Layout: One level Bathroom Shower/Tub: Health visitor: Handicapped height Bathroom Accessibility: Yes Home Equipment: Crutches;Shower seat - built in;Grab bars - tub/shower Additional Comments: dog that is blind 73 yo  Lives With: Spouse    PLOF: Independent; was driving; Electronics engineer with lots of computer, phone, and video chats  PATIENT GOALS: return to playing guitar and outdoor work  OBJECTIVE:  Note:  Objective measures were completed at Evaluation unless otherwise noted.  HAND DOMINANCE: Right  ADLs: Eating: is cautious around sharp items such as a knife Grooming: unable to bend forward to spit into sink UB Dressing: I LB Dressing: requires help to don shoes and socks as reaching provokes symptoms Toileting: mod I Bathing: mod I Equipment: Grab bars, Walk in shower, Reacher, and HHSH  IADLs: Shopping: has not tried Light housekeeping: light housekeeping only Meal Prep: dependent Community mobility: dependent Medication management: mod I Financial management: mod I Handwriting:  no change  MOBILITY STATUS: Needs Assist: Rollator for long distances, cane in home  ACTIVITY TOLERANCE: Activity tolerance: poor  FUNCTIONAL OUTCOME MEASURES: PSFS: 1.3 total score   Total score = sum of the activity scores/number of activities Minimum detectable change (90%CI) for average score = 2 points Minimum detectable change (90%CI) for single activity score = 3 points  UPPER EXTREMITY ROM and MMT:    BUE: WNL  HAND FUNCTION: Grip strength: Right: 72.3 lbs; Left: 69.6 lbs  COORDINATION: 9 Hole Peg test: Right: 25 sec;  Left: 22 sec  SENSATION: No paresthesias in hands  EDEMA: none reported or observed  MUSCLE TONE: BUE WFL  COGNITION: Overall cognitive status: Within functional limits for tasks assessed  VISION: Subjective report: no change noticed other than fatigue Baseline vision: Bifocals, Wears glasses all the time, and progressive lenses Visual history:  none  VISION ASSESSMENT: WFL  PERCEPTION: Not tested  PRAXIS: Not tested  OBSERVATIONS: Pt appears well-kept though keeps eyes closed off and on throughout visit. Dons glasses for near activities and doffs for the remainder of the time. Ambulates with use of rollator and has cane with him. Pt demonstrating guarding of neck while turning to sides but brings head to objects putting neck into flexion.                                                                                                                            TREATMENT :    OT educated pt on progressive muscle relaxation technique.   Pt was directed to use apps such as Breathe on watch and/or Calm to help with stress reduction as needed to reduce pain and improve sleep.   Therapist reviewed goals with patient and updated patient progression.  No additional functional limitations identified.  PATIENT EDUCATION: Education details:  see above Person educated: Patient and friend Education method: Explanation, Demonstration, and Verbal cues Education comprehension: verbalized understanding, returned demonstration, verbal cues required, and needs further education  HOME EXERCISE PROGRAM: 11/13/2023: visual scanning activities  GOALS:  SHORT TERM GOALS: Target date: 12/05/2023    Patient will demonstrate HEP with visual handouts only for proper execution.  Baseline: Goal status: INITIAL  2.  Pt will verbalize understanding of stress reduction techniques as needed to promote needed rest following concussion/SDH.  Baseline:  Goal status: MET  3.  Pt will return demonstration of LB dressing with modifications/use of AD as needed to improve occupational performance.  Baseline:  Goal status: MET   LONG TERM GOALS: Target date: 12/21/2023  Patient will report at least two-point increase in average PSFS score or at least three-point increase in a single activity score indicating functionally significant improvement given minimum detectable change.  Baseline: 1.3 total score (See above for individual activity scores)  Goal status: INITIAL   ASSESSMENT:  CLINICAL IMPRESSION: Patient has met 2/3 STGs this visit. Progressing well towards goals and demonstrates good understanding of recommendations as needed to further progress towards goals.   PERFORMANCE DEFICITS: in functional skills including ADLs, IADLs, pain, muscle spasms, body  mechanics, endurance, decreased knowledge of use of DME, vision, and vestibular.   IMPAIRMENTS: are limiting patient from ADLs, IADLs, rest and sleep, work, leisure, and social participation.   CO-MORBIDITIES: may have co-morbidities  that affects occupational performance. Patient will benefit from skilled OT to address above impairments and improve overall function.  REHAB POTENTIAL: Good  PLAN:  OT FREQUENCY: 1-2x/week  OT DURATION: 6 weeks  PLANNED INTERVENTIONS: 16109 OT Re-evaluation, 97535 self care/ADL training,  97110 therapeutic exercise, 97530 therapeutic activity, 97112 neuromuscular re-education, functional mobility training, visual/perceptual remediation/compensation, energy conservation, coping strategies training, patient/family education, and DME and/or AE instructions  RECOMMENDED OTHER SERVICES: N/A for this visit - pt has ST and PT referrals  CONSULTED AND AGREED WITH PLAN OF CARE: Patient and friend  PLAN FOR NEXT SESSION: Review goals; scanning activities; activity modification, D/C?   Delana Meyer, OT 11/20/2023, 9:39 AM

## 2023-11-20 NOTE — Therapy (Signed)
 OUTPATIENT PHYSICAL THERAPY NEURO TREATMENT   Patient Name: Maurice Park MRN: [id number] DOB:[birth year], 68 y.o., male Today's Date: 11/20/2023   PCP: Maurice Raider, MD REFERRING PROVIDER: Juliet Rude, PA-C  END OF SESSION:  PT End of Session - 11/20/23 0942     Visit Number 4    Number of Visits 13    Date for PT Re-Evaluation 01/04/24    Authorization Type [employer]    PT Start Time 1016    PT Stop Time 1100    PT Time Calculation (min) 44 min    Activity Tolerance Patient tolerated treatment well    Behavior During Therapy WFL for tasks assessed/performed               Past Medical History:  Diagnosis Date   Angina    Anxiety    Back pain    CAD (coronary artery disease), with CABG [place] to LAD, Lt. radial artery to 1st diagnal, sequential SVG to PDA and PLA, 12/20/09 April 2011   Carotid artery disease (HCC)    status post right carotid endarterectomy   Chest pain    Dysrhythmia    History of heart attack    Hyperlipidemia, controlled    Hypertension    Joint pain    Lactose intolerance    Palpitations    Peripheral vascular disease (HCC)    Renal artery stenosis (HCC)    Right hip pain    Situational mixed anxiety and depressive disorder,     Sleep apnea    SOB (shortness of breath)    Past Surgical History:  Procedure Laterality Date   ABDOMINAL AORTOGRAM W/LOWER EXTREMITY N/A 03/28/2021   Procedure: ABDOMINAL AORTOGRAM W/LOWER EXTREMITY;  Surgeon: Maurice Gess, MD;  Location: [facility];  Service: Cardiovascular;  Laterality: N/A;  limited runoff   ANTERIOR CRUCIATE LIGAMENT REPAIR Left    CARDIAC CATHETERIZATION  12/14/2009   multivessel CAD >> CABG (Dr. Nicki Park)   CARDIAC CATHETERIZATION  10/03/2011   L Cfx w/80% ostial stenosis, ramus with 50% segmental mid stenosis; RCA dominant & occluded in midportion; LIMA to LAD patent; SVG to PDA/PLA patent; free L radial to diagonal functionally occluded (Dr. Erlene Park)   CAROTID DOPPLER   11/2011   R CEA w/normal patency; left bulb w/0-49% diameter reduction   CAROTID ENDARTERECTOMY  05/03/2006   Dr. Tawanna Cooler Park   CORONARY ANGIOPLASTY  02/21/2000   PTCA with 3.25x28mm Quantum Monorail balloon of in-stent posterolateral branch restenosis, reduced from 60% to under 20% (Dr. Erlene Park)   CORONARY ARTERY BYPASS GRAFT  12/20/2009   LIMA-LAD, left radial-1st diagonal, SVG-PDA & PLA (Dr. Kathie Rhodes. Park)   HIP ARTHROSCOPY W/ LABRAL REPAIR     LEFT HEART CATHETERIZATION WITH CORONARY/GRAFT ANGIOGRAM N/A 10/03/2011   Procedure: LEFT HEART CATHETERIZATION WITH Maurice Park;  Surgeon: Maurice Gess, MD;  Location: [facility] CATH LAB;  Service: Cardiovascular;  Laterality: N/A;   LOWER EXTREMITY ARTERIAL DOPPLER  11/2005   normal study    NM MYOCAR IMG MI  04/2011   bruce myoview - normal perfusion; EF 58%; low risk scan   PERIPHERAL VASCULAR INTERVENTION  03/28/2021   Procedure: PERIPHERAL VASCULAR INTERVENTION;  Surgeon: Maurice Gess, MD;  Location: [facility];  Service: Cardiovascular;;  Right common iliac stent   RENAL DOPPLER  12/2012   right renal stent w/1-59% diameter reduction   TRANSTHORACIC ECHOCARDIOGRAM  04/2011   EF=>55%; trace MR; mild TR   Patient Active Problem  List   Diagnosis Date Noted   Traumatic subarachnoid hemorrhage (HCC) 10/24/2023   Peripheral arterial disease (HCC) 03/11/2021   Numbness and tingling of right leg 01/27/2021   Costochondritis 11/15/2020   Metabolic syndrome 11/11/2020   Prediabetes 10/18/2020   Class 1 obesity with serious comorbidity and body mass index (BMI) of 32.0 to 32.9 in adult 10/18/2020   Essential hypertension 07/02/2018   Renal artery stenosis (HCC) 07/24/2013   Carotid artery disease (HCC) 07/24/2013   Angina at rest [facility]) 10/02/2011   CAD (coronary artery disease), with CABG [place] to LAD, Lt. radial artery to 1st diagnal, sequential SVG to PDA and PLA, 12/20/09 10/02/2011   Hyperlipidemia, controlled  10/02/2011   Situational mixed anxiety and depressive disorder,  10/02/2011    ONSET DATE: 10/26/2023 (referral date)  REFERRING DIAG: S06.6XAA (ICD-10-CM) - Traumatic subarachnoid hemorrhage with unknown loss of consciousness status, initial encounter (HCC)  THERAPY DIAG:  Muscle weakness (generalized)  Other abnormalities of gait and mobility  Dizziness and giddiness  Unsteadiness on feet  Rationale for Evaluation and Treatment: Rehabilitation  SUBJECTIVE:                                                                                                                                                                                             SUBJECTIVE STATEMENT: "Maurice Park" Patient received from OT. Reports dizziness has improved slightly. It still occurs when he lays down flat.   Pt accompanied by: self and friend (friend Maurice Batten)  PERTINENT HISTORY:  PMH includes: PAD, CAD (s/p CABG in 2011), HLD, HTN, and OSA, R THA  Pt admitted to [facility] on 10/24/2023 after a ground level fall when playing tennis. Scans revealed a traumatic subarachnoid hemorrhage and an occipital fx.   PAIN:  Are you having pain? 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  DIAGNOSTIC FINDINGS:  Cervical MRI 11/08/23, pending results  Cervical Spine Xray 10/25/23 IMPRESSION: 1. No acute findings. 2. Mild retrolisthesis at C5-6 without  dynamic instability.  Head CT 10/24/13 FINDINGS: Brain: Scattered subarachnoid hemorrhage along the cerebral convexities, left sylvian fissure, and inter peduncular cistern is diffusely and mildly  increased. Some of this change is related to redistribution with left subarachnoid blood seen along the inferior frontal lobes. Suspect mild inferior frontal lobe hemorrhage and edema from contusion, hemorrhage on the left measuring up to 5 mm. The pattern is compatible with history of head trauma. No evidence of infarct, hydrocephalus, or mass   Vascular: No hyperdense vessel or unexpected calcification.   Skull: Vertical, nondisplaced right occipital bone fracture, known.   Sinuses/Orbits: Generalized mucosal thickening in the ethmoid, sphenoid, and frontal sinuses.   IMPRESSION: 1. Scattered subarachnoid hemorrhage with mild increase/redistribution from yesterday. 2. Possible small inferior frontal lobe contusions. 3. Known occipital bone fracture that is nondepressed.  Head CT 10/30/2023 IMPRESSION: 1. Unchanged appearance of small anterior frontal lobe hemorrhagic contusions. 2. Unchanged appearance of nondisplaced right occipital fracture.  Vestibular Asssessment   General Observation: NAD, no AD    Symptom Behavior:   Subjective history: related to occipital SDH   Non-Vestibular symptoms:  none   Type of dizziness:  "disoriented"   Frequency: daily    Duration: seconds   Aggravating factors: Induced by position change: lying supine and Induced by motion: looking up at the ceiling, bending down to the ground, and turning head quickly   Relieving factors: rest and slow movements   Progression of symptoms: better   Oculomotor Exam:   Ocular Alignment: normal   Ocular ROM: No Limitations   Spontaneous Nystagmus: absent   Gaze-Induced Nystagmus: absent   Smooth Pursuits: intact   Saccades: intact   Convergence/Divergence: ~8 cm    Vestibular-Ocular Reflex  (VOR):   Slow VOR: Positive Right   VOR Cancellation: Corrective Saccades to the R   Head-Impulse Test: HIT Right: positive HIT Left: negative   Dynamic Visual Acuity: TBA PRN    Positional Testing: Right Dix-Hallpike: upbeating, right nystagmus Left Dix-Hallpike: no nystagmus Right Roll Test: no nystagmus Left Roll Test: no nystagmus    Motion Sensitivity:   Motion Sensitivity Quotient  Intensity: 0 = none, 1 = Lightheaded, 2 = Mild, 3 = Moderate, 4 = Severe, 5 = Vomiting Duration: < 5 s = 0, 5-10s = 1,11-30s = 2, >30s = 3 Score = Intensity + duration     Intensity Duration Score  1. Sitting to supine 0    2. Supine to L side 1    3. Supine to R side 2.5    4. Supine to sitting 3    5. L Hallpike-Dix 0    6. Up from L  1    7. R Hallpike-Dix 3    8. Up from R  3    9. Sitting, head  tipped to L knee     10. Head up from L  knee     11. Sitting, head  tipped to R knee     12. Head up from R  knee     13. Sitting head turns x5     14.Sitting head nods x5     15. In stance, 180  turn to L      16. In stance, 180  turn to R      MSQ = Total score  (# of positions) / 20.48 MSQ = __________________  0-10 mild; 11-30 moderate; 31-100 severe   Vestibular Treatment   Canalith Repositioning:   Epley Right: Number of Reps: 1 and Response to Treatment: symptoms resolved     Gaze Adaptation:   x1 Viewing Horizontal: Position: sitting and Time: 30s  Horizontal saccades L eye  occluded     PATIENT EDUCATION: Education details: exam findings, additions to HEP Person educated: Patient and friend Kim Education method: Medical illustrator Education comprehension: verbalized understanding, returned demonstration, and needs further education  HOME EXERCISE PROGRAM: Access Code: [email or web address] URL: [email or web address] Date: 11/12/2023 Prepared by: Peter Congo  Exercises - Seated Hamstring Stretch with Strap  - 1 x daily - 7 x weekly - 1  sets - 3-5 reps - 30-60 hold - Hamstring Mobilization with Foam Roll  - 1-2 x daily - 7 x weekly - 1 sets - 10 reps - Gluteus Mobilization with Foam Roll  - 1-2 x daily - 7 x weekly - 1 sets - 10 reps - Clamshell  - 1 x daily - 7 x weekly - 3 sets - 5-10 reps - Sidelying Diagonal Hip Abduction  - 1 x daily - 7 x weekly - 3 sets - 5 reps   Keeping eyes on target on wall 5 feet away, tilt head down 15-30 and move head side to side for __30__ seconds. Repeat while moving head up and down for __30__ seconds. Do __3__ sessions per day. Oculomotor: Saccades    Holding two targets positioned side by side ___6_ inches apart, move eyes quickly from target to target as head stays still.  Perform sitting.  Do ___3_ sessions per day.  GOALS: Goals reviewed with patient? Yes  SHORT TERM GOALS: Target date: 11/30/2023  Pt will be independent with initial HEP for improved strength, balance, transfers and gait and management of his pain symptoms. Baseline: Goal status: INITIAL  2.  Pt will improve gait velocity to at least 2.75 ft/sec for improved gait efficiency and performance at SBA level  Baseline: 2.45 ft/sec no AD (2/28) Goal status: INITIAL  3.  Pt will improve 5 x STS to less than or equal to 17 seconds to demonstrate improved functional strength and transfer efficiency.  Baseline: 20 sec no UE (2/28) Goal status: INITIAL  4.  Pt will improve FGA to 16/30 for decreased fall risk  Baseline: 12/30 (2/28) Goal status: INITIAL  5.  Vestibular system to be assessed and LTG set as appropriate Baseline:  Goal status: INITIAL   LONG TERM GOALS: Target date: 12/21/2023   Pt will be independent with final HEP for improved strength, balance, transfers and gait and management of his pain symptoms. Baseline:  Goal status: INITIAL  2.  Pt will improve gait velocity to at least 3.0 ft/sec for improved gait efficiency and performance at mod I level  Baseline: 2.45 ft/sec no AD (2/28) Goal  status: INITIAL  3.  Pt will improve 5 x STS to less than or equal to 14 seconds to demonstrate improved functional strength and transfer efficiency.  Baseline: 20 sec no UE (2/28) Goal status: INITIAL  4.  Pt will improve FGA to 20/30 for decreased fall risk  Baseline: 12/30 (2/28) Goal status: INITIAL  5.  Pt will report </= 1/5 for all movements on MSQ to indicate improvement in motion sensitivity and improved activity tolerance.   Baseline: 3/5 Goal status: REVISED   ASSESSMENT:  CLINICAL IMPRESSION: Patient seen for skilled PT session with emphasis on vestibular assessment and tx. 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 Treated with Epley x1 and resolution of symptoms. Patient with suppressed R eye with convergence, which is also larger than anticipated. This has impacted his ability to  complete job-related functions. He would benefit from skilled PT services to address the above mentioned deficits.    OBJECTIVE IMPAIRMENTS: Abnormal gait, decreased activity tolerance, decreased knowledge of condition, decreased mobility, difficulty walking, decreased ROM, decreased strength, dizziness, impaired perceived functional ability, increased muscle spasms, impaired vision/preception, and pain.   ACTIVITY LIMITATIONS: carrying, lifting, bending, standing, stairs, transfers, and bed mobility  PARTICIPATION LIMITATIONS: meal prep, cleaning, interpersonal relationship, driving, occupation, and yard work  PERSONAL FACTORS: 3+ comorbidities:   PAD, CAD (s/p CABG in 2011), HLD, HTN, and OSA, R THAare also affecting patient's functional outcome.   REHAB POTENTIAL: Good  CLINICAL DECISION MAKING: Evolving/moderate complexity  EVALUATION COMPLEXITY: High  PLAN:  PT FREQUENCY: 1-2x/week  PT DURATION: 6 weeks  PLANNED INTERVENTIONS: 97164- PT Re-evaluation,  97110-Therapeutic exercises, 97530- Therapeutic activity, 97112- Neuromuscular re-education, 97535- Self Care, 16109- Manual therapy, [phone]- Gait training, [phone]- Electrical stimulation (manual), Patient/Family education, Balance training, Stair training, Taping, Dry Needling, Joint mobilization, Spinal mobilization, Vestibular training, Visual/preceptual remediation/compensation, DME instructions, Cryotherapy, and Moist heat  PLAN FOR NEXT SESSION: results of cervical MRI and assess cervical ROM?, add to HEP to address L HS and glute tightness, R hip weakness, balance impairments based on FGA (head turns, pivots/turns, stepping over obstacles); convergence tasks (straw in cup, brock string), L eye occluded saccades, [place] eye tracking (all completed with standing balance challenge such as on airex)  Westley Foots, PT Westley Foots, PT, DPT, CBIS  11/20/2023, 11:52 AM

## 2023-11-21 ENCOUNTER — Ambulatory Visit: Admitting: Speech Pathology

## 2023-11-21 ENCOUNTER — Encounter: Payer: Self-pay | Admitting: Speech Pathology

## 2023-11-21 DIAGNOSIS — M6281 Muscle weakness (generalized): Secondary | ICD-10-CM | POA: Diagnosis not present

## 2023-11-21 DIAGNOSIS — R41841 Cognitive communication deficit: Secondary | ICD-10-CM

## 2023-11-21 NOTE — Patient Instructions (Addendum)
   Consider reading some work type materials if it won't stress you out too much  Print them so you are not looking at the computer to read work related materials  Make sure you are doing the OT and PT exercises the prescribed amount of times a day  As you improve you can add to your time - gradually increase time from 15-20 minutes to 25-30 as you are able - keep gradually increasing time weekly as you are able

## 2023-11-21 NOTE — Therapy (Signed)
 OUTPATIENT SPEECH LANGUAGE PATHOLOGY TREATMENT   Patient Name: Maurice Park MRN: [id number] DOB:[birth year], 68 y.o., male Today's Date: 11/21/2023  PCP: Lupita Raider MD REFERRING PROVIDER: Juliet Rude, PA-C (Doc sent to PCP above)  END OF SESSION:  End of Session - 11/21/23 1106     Visit Number 3    Number of Visits 17    Date for SLP Re-Evaluation 01/08/24    Authorization Type UHC    SLP Start Time 1100    SLP Stop Time  1145    SLP Time Calculation (min) 45 min    Activity Tolerance Patient tolerated treatment well             Past Medical History:  Diagnosis Date   Angina    Anxiety    Back pain    CAD (coronary artery disease), with CABG Lima to LAD, Lt. radial artery to 1st diagnal, sequential SVG to PDA and PLA, 12/20/09 April 2011   Carotid artery disease (HCC)    status post right carotid endarterectomy   Chest pain    Dysrhythmia    History of heart attack    Hyperlipidemia, controlled    Hypertension    Joint pain    Lactose intolerance    Palpitations    Peripheral vascular disease (HCC)    Renal artery stenosis (HCC)    Right hip pain    Situational mixed anxiety and depressive disorder,     Sleep apnea    SOB (shortness of breath)    Past Surgical History:  Procedure Laterality Date   ABDOMINAL AORTOGRAM W/LOWER EXTREMITY N/A 03/28/2021   Procedure: ABDOMINAL AORTOGRAM W/LOWER EXTREMITY;  Surgeon: Runell Gess, MD;  Location: [facility];  Service: Cardiovascular;  Laterality: N/A;  limited runoff   ANTERIOR CRUCIATE LIGAMENT REPAIR Left    CARDIAC CATHETERIZATION  12/14/2009   multivessel CAD >> CABG (Dr. Nicki Guadalajara)   CARDIAC CATHETERIZATION  10/03/2011   L Cfx w/80% ostial stenosis, ramus with 50% segmental mid stenosis; RCA dominant & occluded in midportion; LIMA to LAD patent; SVG to PDA/PLA patent; free L radial to diagonal functionally occluded (Dr. Erlene Quan)   CAROTID DOPPLER  11/2011   R CEA w/normal patency;  left bulb w/0-49% diameter reduction   CAROTID ENDARTERECTOMY  05/03/2006   Dr. Tawanna Cooler Early   CORONARY ANGIOPLASTY  02/21/2000   PTCA with 3.25x69mm Quantum Monorail balloon of in-stent posterolateral branch restenosis, reduced from 60% to under 20% (Dr. Erlene Quan)   CORONARY ARTERY BYPASS GRAFT  12/20/2009   LIMA-LAD, left radial-1st diagonal, SVG-PDA & PLA (Dr. Kathie Rhodes. Hendrickson)   HIP ARTHROSCOPY W/ LABRAL REPAIR     LEFT HEART CATHETERIZATION WITH CORONARY/GRAFT ANGIOGRAM N/A 10/03/2011   Procedure: LEFT HEART CATHETERIZATION WITH Isabel Caprice;  Surgeon: Runell Gess, MD;  Location: [facility] CATH LAB;  Service: Cardiovascular;  Laterality: N/A;   LOWER EXTREMITY ARTERIAL DOPPLER  11/2005   normal study    NM MYOCAR IMG MI  04/2011   bruce myoview - normal perfusion; EF 58%; low risk scan   PERIPHERAL VASCULAR INTERVENTION  03/28/2021   Procedure: PERIPHERAL VASCULAR INTERVENTION;  Surgeon: Runell Gess, MD;  Location: [facility];  Service: Cardiovascular;;  Right common iliac stent   RENAL DOPPLER  12/2012   right renal stent w/1-59% diameter reduction   TRANSTHORACIC ECHOCARDIOGRAM  04/2011   EF=>55%; trace MR; mild TR   Patient Active Problem List   Diagnosis Date Noted  Traumatic subarachnoid hemorrhage (HCC) 10/24/2023   Peripheral arterial disease (HCC) 03/11/2021   Numbness and tingling of right leg 01/27/2021   Costochondritis 11/15/2020   Metabolic syndrome 11/11/2020   Prediabetes 10/18/2020   Class 1 obesity with serious comorbidity and body mass index (BMI) of 32.0 to 32.9 in adult 10/18/2020   Essential hypertension 07/02/2018   Renal artery stenosis (HCC) 07/24/2013   Carotid artery disease (HCC) 07/24/2013   Angina at rest [facility]) 10/02/2011   CAD (coronary artery disease), with CABG Lima to LAD, Lt. radial artery to 1st diagnal, sequential SVG to PDA and PLA, 12/20/09 10/02/2011   Hyperlipidemia, controlled 10/02/2011   Situational mixed anxiety  and depressive disorder,  10/02/2011    ONSET DATE: 10/26/2023 (Date of referral)    REFERRING DIAG: S06.6XAA (ICD-10-CM) - Traumatic subarachnoid hemorrhage with unknown loss of consciousness status, initial encounter  THERAPY DIAG: Cognitive communication deficit  Rationale for Evaluation and Treatment: Rehabilitation  SUBJECTIVE:   SUBJECTIVE STATEMENT: "We gave Kim the day off" Pt accompanied by: significant other Anna  PERTINENT HISTORY: "68 y/o male presents to [facility] on 2/12 after a ground level fall when playing tennis (+LOC with possible posturing for 2 minutes). Scans revealed a traumatic subarachnoid hemorrhage and an occipital fx . PMH includes: PAD, CAD (s/p CABG in 2011), HLD, HTN, and OSA, R THA"   PAIN: Are you having pain? Yes: NPRS scale: 4/10 Pain location: neck, head Pain description: ache Aggravating factors: movement Relieving factors: meds  FALLS: Has patient fallen in last 6 months?  Yes, Comment: fell while playing tennis resulting in [facility]  LIVING ENVIRONMENT: Lives with: lives with their family Lives in: House/apartment  PLOF:  Level of assistance: Independent with ADLs, Independent with IADLs Employment: Environmental education officer employment Economist)  PATIENT GOALS: "my reactions, being frustrated. Be able to handle every day tasks"  OBJECTIVE:  Note: Objective measures were completed at Evaluation unless otherwise noted.   STANDARDIZED ASSESSMENTS: CLQT initiated but terminated d/t brain fog and frustration  To be completed next session Electronics engineer, mazes, design generation) as time allows   PATIENT REPORTED OUTCOME MEASURES (PROM):  Cognitive Function PROM: 113/140 [name] rated a 3 or somewhat difficulty planning for and keeping appointments, getting organized, recalling 4-5 errands, words being on the tip of his tongue, recalling numbers, getting started on simple tasks, planning out steps of a task                                                               TREATMENT DATE:   11/21/23: [name], present for session. [name] has carried over strategies of taking breaks, setting time limits and using  relaxed breathing. He turned TV off to eliminate background noise and to support attention when bill paying.   He has generated a weekly to do list - [name] required verbal cues to look up and ID how many times a day he is to do PT and OT exercises (3x a day) - We added PT/OT exercises to to -do list. He has started with mail and bill organization using a timer. Ongoing education re: energy conservation and sensory over load strategies.   11/19/23: Cognitive Function PROM completed - See above. [name] continues to reduce initiation on mail and [employer], endorses clutter. We  generated strategy of having a daily to do list and written priorities for each week and to set a timer to 15-20 minutes, work for the set time and take a break. Education provided re: energy conservation and management of high sensory environments.   11/13/23: Initiated education and instruction of cognitively engaging tasks for home, including re/learning new music and reading. Recommended patient read instructions/information aloud to aid attention to detail given error exhibited on clock drawing. Pt and friend verbalized understanding.   PATIENT EDUCATION: Education details: see above Person educated: Patient and Friend Education method: Explanation Education comprehension: verbalized understanding, returned demonstration, and needs further education   GOALS: Goals reviewed with patient? Yes  SHORT TERM GOALS: Target date: 12/11/2023  Complete CLQT and/or cognitive PROM (add goals as needed) Baseline: Goal status: MET  2.  Pt will ID appropriate strategy/compensation for individual cognitive challenges x2 given rare min A Baseline:  Goal status: ONGOING  3.  Pt will utilize cognitive strategies to optimize performance on simulated household tasks given rare min  A  Baseline:  Goal status: ONGOING  4.  Pt will utilize cognitive strategies to optimize performance on simulated work tasks given rare min A  Baseline:  Goal status: ONGOING   LONG TERM GOALS: Target date: 01/08/2024  Pt will report successful carryover of cognitive strategies at home/work with mod I  Baseline:  Goal status: ONGONG  2.  Pt will demonstrate awareness of potential cognitive challenges and teach back appropriate strategy to optimize performance  Baseline:  Goal status: ONGOING  3.  Pt will report improved cognitive functioning via PROM by LTG date  Baseline: complete 1st session  Goal status:ONGOING   ASSESSMENT:  CLINICAL IMPRESSION: Patient is a 68 y.o. M who was seen today for speech therapy evaluation following SAH d/t fall. Endorsed mild memory changes and delayed processing since fall. Has not challenged self to return to many prior tasks involving higher level cognitive skills at this time. CLQT initiated today, which revealed mildly reduced working memory for multi-step task and mildly reduced attention to detail. Demonstrated overt frustration with testing performance and concern for return to prior baseline. Provided recommendations to optimize current cognitive functioning at home. Pt would benefit from skilled ST intervention to optimize return to cognitive baseline.   OBJECTIVE IMPAIRMENTS: include attention, memory, and executive functioning. These impairments are limiting patient from return to work and ADLs/IADLs. Factors affecting potential to achieve goals and functional outcome are  None . Patient will benefit from skilled SLP services to address above impairments and improve overall function.  REHAB POTENTIAL: Good  PLAN:  SLP FREQUENCY: 1-2x/week  SLP DURATION: 8 weeks  PLANNED INTERVENTIONS: Environmental controls, Cueing hierachy, Cognitive reorganization, Functional tasks, SLP instruction and feedback, Compensatory strategies,  Patient/family education, and 21308 Treatment of speech (30 or 45 min)     Kimberla Driskill, Radene Journey, CCC-SLP 11/21/2023, 11:53 AM

## 2023-11-22 ENCOUNTER — Ambulatory Visit: Payer: 59 | Admitting: Occupational Therapy

## 2023-11-22 ENCOUNTER — Ambulatory Visit: Payer: 59 | Admitting: Physical Therapy

## 2023-11-22 DIAGNOSIS — R29818 Other symptoms and signs involving the nervous system: Secondary | ICD-10-CM

## 2023-11-22 DIAGNOSIS — R6889 Other general symptoms and signs: Secondary | ICD-10-CM

## 2023-11-22 DIAGNOSIS — R41842 Visuospatial deficit: Secondary | ICD-10-CM

## 2023-11-22 DIAGNOSIS — M6281 Muscle weakness (generalized): Secondary | ICD-10-CM

## 2023-11-22 DIAGNOSIS — M79605 Pain in left leg: Secondary | ICD-10-CM

## 2023-11-22 DIAGNOSIS — R2681 Unsteadiness on feet: Secondary | ICD-10-CM

## 2023-11-22 DIAGNOSIS — M25551 Pain in right hip: Secondary | ICD-10-CM

## 2023-11-22 DIAGNOSIS — R2689 Other abnormalities of gait and mobility: Secondary | ICD-10-CM

## 2023-11-22 NOTE — Patient Instructions (Signed)

## 2023-11-22 NOTE — Therapy (Signed)
 OUTPATIENT OCCUPATIONAL THERAPY NEURO TREATMENT  Patient Name: Maurice Park MRN: [id number] DOB:[birth year], 68 y.o., male Today's Date: 11/22/2023  PCP: Lupita Raider, MD REFERRING PROVIDER: Juliet Rude, PA-C  END OF SESSION:  OT End of Session - 11/22/23 1017     Visit Number 5    Number of Visits 13    Date for OT Re-Evaluation 12/21/23    Authorization Type UHC    OT Start Time 1017    OT Stop Time 1100    OT Time Calculation (min) 43 min    Activity Tolerance Patient tolerated treatment well    Behavior During Therapy WFL for tasks assessed/performed             Past Medical History:  Diagnosis Date   Angina    Anxiety    Back pain    CAD (coronary artery disease), with CABG Lima to LAD, Lt. radial artery to 1st diagnal, sequential SVG to PDA and PLA, 12/20/09 April 2011   Carotid artery disease (HCC)    status post right carotid endarterectomy   Chest pain    Dysrhythmia    History of heart attack    Hyperlipidemia, controlled    Hypertension    Joint pain    Lactose intolerance    Palpitations    Peripheral vascular disease (HCC)    Renal artery stenosis (HCC)    Right hip pain    Situational mixed anxiety and depressive disorder,     Sleep apnea    SOB (shortness of breath)    Past Surgical History:  Procedure Laterality Date   ABDOMINAL AORTOGRAM W/LOWER EXTREMITY N/A 03/28/2021   Procedure: ABDOMINAL AORTOGRAM W/LOWER EXTREMITY;  Surgeon: Runell Gess, MD;  Location: [facility];  Service: Cardiovascular;  Laterality: N/A;  limited runoff   ANTERIOR CRUCIATE LIGAMENT REPAIR Left    CARDIAC CATHETERIZATION  12/14/2009   multivessel CAD >> CABG (Dr. Nicki Guadalajara)   CARDIAC CATHETERIZATION  10/03/2011   L Cfx w/80% ostial stenosis, ramus with 50% segmental mid stenosis; RCA dominant & occluded in midportion; LIMA to LAD patent; SVG to PDA/PLA patent; free L radial to diagonal functionally occluded (Dr. Erlene Quan)   CAROTID DOPPLER   11/2011   R CEA w/normal patency; left bulb w/0-49% diameter reduction   CAROTID ENDARTERECTOMY  05/03/2006   Dr. Tawanna Cooler Early   CORONARY ANGIOPLASTY  02/21/2000   PTCA with 3.25x63mm Quantum Monorail balloon of in-stent posterolateral branch restenosis, reduced from 60% to under 20% (Dr. Erlene Quan)   CORONARY ARTERY BYPASS GRAFT  12/20/2009   LIMA-LAD, left radial-1st diagonal, SVG-PDA & PLA (Dr. Kathie Rhodes. Hendrickson)   HIP ARTHROSCOPY W/ LABRAL REPAIR     LEFT HEART CATHETERIZATION WITH CORONARY/GRAFT ANGIOGRAM N/A 10/03/2011   Procedure: LEFT HEART CATHETERIZATION WITH Isabel Caprice;  Surgeon: Runell Gess, MD;  Location: [facility] CATH LAB;  Service: Cardiovascular;  Laterality: N/A;   LOWER EXTREMITY ARTERIAL DOPPLER  11/2005   normal study    NM MYOCAR IMG MI  04/2011   bruce myoview - normal perfusion; EF 58%; low risk scan   PERIPHERAL VASCULAR INTERVENTION  03/28/2021   Procedure: PERIPHERAL VASCULAR INTERVENTION;  Surgeon: Runell Gess, MD;  Location: [facility];  Service: Cardiovascular;;  Right common iliac stent   RENAL DOPPLER  12/2012   right renal stent w/1-59% diameter reduction   TRANSTHORACIC ECHOCARDIOGRAM  04/2011   EF=>55%; trace MR; mild TR   Patient Active Problem List   Diagnosis  Date Noted   Traumatic subarachnoid hemorrhage (HCC) 10/24/2023   Peripheral arterial disease (HCC) 03/11/2021   Numbness and tingling of right leg 01/27/2021   Costochondritis 11/15/2020   Metabolic syndrome 11/11/2020   Prediabetes 10/18/2020   Class 1 obesity with serious comorbidity and body mass index (BMI) of 32.0 to 32.9 in adult 10/18/2020   Essential hypertension 07/02/2018   Renal artery stenosis (HCC) 07/24/2013   Carotid artery disease (HCC) 07/24/2013   Angina at rest [facility]) 10/02/2011   CAD (coronary artery disease), with CABG Lima to LAD, Lt. radial artery to 1st diagnal, sequential SVG to PDA and PLA, 12/20/09 10/02/2011   Hyperlipidemia, controlled  10/02/2011   Situational mixed anxiety and depressive disorder,  10/02/2011   ONSET DATE: 10/26/2023 (Date of referral)  REFERRING DIAG: S06.6XAA (ICD-10-CM) - Traumatic subarachnoid hemorrhage with unknown loss of consciousness status, initial encounter   THERAPY DIAG:  Muscle weakness (generalized)  Other symptoms and signs involving the nervous system  Decreased functional activity tolerance  Visuospatial deficit  Rationale for Evaluation and Treatment: Rehabilitation  SUBJECTIVE:   SUBJECTIVE STATEMENT: Pt reports working on [employer] of bills, dinner with friends  Went to grocery store Hip issues.  Did breathing twice this morning due to hip burning, took the dog Scooter outside   Woke up in the middle of the night.  Havent had  Jitters 3 nights in a row  Pt accompanied by: friend - Kim, also OT  PERTINENT HISTORY: PMH includes: PAD, CAD (s/p CABG in 2011), HLD, HTN, and OSA, R THA   Pt admitted to [facility] on 10/24/2023 after a ground level fall when playing tennis. Scans revealed a traumatic subarachnoid hemorrhage and an occipital fx   PRECAUTIONS: Fall and Other: concussion  WEIGHT BEARING RESTRICTIONS: No  PAIN:  Are you having pain? Yes: NPRS scale: 2/10 Pain location: LLE Pain description: sore Aggravating factors: fatigue Relieving factors: rest, medications  FALLS: Has patient fallen in last 6 months? Yes. Number of falls 1 - fell when playing tennis, resulting in [facility]  LIVING ENVIRONMENT: Family/patient expects to be discharged to:: Private residence Living Arrangements: Spouse/significant other Available Help at Discharge: Family Type of Home: House Home Access: Other (comment) (2 steps in house, no railings to support) Home Layout: One level Bathroom Shower/Tub: Health visitor: Handicapped height Bathroom Accessibility: Yes Home Equipment: Crutches;Shower seat - built in;Grab bars - tub/shower Additional Comments: dog that  is blind 68 yo  Lives With: Spouse    PLOF: Independent; was driving; Electronics engineer with lots of computer, phone, and video chats  PATIENT GOALS: return to playing guitar and outdoor work  OBJECTIVE:  Note: Objective measures were completed at Evaluation unless otherwise noted.  HAND DOMINANCE: Right  ADLs: Eating: is cautious around sharp items such as a knife Grooming: unable to bend forward to spit into sink UB Dressing: I LB Dressing: requires help to don shoes and socks as reaching provokes symptoms Toileting: mod I Bathing: mod I Equipment: Grab bars, Walk in shower, Reacher, and HHSH  IADLs: Shopping: has not tried Light housekeeping: light housekeeping only Meal Prep: dependent Community mobility: dependent Medication management: mod I Financial management: mod I Handwriting:  no change  MOBILITY STATUS: Needs Assist: Rollator for long distances, cane in home  ACTIVITY TOLERANCE: Activity tolerance: poor  FUNCTIONAL OUTCOME MEASURES: PSFS: 1.3 total score   Total score = sum of the activity scores/number of activities Minimum detectable change (90%CI) for average score = 2 points Minimum  detectable change (90%CI) for single activity score = 3 points  UPPER EXTREMITY ROM and MMT:    BUE: WNL  HAND FUNCTION: Grip strength: Right: 72.3 lbs; Left: 69.6 lbs  COORDINATION: 9 Hole Peg test: Right: 25 sec; Left: 22 sec  SENSATION: No paresthesias in hands  EDEMA: none reported or observed  MUSCLE TONE: BUE WFL  COGNITION: Overall cognitive status: Within functional limits for tasks assessed  VISION: Subjective report: no change noticed other than fatigue Baseline vision: Bifocals, Wears glasses all the time, and progressive lenses Visual history:  none  VISION ASSESSMENT: WFL  PERCEPTION: Not tested  PRAXIS: Not tested  OBSERVATIONS: Pt appears well-kept though keeps eyes closed off and on throughout visit. Dons glasses for near  activities and doffs for the remainder of the time. Ambulates with use of rollator and has cane with him. Pt demonstrating guarding of neck while turning to sides but brings head to objects putting neck into flexion.                                                                                                                           TODAY'S TREATMENT :    Therapeutic Activities: OT placed 24 colored clothespins (6 red, 6, green, 6 blue and 6 black) around the gym for patient to locate.   Pt had to locate all 6 of 1 color before retrieving the next color for improved scanning, memory and concentration for locating items to promote visual neuro rehabilitation.  For additional challenge, therapist had matched the colored clothespin with similar colored objects around the gym ie) black on a black cord etc.  In addition, OT placed objects at all levels (high and low) and on both sides for scanning left and right as well as in areas requiring him to look slightly behind object ie) atop a ball rack in gym.  Pt had no incidents of LOB but did sit s/p locating 12 before finishing the task due to hip pain issues recently developed. Pt was on his feet for 5-10 minutes at a time.  Pt able to find 2/3 of the objects on his own with mod cues to find several objects he had previously located.   Self Care: Provided additional Memory Compensation Strategies handout with emphasis on certain strategies ie) -Use "WARM" strategy. W= write it down A=  associate it R=  repeat it M=  make a mental picture -Pt is already keeping a Glass blower/designer. -Keep a basket, or a message board near the door for reminders. -Use sticky notes - Place sticky notes near places you might need to make a note for something to remember.  PATIENT EDUCATION: Education details:  Memory Strategies Person educated: Patient and friend Education method: Explanation, Demonstration, and Verbal cues Education comprehension: verbalized  understanding, returned demonstration, verbal cues required, and needs further education  HOME EXERCISE PROGRAM: 11/13/2023: visual scanning activities  GOALS:  SHORT TERM GOALS: Target date: 12/05/2023    Patient will  demonstrate HEP with visual handouts only for proper execution.  Baseline: Goal status: IN Progress  2.  Pt will verbalize understanding of stress reduction techniques as needed to promote needed rest following concussion/SDH.  Baseline:  Goal status: MET  3.  Pt will return demonstration of LB dressing with modifications/use of AD as needed to improve occupational performance.  Baseline:  Goal status: MET   LONG TERM GOALS: Target date: 12/21/2023  Patient will report at least two-point increase in average PSFS score or at least three-point increase in a single activity score indicating functionally significant improvement given minimum detectable change.  Baseline: 1.3 total score (See above for individual activity scores)  Goal status: IN Progress   ASSESSMENT:  CLINICAL IMPRESSION: Patient is a 68 y.o. male who was seen today for occupational therapy treatment for subarachnoid hemorrhage s/p fall. Pt continues to benefit from skilled OT services in the outpatient setting to work on impairments noted at eval and help him to return to PLOF.   PERFORMANCE DEFICITS: in functional skills including ADLs, IADLs, pain, muscle spasms, body mechanics, endurance, decreased knowledge of use of DME, vision, and vestibular.   IMPAIRMENTS: are limiting patient from ADLs, IADLs, rest and sleep, work, leisure, and social participation.   CO-MORBIDITIES: may have co-morbidities  that affects occupational performance. Patient will benefit from skilled OT to address above impairments and improve overall function.  REHAB POTENTIAL: Good  PLAN:  OT FREQUENCY: 1-2x/week  OT DURATION: 6 weeks  PLANNED INTERVENTIONS: 97168 OT Re-evaluation, 97535 self care/ADL training, 30865  therapeutic exercise, 97530 therapeutic activity, 97112 neuromuscular re-education, functional mobility training, visual/perceptual remediation/compensation, energy conservation, coping strategies training, patient/family education, and DME and/or AE instructions  RECOMMENDED OTHER SERVICES: N/A for this visit - pt has ST and PT referrals  CONSULTED AND AGREED WITH PLAN OF CARE: Patient and friend  PLAN FOR NEXT SESSION: Review goals (decrease frequency v. DC); scanning activities; activity modification   Victorino Sparrow, OT 11/22/2023, 2:42 PM

## 2023-11-22 NOTE — Therapy (Signed)
 OUTPATIENT PHYSICAL THERAPY NEURO TREATMENT   Patient Name: Maurice Park MRN: [id number] DOB:31-Aug-1956, 68 y.o., male Today's Date: 11/22/2023   PCP: Lupita Raider, MD REFERRING PROVIDER: Juliet Rude, PA-C  END OF SESSION:  PT End of Session - 11/22/23 1103     Visit Number 5    Number of Visits 13    Date for PT Re-Evaluation 01/04/24    Authorization Type UHC    PT Start Time 1102    PT Stop Time 1145    PT Time Calculation (min) 43 min    Equipment Utilized During Treatment Gait belt    Activity Tolerance Patient tolerated treatment well    Behavior During Therapy WFL for tasks assessed/performed                Past Medical History:  Diagnosis Date   Angina    Anxiety    Back pain    CAD (coronary artery disease), with CABG Lima to LAD, Lt. radial artery to 1st diagnal, sequential SVG to PDA and PLA, 12/20/09 April 2011   Carotid artery disease (HCC)    status post right carotid endarterectomy   Chest pain    Dysrhythmia    History of heart attack    Hyperlipidemia, controlled    Hypertension    Joint pain    Lactose intolerance    Palpitations    Peripheral vascular disease (HCC)    Renal artery stenosis (HCC)    Right hip pain    Situational mixed anxiety and depressive disorder,     Sleep apnea    SOB (shortness of breath)    Past Surgical History:  Procedure Laterality Date   ABDOMINAL AORTOGRAM W/LOWER EXTREMITY N/A 03/28/2021   Procedure: ABDOMINAL AORTOGRAM W/LOWER EXTREMITY;  Surgeon: Runell Gess, MD;  Location: [facility];  Service: Cardiovascular;  Laterality: N/A;  limited runoff   ANTERIOR CRUCIATE LIGAMENT REPAIR Left    CARDIAC CATHETERIZATION  12/14/2009   multivessel CAD >> CABG (Dr. Nicki Guadalajara)   CARDIAC CATHETERIZATION  10/03/2011   L Cfx w/80% ostial stenosis, ramus with 50% segmental mid stenosis; RCA dominant & occluded in midportion; LIMA to LAD patent; SVG to PDA/PLA patent; free L radial to diagonal  functionally occluded (Dr. Erlene Quan)   CAROTID DOPPLER  11/2011   R CEA w/normal patency; left bulb w/0-49% diameter reduction   CAROTID ENDARTERECTOMY  05/03/2006   Dr. Tawanna Cooler Early   CORONARY ANGIOPLASTY  02/21/2000   PTCA with 3.25x81mm Quantum Monorail balloon of in-stent posterolateral branch restenosis, reduced from 60% to under 20% (Dr. Erlene Quan)   CORONARY ARTERY BYPASS GRAFT  12/20/2009   LIMA-LAD, left radial-1st diagonal, SVG-PDA & PLA (Dr. Kathie Rhodes. Hendrickson)   HIP ARTHROSCOPY W/ LABRAL REPAIR     LEFT HEART CATHETERIZATION WITH CORONARY/GRAFT ANGIOGRAM N/A 10/03/2011   Procedure: LEFT HEART CATHETERIZATION WITH Isabel Caprice;  Surgeon: Runell Gess, MD;  Location: [facility] C[facility];  Service: Cardiovascular;  Laterality: N/A;   LOWER EXTREMITY ARTERIAL DOPPLER  11/2005   normal study    NM MYOCAR IMG MI  04/2011   bruce myoview - normal perfusion; EF 58%; low risk scan   PERIPHERAL VASCULAR INTERVENTION  03/28/2021   Procedure: PERIPHERAL VASCULAR INTERVENTION;  Surgeon: Runell Gess, MD;  Location: [facility];  Service: Cardiovascular;;  Right common iliac stent   RENAL DOPPLER  12/2012   right renal stent w/1-59% diameter reduction   TRANSTHORACIC ECHOCARDIOGRAM  04/2011  EF=>55%; trace MR; mild TR   Patient Active Problem List   Diagnosis Date Noted   Traumatic subarachnoid hemorrhage (HCC) 10/24/2023   Peripheral arterial disease (HCC) 03/11/2021   Numbness and tingling of right leg 01/27/2021   Costochondritis 11/15/2020   Metabolic syndrome 11/11/2020   Prediabetes 10/18/2020   Class 1 obesity with serious comorbidity and body mass index (BMI) of 32.0 to 32.9 in adult 10/18/2020   Essential hypertension 07/02/2018   Renal artery stenosis (HCC) 07/24/2013   Carotid artery disease (HCC) 07/24/2013   Angina at rest [facility]) 10/02/2011   CAD (coronary artery disease), with CABG Lima to LAD, Lt. radial artery to 1st diagnal, sequential SVG to PDA and  PLA, 12/20/09 10/02/2011   Hyperlipidemia, controlled 10/02/2011   Situational mixed anxiety and depressive disorder,  10/02/2011    ONSET DATE: 10/26/2023 (referral date)  REFERRING DIAG: S06.6XAA (ICD-10-CM) - Traumatic subarachnoid hemorrhage with unknown loss of consciousness status, initial encounter (HCC)  THERAPY DIAG:  Muscle weakness (generalized)  Other abnormalities of gait and mobility  Unsteadiness on feet  Pain in left leg  Pain in right hip  Rationale for Evaluation and Treatment: Rehabilitation  SUBJECTIVE:                                                                                                                                                                                             SUBJECTIVE STATEMENT: "Maurice Park"  Pt continues to have ongoing R hip pain, unable to walk further than 25-30 yards before he has to stop and sit down. Pt has reached out to his doctor's office and is waiting on a call back. Pt has had no more spasms in his back and LE.  Pt's hamstring tightness getting better and he has less pain and more mobility, continues to stretch and foam roll.  Pt accompanied by: self PERTINENT HISTORY:  PMH includes: PAD, CAD (s/p CABG in 2011), HLD, HTN, and OSA, R THA  Pt admitted to [facility] on 10/24/2023 after a ground level fall when playing tennis. Scans revealed a traumatic subarachnoid hemorrhage and an occipital fx.   PAIN:  Are you having pain? 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  DIAGNOSTIC FINDINGS:  Cervical MRI 11/08/23, pending results  Cervical Spine Xray 10/25/23 IMPRESSION: 1. No acute findings. 2. Mild retrolisthesis at C5-6 without dynamic instability.  Head CT 10/24/13 FINDINGS: Brain: Scattered subarachnoid hemorrhage along the cerebral convexities, left sylvian fissure, and inter peduncular cistern is diffusely and mildly increased. Some of this change is related to redistribution with left subarachnoid blood seen along the inferior frontal lobes. Suspect mild inferior frontal lobe hemorrhage and edema from contusion, hemorrhage on the left measuring up to 5 mm. The pattern is compatible with history of head trauma. No evidence of infarct, hydrocephalus, or mass   Vascular: No hyperdense vessel or unexpected calcification.   Skull: Vertical, nondisplaced right occipital bone fracture, known.   Sinuses/Orbits: Generalized mucosal thickening in the ethmoid, sphenoid, and frontal sinuses.   IMPRESSION: 1. Scattered subarachnoid hemorrhage with mild increase/redistribution from yesterday. 2. Possible small inferior frontal lobe contusions. 3. Known occipital bone fracture that is nondepressed.  Head CT 10/30/2023 IMPRESSION: 1. Unchanged appearance of small anterior frontal lobe hemorrhagic contusions. 2. Unchanged appearance of nondisplaced right occipital fracture.   TREATMENT:  NMR To work on gaze stabilization, tracking, and static standing balance: Performance of [place] eye tracking chart for letters "a", "b", and "c" while standing on airex Pt able to circle letters in 1 min, 46 sec, and 42 sec with gradually narrower BOS as task progresses x 46, x 44, x 40 sec during 2nd round Pt has mild unsteadiness when stepping off of airex  To work on static standing balance in // bars with no UE support: Static stance  on rockerboard in A/P direction Horizontal head turns x 10 reps Vertical head turns x 10 reps (more difficult than horizontal) Ball toss x 10 reps, easy Static stance on rockerboard in L/R direction Horizontal head turns x 10 reps (more difficult than vertical) Vertical head turns x 10 reps Ball toss x 10 reps, more difficult    PATIENT EDUCATION: Education details: continue HEP Person educated: Patient Education method: Medical illustrator Education comprehension: verbalized understanding, returned demonstration, and needs further education  HOME EXERCISE PROGRAM: Access Code: [email or web address] URL: [email or web address] Date: 11/12/2023 Prepared by: Peter Congo  Exercises - Seated Hamstring Stretch with Strap  - 1 x daily - 7 x weekly - 1 sets - 3-5 reps - 30-60 hold - Hamstring Mobilization with Foam Roll  - 1-2 x daily - 7 x weekly - 1 sets - 10 reps - Gluteus Mobilization with Foam Roll  - 1-2 x daily - 7 x weekly - 1 sets - 10 reps - Clamshell  - 1 x daily - 7 x weekly - 3 sets - 5-10 reps - Sidelying Diagonal Hip Abduction  - 1 x daily - 7 x weekly - 3 sets - 5 reps   Keeping eyes on target on wall 5 feet away, tilt head down 15-30 and move head side to side for __30__ seconds. Repeat while moving head up and down for __30__ seconds. Do __3__ sessions per day. Oculomotor: Saccades    Holding two targets positioned side by side ___6_ inches apart, move eyes quickly from target to target as head stays still.  Perform sitting.  Do ___3_ sessions per day.  GOALS: Goals reviewed with patient? Yes  SHORT TERM GOALS: Target date: 11/30/2023  Pt will be independent with initial HEP for improved strength, balance, transfers and gait and management of his pain symptoms. Baseline:  Goal status: INITIAL  2.  Pt will improve gait velocity to at least 2.75 ft/sec for improved gait efficiency and performance at SBA level  Baseline: 2.45 ft/sec no AD  (2/28) Goal status: INITIAL  3.  Pt will improve 5 x STS to less than or equal to 17 seconds to demonstrate improved functional strength and transfer efficiency.  Baseline: 20 sec no UE (2/28) Goal status: INITIAL  4.  Pt will improve FGA to 16/30 for decreased fall risk  Baseline: 12/30 (2/28) Goal status: INITIAL  5.  Vestibular system to be assessed and LTG set as appropriate Baseline:  Goal status: INITIAL   LONG TERM GOALS: Target date: 12/21/2023   Pt will be independent with final HEP for improved strength, balance, transfers and gait and management of his pain symptoms. Baseline:  Goal status: INITIAL  2.  Pt will improve gait velocity to at least 3.0 ft/sec for improved gait efficiency and performance at mod I level  Baseline: 2.45 ft/sec no AD (2/28) Goal status: INITIAL  3.  Pt will improve 5 x STS to less than or equal to 14 seconds to demonstrate improved functional strength and transfer efficiency.  Baseline: 20 sec no UE (2/28) Goal status: INITIAL  4.  Pt will improve FGA to 20/30 for decreased fall risk  Baseline: 12/30 (2/28) Goal status: INITIAL  5.  Pt will report </= 1/5 for all movements on MSQ to indicate improvement in motion sensitivity and improved activity tolerance.   Baseline: 3/5 Goal status: REVISED   ASSESSMENT:  CLINICAL IMPRESSION: Emphasis of skilled PT session on working on gaze stabilization, visual tracking, and static standing balance on compliant surface and rockerboard. Pt with difficulty maintaining his balance on rockerboard when performing head turns in same direction that board is oriented in. Pt continues to benefit from skilled PT services to address ongoing R hip pain and weakness once further imaging of this joint has been performed as well as visual impairments. Continue POC.     OBJECTIVE IMPAIRMENTS: Abnormal gait, decreased activity tolerance, decreased knowledge of condition, decreased mobility, difficulty walking,  decreased ROM, decreased strength, dizziness, impaired perceived functional ability, increased muscle spasms, impaired vision/preception, and pain.   ACTIVITY LIMITATIONS: carrying, lifting, bending, standing, stairs, transfers, and bed mobility  PARTICIPATION LIMITATIONS: meal prep, cleaning, interpersonal relationship, driving, occupation, and yard work  PERSONAL FACTORS: 3+ comorbidities:   PAD, CAD (s/p CABG in 2011), HLD, HTN, and OSA, R THAare also affecting patient's functional outcome.   REHAB POTENTIAL: Good  CLINICAL DECISION MAKING: Evolving/moderate complexity  EVALUATION COMPLEXITY: High  PLAN:  PT FREQUENCY: 1-2x/week  PT DURATION: 6 weeks  PLANNED INTERVENTIONS: 97164- PT Re-evaluation, 97110-Therapeutic exercises, 97530- Therapeutic activity, 97112- Neuromuscular re-education, 97535- Self Care, 78295- Manual therapy, [phone]- Gait training, [phone]- Electrical stimulation (manual), Patient/Family education, Balance training, Stair training, Taping, Dry Needling, Joint mobilization, Spinal mobilization, Vestibular training, Visual/preceptual remediation/compensation, DME instructions, Cryotherapy, and Moist heat  PLAN FOR NEXT SESSION: did he hear back from PCP about R hip? results of cervical MRI and assess cervical ROM?, add to HEP to address L HS and glute tightness, R hip weakness, balance impairments based on FGA (head turns, pivots/turns, stepping over obstacles); convergence tasks (straw in cup, brock string), L eye occluded saccades, [place] eye tracking (all completed with standing balance challenge such as on airex)  Peter Congo, PT Peter Congo, PT, DPT, CSRS   11/22/2023, 11:46 AM

## 2023-11-26 ENCOUNTER — Ambulatory Visit: Admitting: Speech Pathology

## 2023-11-26 ENCOUNTER — Encounter: Payer: Self-pay | Admitting: Speech Pathology

## 2023-11-26 DIAGNOSIS — M6281 Muscle weakness (generalized): Secondary | ICD-10-CM | POA: Diagnosis not present

## 2023-11-26 DIAGNOSIS — R41841 Cognitive communication deficit: Secondary | ICD-10-CM

## 2023-11-26 NOTE — Therapy (Signed)
 OUTPATIENT SPEECH LANGUAGE PATHOLOGY TREATMENT   Patient Name: Maurice Park MRN: [id number] DOB:[birth year], 68 y.o., male Today's Date: 11/26/2023  PCP: Lupita Raider MD REFERRING PROVIDER: Juliet Rude, PA-C (Doc sent to PCP above)  END OF SESSION:  End of Session - 11/26/23 1021     Visit Number 4    Number of Visits 17    Date for SLP Re-Evaluation 01/08/24    Authorization Type UHC    SLP Start Time 1018    SLP Stop Time  1100    SLP Time Calculation (min) 42 min    Activity Tolerance Patient tolerated treatment well             Past Medical History:  Diagnosis Date   Angina    Anxiety    Back pain    CAD (coronary artery disease), with CABG Lima to LAD, Lt. radial artery to 1st diagnal, sequential SVG to PDA and PLA, 12/20/09 April 2011   Carotid artery disease (HCC)    status post right carotid endarterectomy   Chest pain    Dysrhythmia    History of heart attack    Hyperlipidemia, controlled    Hypertension    Joint pain    Lactose intolerance    Palpitations    Peripheral vascular disease (HCC)    Renal artery stenosis (HCC)    Right hip pain    Situational mixed anxiety and depressive disorder,     Sleep apnea    SOB (shortness of breath)    Past Surgical History:  Procedure Laterality Date   ABDOMINAL AORTOGRAM W/LOWER EXTREMITY N/A 03/28/2021   Procedure: ABDOMINAL AORTOGRAM W/LOWER EXTREMITY;  Surgeon: Runell Gess, MD;  Location: [facility];  Service: Cardiovascular;  Laterality: N/A;  limited runoff   ANTERIOR CRUCIATE LIGAMENT REPAIR Left    CARDIAC CATHETERIZATION  12/14/2009   multivessel CAD >> CABG (Dr. Nicki Guadalajara)   CARDIAC CATHETERIZATION  10/03/2011   L Cfx w/80% ostial stenosis, ramus with 50% segmental mid stenosis; RCA dominant & occluded in midportion; LIMA to LAD patent; SVG to PDA/PLA patent; free L radial to diagonal functionally occluded (Dr. Erlene Quan)   CAROTID DOPPLER  11/2011   R CEA w/normal patency;  left bulb w/0-49% diameter reduction   CAROTID ENDARTERECTOMY  05/03/2006   Dr. Tawanna Cooler Early   CORONARY ANGIOPLASTY  02/21/2000   PTCA with 3.25x25mm Quantum Monorail balloon of in-stent posterolateral branch restenosis, reduced from 60% to under 20% (Dr. Erlene Quan)   CORONARY ARTERY BYPASS GRAFT  12/20/2009   LIMA-LAD, left radial-1st diagonal, SVG-PDA & PLA (Dr. Kathie Rhodes. Hendrickson)   HIP ARTHROSCOPY W/ LABRAL REPAIR     LEFT HEART CATHETERIZATION WITH CORONARY/GRAFT ANGIOGRAM N/A 10/03/2011   Procedure: LEFT HEART CATHETERIZATION WITH Isabel Caprice;  Surgeon: Runell Gess, MD;  Location: [facility] CATH LAB;  Service: Cardiovascular;  Laterality: N/A;   LOWER EXTREMITY ARTERIAL DOPPLER  11/2005   normal study    NM MYOCAR IMG MI  04/2011   bruce myoview - normal perfusion; EF 58%; low risk scan   PERIPHERAL VASCULAR INTERVENTION  03/28/2021   Procedure: PERIPHERAL VASCULAR INTERVENTION;  Surgeon: Runell Gess, MD;  Location: [facility];  Service: Cardiovascular;;  Right common iliac stent   RENAL DOPPLER  12/2012   right renal stent w/1-59% diameter reduction   TRANSTHORACIC ECHOCARDIOGRAM  04/2011   EF=>55%; trace MR; mild TR   Patient Active Problem List   Diagnosis Date Noted  Traumatic subarachnoid hemorrhage (HCC) 10/24/2023   Peripheral arterial disease (HCC) 03/11/2021   Numbness and tingling of right leg 01/27/2021   Costochondritis 11/15/2020   Metabolic syndrome 11/11/2020   Prediabetes 10/18/2020   Class 1 obesity with serious comorbidity and body mass index (BMI) of 32.0 to 32.9 in adult 10/18/2020   Essential hypertension 07/02/2018   Renal artery stenosis (HCC) 07/24/2013   Carotid artery disease (HCC) 07/24/2013   Angina at rest [facility]) 10/02/2011   CAD (coronary artery disease), with CABG Lima to LAD, Lt. radial artery to 1st diagnal, sequential SVG to PDA and PLA, 12/20/09 10/02/2011   Hyperlipidemia, controlled 10/02/2011   Situational mixed anxiety  and depressive disorder,  10/02/2011    ONSET DATE: 10/26/2023 (Date of referral)    REFERRING DIAG: S06.6XAA (ICD-10-CM) - Traumatic subarachnoid hemorrhage with unknown loss of consciousness status, initial encounter  THERAPY DIAG: Cognitive communication deficit  Rationale for Evaluation and Treatment: Rehabilitation  SUBJECTIVE:   SUBJECTIVE STATEMENT: "I did too much Saturday and got a headache and was tired on Sunday" Pt accompanied by: significant other Maurice Park  PERTINENT HISTORY: "68 y/o male presents to [facility] on 2/12 after a ground level fall when playing tennis (+LOC with possible posturing for 2 minutes). Scans revealed a traumatic subarachnoid hemorrhage and an occipital fx . PMH includes: PAD, CAD (s/p CABG in 2011), HLD, HTN, and OSA, R THA"   PAIN: Are you having pain? Yes: NPRS scale: 4/10 Pain location: neck, head Pain description: ache Aggravating factors: movement Relieving factors: meds  FALLS: Has patient fallen in last 6 months?  Yes, Comment: fell while playing tennis resulting in [facility]  LIVING ENVIRONMENT: Lives with: lives with their family Lives in: House/apartment  PLOF:  Level of assistance: Independent with ADLs, Independent with IADLs Employment: Environmental education officer employment Economist)  PATIENT GOALS: "my reactions, being frustrated. Be able to handle every day tasks"  OBJECTIVE:  Note: Objective measures were completed at Evaluation unless otherwise noted.   STANDARDIZED ASSESSMENTS: CLQT initiated but terminated d/t brain fog and frustration  To be completed next session Electronics engineer, mazes, design generation) as time allows   PATIENT REPORTED OUTCOME MEASURES (PROM):  Cognitive Function PROM: 113/140 Maurice Park rated a 3 or somewhat difficulty planning for and keeping appointments, getting organized, recalling 4-5 errands, words being on the tip of his tongue, recalling numbers, getting started on simple tasks, planning out steps of a task                                                               TREATMENT DATE:   11/26/23: Maurice Park reports headache and fatigue when he does not follow energy conservation and not taking breaks. Reviewed energy conservation strategies of taking breaks, setting a timer and setting priorities . Maurice Park Id'd priorities for the week with mod I. We generated strategy of using notes to recall questions to ask his MD and to take notes after MD appointments. He is carrying over to do list, writing weekly priorities, and taking breaks (except Saturday) - He is completing household chores - sweeping, dishes, laundry etc with success. This week he will start simulating work activities. Discussed possible d/c next 1-2 sessions due to good progress  11/21/23: Spouse, Maurice Park, present for session. Maurice Park has carried over strategies  of taking breaks, setting time limits and using  relaxed breathing. He turned TV off to eliminate background noise and to support attention when bill paying.   He has generated a weekly to do list - Maurice Park required verbal cues to look up and ID how many times a day he is to do PT and OT exercises (3x a day) - We added PT/OT exercises to to -do list. He has started with mail and bill organization using a timer. Ongoing education re: energy conservation and sensory over load strategies.   11/19/23: Cognitive Function PROM completed - See above. Maurice Park continues to reduce initiation on mail and [employer], endorses clutter. We generated strategy of having a daily to do list and written priorities for each week and to set a timer to 15-20 minutes, work for the set time and take a break. Education provided re: energy conservation and management of high sensory environments.   11/13/23: Initiated education and instruction of cognitively engaging tasks for home, including re/learning new music and reading. Recommended patient read instructions/information aloud to aid attention to detail given error exhibited on clock  drawing. Pt and friend verbalized understanding.   PATIENT EDUCATION: Education details: see above Person educated: Patient and Friend Education method: Explanation Education comprehension: verbalized understanding, returned demonstration, and needs further education   GOALS: Goals reviewed with patient? Yes  SHORT TERM GOALS: Target date: 12/11/2023  Complete CLQT and/or cognitive PROM (add goals as needed) Baseline: Goal status: MET  2.  Pt will ID appropriate strategy/compensation for individual cognitive challenges x2 given rare min A Baseline:  Goal status: MET  3.  Pt will utilize cognitive strategies to optimize performance on simulated household tasks given rare min A  Baseline:  Goal status:MET  4.  Pt will utilize cognitive strategies to optimize performance on simulated work tasks given rare min A  Baseline:  Goal status: ONGOING   LONG TERM GOALS: Target date: 01/08/2024  Pt will report successful carryover of cognitive strategies at home/work with mod I  Baseline:  Goal status: ONGONG  2.  Pt will demonstrate awareness of potential cognitive challenges and teach back appropriate strategy to optimize performance  Baseline:  Goal status: ONGOING  3.  Pt will report improved cognitive functioning via PROM by LTG date  Baseline: complete 1st session  Goal status:ONGOING   ASSESSMENT:  CLINICAL IMPRESSION: Patient is a 68 y.o. M who was seen today for speech therapy evaluation following SAH d/t fall. Endorsed mild memory changes and delayed processing since fall. Has not challenged self to return to many prior tasks involving higher level cognitive skills at this time. CLQT initiated today, which revealed mildly reduced working memory for multi-step task and mildly reduced attention to detail. Demonstrated overt frustration with testing performance and concern for return to prior baseline. Provided recommendations to optimize current cognitive functioning at  home. Pt would benefit from skilled ST intervention to optimize return to cognitive baseline.   OBJECTIVE IMPAIRMENTS: include attention, memory, and executive functioning. These impairments are limiting patient from return to work and ADLs/IADLs. Factors affecting potential to achieve goals and functional outcome are  None . Patient will benefit from skilled SLP services to address above impairments and improve overall function.  REHAB POTENTIAL: Good  PLAN:  SLP FREQUENCY: 1-2x/week  SLP DURATION: 8 weeks  PLANNED INTERVENTIONS: Environmental controls, Cueing hierachy, Cognitive reorganization, Functional tasks, SLP instruction and feedback, Compensatory strategies, Patient/family education, and [id number] Treatment of speech (30 or 45 min)  Nyzir Dubois, Radene Journey, CCC-SLP 11/26/2023, 12:27 PM

## 2023-11-26 NOTE — Patient Instructions (Signed)
   Can I go on the boat - Boat jarring on the brain  Flying  What is safe to do physically Tennis or other athletics and exercise  Driving  When can I go back [place] activities  Yard work, boat work may be OK physically but remember it does take   Start doing some work reading or simulated work activities

## 2023-11-27 ENCOUNTER — Ambulatory Visit: Payer: 59 | Admitting: Physical Therapy

## 2023-11-27 ENCOUNTER — Ambulatory Visit: Payer: 59 | Admitting: Occupational Therapy

## 2023-11-27 DIAGNOSIS — M25551 Pain in right hip: Secondary | ICD-10-CM

## 2023-11-27 DIAGNOSIS — M79605 Pain in left leg: Secondary | ICD-10-CM

## 2023-11-27 DIAGNOSIS — M6281 Muscle weakness (generalized): Secondary | ICD-10-CM

## 2023-11-27 DIAGNOSIS — R41842 Visuospatial deficit: Secondary | ICD-10-CM

## 2023-11-27 DIAGNOSIS — R2689 Other abnormalities of gait and mobility: Secondary | ICD-10-CM

## 2023-11-27 DIAGNOSIS — R29818 Other symptoms and signs involving the nervous system: Secondary | ICD-10-CM

## 2023-11-27 DIAGNOSIS — S066XAA Traumatic subarachnoid hemorrhage with loss of consciousness status unknown, initial encounter: Secondary | ICD-10-CM

## 2023-11-27 DIAGNOSIS — R2681 Unsteadiness on feet: Secondary | ICD-10-CM

## 2023-11-27 NOTE — Therapy (Signed)
 OUTPATIENT OCCUPATIONAL THERAPY NEURO TREATMENT  Patient Name: Maurice Park MRN: [id number] DOB:07-Jul-1956, 68 y.o., male Today's Date: 11/27/2023  PCP: Lupita Raider, MD REFERRING PROVIDER: Juliet Rude, PA-C  END OF SESSION:  OT End of Session - 11/27/23 0935     Visit Number 6    Number of Visits 13    Date for OT Re-Evaluation 12/21/23    Authorization Type [employer]    OT Start Time [id number]    OT Stop Time 1015    OT Time Calculation (min) 39 min    Activity Tolerance Patient tolerated treatment well    Behavior During Therapy [facility] for tasks assessed/performed             Past Medical History:  Diagnosis Date   Angina    Anxiety    Back pain    CAD (coronary artery disease), with CABG [place] to LAD, Lt. radial artery to 1st diagnal, sequential SVG to PDA and PLA, 12/20/09 April 2011   Carotid artery disease (HCC)    status post right carotid endarterectomy   Chest pain    Dysrhythmia    History of heart attack    Hyperlipidemia, controlled    Hypertension    Joint pain    Lactose intolerance    Palpitations    Peripheral vascular disease (HCC)    Renal artery stenosis (HCC)    Right hip pain    Situational mixed anxiety and depressive disorder,     Sleep apnea    SOB (shortness of breath)    Past Surgical History:  Procedure Laterality Date   ABDOMINAL AORTOGRAM W/LOWER EXTREMITY N/A 03/28/2021   Procedure: ABDOMINAL AORTOGRAM W/LOWER EXTREMITY;  Surgeon: Runell Gess, MD;  Location: [facility] INV[facility];  Service: Cardiovascular;  Laterality: N/A;  limited runoff   ANTERIOR CRUCIATE LIGAMENT REPAIR Left    CARDIAC CATHETERIZATION  12/14/2009   multivessel CAD >> CABG (Dr. Nicki Guadalajara)   CARDIAC CATHETERIZATION  10/03/2011   L Cfx w/80% ostial stenosis, ramus with 50% segmental mid stenosis; RCA dominant & occluded in midportion; LIMA to LAD patent; SVG to PDA/PLA patent; free L radial to diagonal functionally occluded (Dr. Erlene Quan)   CAROTID DOPPLER   11/2011   R CEA w/normal patency; left bulb w/0-49% diameter reduction   CAROTID ENDARTERECTOMY  05/03/2006   Dr. Tawanna Cooler Early   CORONARY ANGIOPLASTY  02/21/2000   PTCA with 3.25x65mm Quantum Monorail balloon of in-stent posterolateral branch restenosis, reduced from 60% to under 20% (Dr. Erlene Quan)   CORONARY ARTERY BYPASS GRAFT  12/20/2009   LIMA-LAD, left radial-1st diagonal, SVG-PDA & PLA (Dr. Kathie Rhodes. Hendrickson)   HIP ARTHROSCOPY W/ LABRAL REPAIR     LEFT HEART CATHETERIZATION WITH CORONARY/GRAFT ANGIOGRAM N/A 10/03/2011   Procedure: LEFT HEART CATHETERIZATION WITH Isabel Caprice;  Surgeon: Runell Gess, MD;  Location: [facility] CATH LAB;  Service: Cardiovascular;  Laterality: N/A;   LOWER EXTREMITY ARTERIAL DOPPLER  11/2005   normal study    NM MYOCAR IMG MI  04/2011   bruce myoview - normal perfusion; EF 58%; low risk scan   PERIPHERAL VASCULAR INTERVENTION  03/28/2021   Procedure: PERIPHERAL VASCULAR INTERVENTION;  Surgeon: Runell Gess, MD;  Location: [facility];  Service: Cardiovascular;;  Right common iliac stent   RENAL DOPPLER  12/2012   right renal stent w/1-59% diameter reduction   TRANSTHORACIC ECHOCARDIOGRAM  04/2011   EF=>55%; trace MR; mild TR   Patient Active Problem List   Diagnosis  Date Noted   Traumatic subarachnoid hemorrhage (HCC) 10/24/2023   Peripheral arterial disease (HCC) 03/11/2021   Numbness and tingling of right leg 01/27/2021   Costochondritis 11/15/2020   Metabolic syndrome 11/11/2020   Prediabetes 10/18/2020   Class 1 obesity with serious comorbidity and body mass index (BMI) of 32.0 to 32.9 in adult 10/18/2020   Essential hypertension 07/02/2018   Renal artery stenosis (HCC) 07/24/2013   Carotid artery disease (HCC) 07/24/2013   Angina at rest [facility]) 10/02/2011   CAD (coronary artery disease), with CABG [place] to LAD, Lt. radial artery to 1st diagnal, sequential SVG to PDA and PLA, 12/20/09 10/02/2011   Hyperlipidemia, controlled  10/02/2011   Situational mixed anxiety and depressive disorder,  10/02/2011   ONSET DATE: 10/26/2023 (Date of referral)  REFERRING DIAG: S06.6XAA (ICD-10-CM) - Traumatic subarachnoid hemorrhage with unknown loss of consciousness status, initial encounter   THERAPY DIAG:  No diagnosis found.  Rationale for Evaluation and Treatment: Rehabilitation  SUBJECTIVE:   SUBJECTIVE STATEMENT: Pt reports working on [employer] of bills, dinner with friends  Went to grocery store Hip issues.  Did breathing twice this morning due to hip burning, took the dog Scooter outside   Woke up in the middle of the night.  Havent had  Jitters 3 nights in a row  Pt accompanied by: friend - Kim, also OT  PERTINENT HISTORY: PMH includes: PAD, CAD (s/p CABG in 2011), HLD, HTN, and OSA, R THA   Pt admitted to [facility] on 10/24/2023 after a ground level fall when playing tennis. Scans revealed a traumatic subarachnoid hemorrhage and an occipital fx   PRECAUTIONS: Fall and Other: concussion  WEIGHT BEARING RESTRICTIONS: No  PAIN:  Are you having pain? Yes: NPRS scale: 2/10 Pain location: LLE Pain description: sore Aggravating factors: fatigue Relieving factors: rest, medications  FALLS: Has patient fallen in last 6 months? Yes. Number of falls 1 - fell when playing tennis, resulting in [facility]  LIVING ENVIRONMENT: Family/patient expects to be discharged to:: Private residence Living Arrangements: Spouse/significant other Available Help at Discharge: Family Type of Home: House Home Access: Other (comment) (2 steps in house, no railings to support) Home Layout: One level Bathroom Shower/Tub: Health visitor: Handicapped height Bathroom Accessibility: Yes Home Equipment: Crutches;Shower seat - built in;Grab bars - tub/shower Additional Comments: dog that is blind 35 yo  Lives With: Spouse    PLOF: Independent; was driving; Electronics engineer with lots of computer, phone, and  video chats  PATIENT GOALS: return to playing guitar and outdoor work  OBJECTIVE:  Note: Objective measures were completed at Evaluation unless otherwise noted.  HAND DOMINANCE: Right  ADLs: Eating: is cautious around sharp items such as a knife Grooming: unable to bend forward to spit into sink UB Dressing: I LB Dressing: requires help to don shoes and socks as reaching provokes symptoms Toileting: mod I Bathing: mod I Equipment: Grab bars, Walk in shower, Reacher, and HHSH  IADLs: Shopping: has not tried Light housekeeping: light housekeeping only Meal Prep: dependent Community mobility: dependent Medication management: mod I Financial management: mod I Handwriting:  no change  MOBILITY STATUS: Needs Assist: Rollator for long distances, cane in home  ACTIVITY TOLERANCE: Activity tolerance: poor  FUNCTIONAL OUTCOME MEASURES: PSFS: 1.3 total score  11/27/2023: PSFS: 7.7 total score   Total score = sum of the activity scores/number of activities Minimum detectable change (90%CI) for average score = 2 points Minimum detectable change (90%CI) for single activity score = 3 points  UPPER EXTREMITY ROM and MMT:    BUE: WNL  HAND FUNCTION: Grip strength: Right: 72.3 lbs; Left: 69.6 lbs  COORDINATION: 9 Hole Peg test: Right: 25 sec; Left: 22 sec  SENSATION: No paresthesias in hands  EDEMA: none reported or observed  MUSCLE TONE: BUE WFL  COGNITION: Overall cognitive status: Within functional limits for tasks assessed  VISION: Subjective report: no change noticed other than fatigue Baseline vision: Bifocals, Wears glasses all the time, and progressive lenses Visual history:  none  VISION ASSESSMENT: WFL  PERCEPTION: Not tested  PRAXIS: Not tested  OBSERVATIONS: Pt appears well-kept though keeps eyes closed off and on throughout visit. Dons glasses for near activities and doffs for the remainder of the time. Ambulates with use of rollator and has cane  with him. Pt demonstrating guarding of neck while turning to sides but brings head to objects putting neck into flexion.                                                                                                                           TODAY'S TREATMENT :    OT reviewed management strategies for concussion symptoms. Discussed strategies/recommendations for return to work. These included things like returning to work at or below 50% work hours and taking break during lunch instead of working through it.   Objective measures assessed as noted in Goals section to determine progression towards goals. OT placed 6 BlazePods in front of patient and had patient tap pods with palm of hand as pods lit up using OT Distraction and Scanning mode forgross motor coordination, reaction time, scanning and locating of items, processing, and endurance/stamina.   Hits were as follows:  RUE and LUE: 97 hits for 2 min duration and 1.046 sec reaction time  PATIENT EDUCATION: Education details:  POC; work recommendations, Theme park manager Person educated: Patient and friend Education method: Programmer, multimedia, Facilities manager, and Verbal cues Education comprehension: verbalized understanding, returned demonstration, verbal cues required, and needs further education  HOME EXERCISE PROGRAM: 11/13/2023: visual scanning activities  GOALS:  SHORT TERM GOALS: Target date: 12/05/2023    Patient will demonstrate HEP with visual handouts only for proper execution.  Baseline: Goal status: IN Progress  2.  Pt will verbalize understanding of stress reduction techniques as needed to promote needed rest following concussion/SDH.  Baseline:  Goal status: MET  3.  Pt will return demonstration of LB dressing with modifications/use of AD as needed to improve occupational performance.  Baseline:  Goal status: MET   LONG TERM GOALS: Target date: 12/21/2023  Patient will report at least two-point increase in average PSFS  score or at least three-point increase in a single activity score indicating functionally significant improvement given minimum detectable change.  Baseline: 1.3 total score (See above for individual activity scores)  11/27/2023: PSFS: 7.7 total score Goal status: MET   ASSESSMENT:  CLINICAL IMPRESSION: Patient demonstrates good progression towards goals. Will likely be appropriate for d/c at next visit.  PERFORMANCE  DEFICITS: in functional skills including ADLs, IADLs, pain, muscle spasms, body mechanics, endurance, decreased knowledge of use of DME, vision, and vestibular.   IMPAIRMENTS: are limiting patient from ADLs, IADLs, rest and sleep, work, leisure, and social participation.   CO-MORBIDITIES: may have co-morbidities  that affects occupational performance. Patient will benefit from skilled OT to address above impairments and improve overall function.  REHAB POTENTIAL: Good  PLAN:  OT FREQUENCY: 1-2x/week  OT DURATION: 6 weeks  PLANNED INTERVENTIONS: 97168 OT Re-evaluation, 97535 self care/ADL training, 47425 therapeutic exercise, 97530 therapeutic activity, 97112 neuromuscular re-education, functional mobility training, visual/perceptual remediation/compensation, energy conservation, coping strategies training, patient/family education, and DME and/or AE instructions  RECOMMENDED OTHER SERVICES: N/A for this visit - pt has ST and PT referrals  CONSULTED AND AGREED WITH PLAN OF CARE: Patient and friend  PLAN FOR NEXT SESSION: DC; scanning activities; activity modification   Delana Meyer, OT 11/27/2023, 9:45 AM

## 2023-11-27 NOTE — Therapy (Signed)
 OUTPATIENT PHYSICAL THERAPY NEURO TREATMENT   Patient Name: Maurice Park MRN: [id number] DOB:[birth year], 68 y.o., male Today's Date: 11/27/2023   PCP: Lupita Raider, MD REFERRING PROVIDER: Juliet Rude, PA-C  END OF SESSION:  PT End of Session - 11/27/23 1018     Visit Number 6    Number of Visits 13    Date for PT Re-Evaluation 01/04/24    Authorization Type [employer]    PT Start Time 1018    PT Stop Time 1105    PT Time Calculation (min) 47 min    Equipment Utilized During Treatment Gait belt    Activity Tolerance Patient tolerated treatment well    Behavior During Therapy WFL for tasks assessed/performed                 Past Medical History:  Diagnosis Date   Angina    Anxiety    Back pain    CAD (coronary artery disease), with CABG [place] to LAD, Lt. radial artery to 1st diagnal, sequential SVG to PDA and PLA, 12/20/09 April 2011   Carotid artery disease (HCC)    status post right carotid endarterectomy   Chest pain    Dysrhythmia    History of heart attack    Hyperlipidemia, controlled    Hypertension    Joint pain    Lactose intolerance    Palpitations    Peripheral vascular disease (HCC)    Renal artery stenosis (HCC)    Right hip pain    Situational mixed anxiety and depressive disorder,     Sleep apnea    SOB (shortness of breath)    Past Surgical History:  Procedure Laterality Date   ABDOMINAL AORTOGRAM W/LOWER EXTREMITY N/A 03/28/2021   Procedure: ABDOMINAL AORTOGRAM W/LOWER EXTREMITY;  Surgeon: Runell Gess, MD;  Location: [facility];  Service: Cardiovascular;  Laterality: N/A;  limited runoff   ANTERIOR CRUCIATE LIGAMENT REPAIR Left    CARDIAC CATHETERIZATION  12/14/2009   multivessel CAD >> CABG (Dr. Nicki Guadalajara)   CARDIAC CATHETERIZATION  10/03/2011   L Cfx w/80% ostial stenosis, ramus with 50% segmental mid stenosis; RCA dominant & occluded in midportion; LIMA to LAD patent; SVG to PDA/PLA patent; free L radial to diagonal  functionally occluded (Dr. Erlene Quan)   CAROTID DOPPLER  11/2011   R CEA w/normal patency; left bulb w/0-49% diameter reduction   CAROTID ENDARTERECTOMY  05/03/2006   Dr. Tawanna Cooler Early   CORONARY ANGIOPLASTY  02/21/2000   PTCA with 3.25x21mm Quantum Monorail balloon of in-stent posterolateral branch restenosis, reduced from 60% to under 20% (Dr. Erlene Quan)   CORONARY ARTERY BYPASS GRAFT  12/20/2009   LIMA-LAD, left radial-1st diagonal, SVG-PDA & PLA (Dr. Kathie Rhodes. Hendrickson)   HIP ARTHROSCOPY W/ LABRAL REPAIR     LEFT HEART CATHETERIZATION WITH CORONARY/GRAFT ANGIOGRAM N/A 10/03/2011   Procedure: LEFT HEART CATHETERIZATION WITH Isabel Caprice;  Surgeon: Runell Gess, MD;  Location: [facility] CATH LAB;  Service: Cardiovascular;  Laterality: N/A;   LOWER EXTREMITY ARTERIAL DOPPLER  11/2005   normal study    NM MYOCAR IMG MI  04/2011   bruce myoview - normal perfusion; EF 58%; low risk scan   PERIPHERAL VASCULAR INTERVENTION  03/28/2021   Procedure: PERIPHERAL VASCULAR INTERVENTION;  Surgeon: Runell Gess, MD;  Location: [facility];  Service: Cardiovascular;;  Right common iliac stent   RENAL DOPPLER  12/2012   right renal stent w/1-59% diameter reduction   TRANSTHORACIC ECHOCARDIOGRAM  04/2011  EF=>55%; trace MR; mild TR   Patient Active Problem List   Diagnosis Date Noted   Traumatic subarachnoid hemorrhage (HCC) 10/24/2023   Peripheral arterial disease (HCC) 03/11/2021   Numbness and tingling of right leg 01/27/2021   Costochondritis 11/15/2020   Metabolic syndrome 11/11/2020   Prediabetes 10/18/2020   Class 1 obesity with serious comorbidity and body mass index (BMI) of 32.0 to 32.9 in adult 10/18/2020   Essential hypertension 07/02/2018   Renal artery stenosis (HCC) 07/24/2013   Carotid artery disease (HCC) 07/24/2013   Angina at rest [facility]) 10/02/2011   CAD (coronary artery disease), with CABG [place] to LAD, Lt. radial artery to 1st diagnal, sequential SVG to PDA and  PLA, 12/20/09 10/02/2011   Hyperlipidemia, controlled 10/02/2011   Situational mixed anxiety and depressive disorder,  10/02/2011    ONSET DATE: 10/26/2023 (referral date)  REFERRING DIAG: S06.6XAA (ICD-10-CM) - Traumatic subarachnoid hemorrhage with unknown loss of consciousness status, initial encounter (HCC)  THERAPY DIAG:  Muscle weakness (generalized)  Other abnormalities of gait and mobility  Unsteadiness on feet  Pain in left leg  Pain in right hip  Rationale for Evaluation and Treatment: Rehabilitation  SUBJECTIVE:                                                                                                                                                                                             SUBJECTIVE STATEMENT: "Maurice Park"  Pt finally was able to schedule with Dr. Madelon Lips next Tuesday at [facility] regarding his ongoing R hip pain. Pt reports that otherwise the tightness in his hamstrings has gotten better, he continues to stretch and use the foam roller. Pt reports he has not had spasms in 2+ weeks but it is painful to try and sleep on his R side due to the hip pain.  Pt reports that he did get a headache on Saturday after overdoing it, was working on his and his wife's taxes. Pt also reports that the loud therapy gym continues to be overstimulating and makes his ears pop.  Pt also sees Dr. Yetta Barre on Thursday at 1:30.  Pt accompanied by: self PERTINENT HISTORY:  PMH includes: PAD, CAD (s/p CABG in 2011), HLD, HTN, and OSA, R THA  Pt admitted to [facility] on 10/24/2023 after a ground level fall when playing tennis. Scans revealed a traumatic subarachnoid hemorrhage and an occipital fx.   PAIN:  Are you having pain? 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  DIAGNOSTIC FINDINGS:  Cervical MRI 11/08/23, pending results  Cervical Spine Xray 10/25/23 IMPRESSION: 1. No acute findings. 2. Mild retrolisthesis at C5-6 without dynamic instability.  Head CT 10/24/13 FINDINGS: Brain: Scattered subarachnoid hemorrhage along the cerebral convexities, left sylvian fissure, and inter peduncular cistern is diffusely and mildly increased. Some of this change is related to redistribution with left subarachnoid blood seen along the inferior frontal lobes. Suspect mild inferior frontal lobe hemorrhage and edema from contusion, hemorrhage on the left measuring up to 5 mm. The pattern is compatible with history of head trauma. No evidence of infarct, hydrocephalus, or mass   Vascular: No hyperdense vessel or unexpected calcification.   Skull: Vertical, nondisplaced right occipital bone fracture, known.   Sinuses/Orbits: Generalized mucosal thickening in the ethmoid, sphenoid, and frontal sinuses.   IMPRESSION: 1. Scattered subarachnoid hemorrhage with mild increase/redistribution from yesterday. 2. Possible small inferior frontal lobe contusions. 3. Known occipital bone fracture that is nondepressed.  Head CT 10/30/2023 IMPRESSION: 1. Unchanged appearance of small anterior frontal lobe hemorrhagic contusions. 2. Unchanged appearance of nondisplaced right occipital fracture.   TREATMENT:  NMR To work on dynamic standing balance with horizontal head turns: Gait x 115 ft while passing 8# weighted ball to the R and retrieving to the L Gait x 115 ft while passing 8# weighted ball to  the L and retrieving to the R Gait x 230 ft with horizontal head turns to read cards on L and R side  To work on static standing balance in // bars with no UE support: Static stance on Bosu x 60 sec With horizontal head turns 2 x 10 reps L/R With vertical head turns 2 x 10 reps up/down Most difficulty with looking up, posterior LOB and onset of dizziness   PATIENT EDUCATION: Education details: continue HEP, PT POC Person educated: Patient Education method: Explanation and Demonstration Education comprehension: verbalized understanding, returned demonstration, and needs further education  HOME EXERCISE PROGRAM: Access Code: [email or web address] URL: [email or web address] Date: 11/12/2023 Prepared by: Peter Congo  Exercises - Seated Hamstring Stretch with Strap  - 1 x daily - 7 x weekly - 1 sets - 3-5 reps - 30-60 hold - Hamstring Mobilization with Foam Roll  - 1-2 x daily - 7 x weekly - 1 sets - 10 reps - Gluteus Mobilization with Foam Roll  - 1-2 x daily - 7 x weekly - 1 sets - 10 reps - Clamshell  - 1 x daily - 7 x weekly - 3 sets - 5-10 reps - Sidelying Diagonal Hip Abduction  - 1 x daily - 7 x weekly - 3 sets - 5 reps   Keeping eyes on target on wall 5 feet away, tilt head down 15-30 and move head side to side for __30__ seconds. Repeat while moving head up and down for __30__ seconds. Do __3__ sessions per day. Oculomotor: Saccades    Holding two targets positioned side by side ___6_ inches apart, move eyes quickly from target to target as head stays still.  Perform sitting.  Do ___3_ sessions per day.  GOALS: Goals reviewed with patient? Yes  SHORT TERM GOALS: Target date: 11/30/2023  Pt  will be independent with initial HEP for improved strength, balance, transfers and gait and management of his pain symptoms. Baseline: Goal status: INITIAL  2.  Pt will improve gait velocity to at least 2.75 ft/sec for improved gait efficiency and performance at SBA level   Baseline: 2.45 ft/sec no AD (2/28) Goal status: INITIAL  3.  Pt will improve 5 x STS to less than or equal to 17 seconds to demonstrate improved functional strength and transfer efficiency.  Baseline: 20 sec no UE (2/28) Goal status: INITIAL  4.  Pt will improve FGA to 16/30 for decreased fall risk  Baseline: 12/30 (2/28) Goal status: INITIAL  5.  Vestibular system to be assessed and LTG set as appropriate Baseline:  Goal status: INITIAL   LONG TERM GOALS: Target date: 12/21/2023   Pt will be independent with final HEP for improved strength, balance, transfers and gait and management of his pain symptoms. Baseline:  Goal status: INITIAL  2.  Pt will improve gait velocity to at least 3.0 ft/sec for improved gait efficiency and performance at mod I level  Baseline: 2.45 ft/sec no AD (2/28) Goal status: INITIAL  3.  Pt will improve 5 x STS to less than or equal to 14 seconds to demonstrate improved functional strength and transfer efficiency.  Baseline: 20 sec no UE (2/28) Goal status: INITIAL  4.  Pt will improve FGA to 20/30 for decreased fall risk  Baseline: 12/30 (2/28) Goal status: INITIAL  5.  Pt will report </= 1/5 for all movements on MSQ to indicate improvement in motion sensitivity and improved activity tolerance.   Baseline: 3/5 Goal status: REVISED   ASSESSMENT:  CLINICAL IMPRESSION: Emphasis of skilled PT session on continuing to work on static and dynamic balance with head turns with attempts to avoid aggravating R hip pain. Pt continues to struggle the most with vertical head turns looking up but it is improved from previous sessions and in this sessions as it progresses. 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  Continue POC.     OBJECTIVE IMPAIRMENTS: Abnormal gait, decreased activity tolerance, decreased  knowledge of condition, decreased mobility, difficulty walking, decreased ROM, decreased strength, dizziness, impaired perceived functional ability, increased muscle spasms, impaired vision/preception, and pain.   ACTIVITY LIMITATIONS: carrying, lifting, bending, standing, stairs, transfers, and bed mobility  PARTICIPATION LIMITATIONS: meal prep, cleaning, interpersonal relationship, driving, occupation, and yard work  PERSONAL FACTORS: 3+ comorbidities:   PAD, CAD (s/p CABG in 2011), HLD, HTN, and OSA, R THAare also affecting patient's functional outcome.   REHAB POTENTIAL: Good  CLINICAL DECISION MAKING: Evolving/moderate complexity  EVALUATION COMPLEXITY: High  PLAN:  PT FREQUENCY: 1-2x/week  PT DURATION: 6 weeks  PLANNED INTERVENTIONS: 97164- PT Re-evaluation, 97110-Therapeutic exercises, 97530- Therapeutic activity, 97112- Neuromuscular re-education, 97535- Self Care, 84132- Manual therapy, [phone]- Gait training, [phone]- Electrical stimulation (manual), Patient/Family education, Balance training, Stair training, Taping, Dry Needling, Joint mobilization, Spinal mobilization, Vestibular training, Visual/preceptual remediation/compensation, DME instructions, Cryotherapy, and Moist heat  PLAN FOR NEXT SESSION: STG assess, results of cervical MRI and assess cervical ROM?, add to HEP to address L HS and glute tightness, R hip weakness, balance impairments based on FGA (head turns, pivots/turns, stepping over obstacles); convergence tasks (straw in cup, brock string), L eye occluded saccades, [place] eye tracking (all completed with standing balance challenge such as on airex)  Ladona Ridgel  Serita Grit, PT Peter Congo, PT, DPT, CSRS   11/27/2023, 11:06 AM

## 2023-11-28 ENCOUNTER — Ambulatory Visit

## 2023-11-28 DIAGNOSIS — M6281 Muscle weakness (generalized): Secondary | ICD-10-CM | POA: Diagnosis not present

## 2023-11-28 DIAGNOSIS — R41841 Cognitive communication deficit: Secondary | ICD-10-CM

## 2023-11-28 NOTE — Therapy (Signed)
 OUTPATIENT SPEECH LANGUAGE PATHOLOGY TREATMENT (DISCHARGE)   Patient Name: Maurice Park MRN: [id number] DOB:07-08-1956, 68 y.o., male Today's Date: 11/28/2023  PCP: Lupita Raider MD REFERRING PROVIDER: Juliet Rude, PA-C (Doc sent to PCP above)  END OF SESSION:  End of Session - 11/28/23 0845     Visit Number 5    Number of Visits 17    Date for SLP Re-Evaluation 01/08/24    Authorization Type [employer]    SLP Start Time [phone]    SLP Stop Time  0930    SLP Time Calculation (min) 44 min    Activity Tolerance Patient tolerated treatment well            SPEECH THERAPY DISCHARGE SUMMARY  Visits from Start of Care: 5  Current functional level related to goals / functional outcomes: Exhibits and reports improved cognitive functioning with use of trained strategies and techniques. Confident with ST discharge today.    Remaining deficits: Headaches, cognitive fatigue   Education / Equipment: Cognitive compensations, energy conservation strategies, caregiver education   Patient agrees to discharge. Patient goals were met. Patient is being discharged due to meeting the stated rehab goals.     Past Medical History:  Diagnosis Date   Angina    Anxiety    Back pain    CAD (coronary artery disease), with CABG [place] to LAD, Lt. radial artery to 1st diagnal, sequential SVG to PDA and PLA, 12/20/09 April 2011   Carotid artery disease [facility])    status post right carotid endarterectomy   Chest pain    Dysrhythmia    History of heart attack    Hyperlipidemia, controlled    Hypertension    Joint pain    Lactose intolerance    Palpitations    Peripheral vascular disease (HCC)    Renal artery stenosis (HCC)    Right hip pain    Situational mixed anxiety and depressive disorder,     Sleep apnea    SOB (shortness of breath)    Past Surgical History:  Procedure Laterality Date   ABDOMINAL AORTOGRAM W/LOWER EXTREMITY N/A 03/28/2021   Procedure: ABDOMINAL AORTOGRAM W/LOWER  EXTREMITY;  Surgeon: Runell Gess, MD;  Location: [facility] INVASIVE CV LAB;  Service: Cardiovascular;  Laterality: N/A;  limited runoff   ANTERIOR CRUCIATE LIGAMENT REPAIR Left    CARDIAC CATHETERIZATION  12/14/2009   multivessel CAD >> CABG (Dr. Nicki Guadalajara)   CARDIAC CATHETERIZATION  10/03/2011   L Cfx w/80% ostial stenosis, ramus with 50% segmental mid stenosis; RCA dominant & occluded in midportion; LIMA to LAD patent; SVG to PDA/PLA patent; free L radial to diagonal functionally occluded (Dr. Erlene Quan)   CAROTID DOPPLER  11/2011   R CEA w/normal patency; left bulb w/0-49% diameter reduction   CAROTID ENDARTERECTOMY  05/03/2006   Dr. Tawanna Cooler Early   CORONARY ANGIOPLASTY  02/21/2000   PTCA with 3.25x73mm [employer] Monorail balloon of in-stent posterolateral branch restenosis, reduced from 60% to under 20% (Dr. Erlene Quan)   CORONARY ARTERY BYPASS GRAFT  12/20/2009   LIMA-LAD, left radial-1st diagonal, SVG-PDA & PLA (Dr. Kathie Rhodes. Hendrickson)   HIP ARTHROSCOPY W/ LABRAL REPAIR     LEFT HEART CATHETERIZATION WITH CORONARY/GRAFT ANGIOGRAM N/A 10/03/2011   Procedure: LEFT HEART CATHETERIZATION WITH Isabel Caprice;  Surgeon: Runell Gess, MD;  Location: [facility] CATH LAB;  Service: Cardiovascular;  Laterality: N/A;   LOWER EXTREMITY ARTERIAL DOPPLER  11/2005   normal study    NM MYOCAR IMG MI  04/2011  bruce myoview - normal perfusion; EF 58%; low risk scan   PERIPHERAL VASCULAR INTERVENTION  03/28/2021   Procedure: PERIPHERAL VASCULAR INTERVENTION;  Surgeon: Runell Gess, MD;  Location: [facility] INVAS[facility] CV [facility];  Service: Cardiovascular;;  Right common iliac stent   RENAL DOPPLER  12/2012   right renal stent w/1-59% diameter reduction   TRANSTHORACIC ECHOCARDIOGRAM  04/2011   EF=>55%; trace MR; mild TR   Patient Active Problem List   Diagnosis Date Noted   Traumatic subarachnoid hemorrhage (HCC) 10/24/2023   Peripheral arterial disease (HCC) 03/11/2021   Numbness and tingling of right leg  01/27/2021   Costochondritis 11/15/2020   Metabolic syndrome 11/11/2020   Prediabetes 10/18/2020   Class 1 obesity with serious comorbidity and body mass index (BMI) of 32.0 to 32.9 in adult 10/18/2020   Essential hypertension 07/02/2018   Renal artery stenosis (HCC) 07/24/2013   Carotid artery disease (HCC) 07/24/2013   Angina at rest [facility]) 10/02/2011   CAD (coronary artery disease), with CABG [place] to LAD, Lt. radial artery to 1st diagnal, sequential SVG to PDA and PLA, 12/20/09 10/02/2011   Hyperlipidemia, controlled 10/02/2011   Situational mixed anxiety and depressive disorder,  10/02/2011    ONSET DATE: 10/26/2023 (Date of referral)    REFERRING DIAG: S06.6XAA (ICD-10-CM) - Traumatic subarachnoid hemorrhage with unknown loss of consciousness status, initial encounter  THERAPY DIAG: Cognitive communication deficit  Rationale for Evaluation and Treatment: Rehabilitation  SUBJECTIVE:   SUBJECTIVE STATEMENT: "Much better" Pt accompanied by: self  PERTINENT HISTORY: "68 y/o male presents [facility] on 2/12 after a ground level fall when playing tennis (+LOC with possible posturing for 2 minutes). Scans revealed a traumatic subarachnoid hemorrhage and an occipital fx . PMH includes: PAD, CAD (s/p CABG in 2011), HLD, HTN, and OSA, R THA"   PAIN: Are you having pain? Yes: NPRS scale: 4/10 Pain location: neck, head Pain description: ache Aggravating factors: movement Relieving factors: meds  FALLS: Has patient fallen in last 6 months?  Yes, Comment: fell while playing tennis resulting in [facility]  LIVING ENVIRONMENT: Lives with: lives with their family Lives in: House/apartment  PLOF:  Level of assistance: Independent with ADLs, Independent with IADLs Employment: Environmental education officer employment Economist)  PATIENT GOALS: "my reactions, being frustrated. Be able to handle every day tasks"  OBJECTIVE:  Note: Objective measures were completed at Evaluation unless otherwise  noted.   STANDARDIZED ASSESSMENTS: CLQT initiated but terminated d/t brain fog and frustration  To be completed next session Electronics engineer, mazes, design generation) as time allows   PATIENT REPORTED OUTCOME MEASURES (PROM): Cognitive Function PROM: 113/140 [name] rated a 3 or somewhat difficulty planning for and keeping appointments, getting organized, recalling 4-5 errands, words being on the tip of his tongue, recalling numbers, getting started on simple tasks, planning out steps of a task                                                              TREATMENT DATE:  11/28/23: Pt reports implementing strategies for energy conversation and sensory overload to reduce BI triggers for headache and fatigue. SLP continued pt education on importance of taking a "brain break" when beginning to experience headache in order to avoid cognitive overload. Pt reports success with strategies educated in ST, and working  to decrease daily stressors/workload. Pt inquired about ST discharge d/t improvement made and increased confidence in cognitive abilities. SLP initiated Cognitive Function PROM with pt score increasing 10 points from baseline. Pt agreeable to discharge.   11/26/23: [name] reports headache and fatigue when he does not follow energy conservation and not taking breaks. Reviewed energy conservation strategies of taking breaks, setting a timer and setting priorities . [name] Id'd priorities for the week with mod I. We generated strategy of using notes to recall questions to ask his MD and to take notes after MD appointments. He is carrying over to do list, writing weekly priorities, and taking breaks (except Saturday) - He is completing household chores - sweeping, dishes, laundry etc with success. This week he will start simulating work activities. Discussed possible d/c next 1-2 sessions due to good progress  11/21/23: Spouse, [name], present for session. [name] has carried over strategies of taking breaks, setting  time limits and using  relaxed breathing. He turned TV off to eliminate background noise and to support attention when bill paying.   He has generated a weekly to do list - [name] required verbal cues to look up and ID how many times a day he is to do PT and OT exercises (3x a day) - We added PT/OT exercises to to -do list. He has started with mail and bill organization using a timer. Ongoing education re: energy conservation and sensory over load strategies.   11/19/23: Cognitive Function PROM completed - See above. [name] continues to reduce initiation on mail and [employer], endorses clutter. We generated strategy of having a daily to do list and written priorities for each week and to set a timer to 15-20 minutes, work for the set time and take a break. Education provided re: energy conservation and management of high sensory environments.   11/13/23: Initiated education and instruction of cognitively engaging tasks for home, including re/learning new music and reading. Recommended patient read instructions/information aloud to aid attention to detail given error exhibited on clock drawing. Pt and friend verbalized understanding.   PATIENT EDUCATION: Education details: see above Person educated: Patient and Friend Education method: Explanation Education comprehension: verbalized understanding, returned demonstration, and needs further education   GOALS: Goals reviewed with patient? Yes  SHORT TERM GOALS: Target date: 12/11/2023  Complete CLQT and/or cognitive PROM (add goals as needed) Baseline: Goal status: MET  2.  Pt will ID appropriate strategy/compensation for individual cognitive challenges x2 given rare min A Baseline:  Goal status: MET  3.  Pt will utilize cognitive strategies to optimize performance on simulated household tasks given rare min A  Baseline:  Goal status:MET  4.  Pt will utilize cognitive strategies to optimize performance on simulated work tasks given rare min A   Baseline:  Goal status: MET   LONG TERM GOALS: Target date: 01/08/2024  Pt will report successful carryover of cognitive strategies at home/work with mod I  Baseline:  Goal status: MET  2.  Pt will demonstrate awareness of potential cognitive challenges and teach back appropriate strategy to optimize performance  Baseline:  Goal status: MET  3.  Pt will report improved cognitive functioning via PROM by LTG date  Baseline: 113/140 (eval); 123/140(d/c) Goal status:  MET   ASSESSMENT:  CLINICAL IMPRESSION: Patient is a 68 y.o. M who was seen today for speech therapy following SAH d/t fall. Pt implementing SLP recommended strategies and compensations to aid cognitive functioning. Pt is pleased with current level of functioning and  requested discharge this date. ST discharge completed.   OBJECTIVE IMPAIRMENTS: include attention, memory, and executive functioning. These impairments are limiting patient from return to work and ADLs/IADLs. Factors affecting potential to achieve goals and functional outcome are  None . Patient will benefit from skilled SLP services to address above impairments and improve overall function.  REHAB POTENTIAL: Good  PLAN:  SLP FREQUENCY: 1-2x/week  SLP DURATION: 8 weeks  PLANNED INTERVENTIONS: Environmental controls, Cueing hierachy, Cognitive reorganization, Functional tasks, SLP instruction and feedback, Compensatory strategies, Patient/family education, and [id number] Treatment of speech (30 or 45 min)     Gracy Racer, CCC-SLP 11/28/2023, 11:44 AM

## 2023-11-29 ENCOUNTER — Ambulatory Visit: Payer: 59 | Admitting: Occupational Therapy

## 2023-11-29 ENCOUNTER — Ambulatory Visit: Payer: 59 | Admitting: Physical Therapy

## 2023-11-29 DIAGNOSIS — R29818 Other symptoms and signs involving the nervous system: Secondary | ICD-10-CM

## 2023-11-29 DIAGNOSIS — M25551 Pain in right hip: Secondary | ICD-10-CM

## 2023-11-29 DIAGNOSIS — S066XAA Traumatic subarachnoid hemorrhage with loss of consciousness status unknown, initial encounter: Secondary | ICD-10-CM

## 2023-11-29 DIAGNOSIS — R2681 Unsteadiness on feet: Secondary | ICD-10-CM

## 2023-11-29 DIAGNOSIS — M79605 Pain in left leg: Secondary | ICD-10-CM

## 2023-11-29 DIAGNOSIS — M6281 Muscle weakness (generalized): Secondary | ICD-10-CM | POA: Diagnosis not present

## 2023-11-29 DIAGNOSIS — R2689 Other abnormalities of gait and mobility: Secondary | ICD-10-CM

## 2023-11-29 DIAGNOSIS — R41842 Visuospatial deficit: Secondary | ICD-10-CM

## 2023-11-29 NOTE — Therapy (Signed)
 OUTPATIENT PHYSICAL THERAPY NEURO TREATMENT   Patient Name: Maurice Park MRN: [id number] DOB:11-Oct-1955, 68 y.o., male Today's Date: 11/29/2023   PCP: Maurice Raider, MD REFERRING PROVIDER: Juliet Rude, PA-C  END OF SESSION:  PT End of Session - 11/29/23 1017     Visit Number 7    Number of Visits 13    Date for PT Re-Evaluation 01/04/24    Authorization Type [employer]    PT Start Time 1015    PT Stop Time 1056    PT Time Calculation (min) 41 min    Equipment Utilized During Treatment Gait belt    Activity Tolerance Patient tolerated treatment well    Behavior During Therapy WFL for tasks assessed/performed                  Past Medical History:  Diagnosis Date   Angina    Anxiety    Back pain    CAD (coronary artery disease), with CABG [place] to LAD, Lt. radial artery to 1st diagnal, sequential SVG to PDA and PLA, 12/20/09 April 2011   Carotid artery disease (HCC)    status post right carotid endarterectomy   Chest pain    Dysrhythmia    History of heart attack    Hyperlipidemia, controlled    Hypertension    Joint pain    Lactose intolerance    Palpitations    Peripheral vascular disease (HCC)    Renal artery stenosis (HCC)    Right hip pain    Situational mixed anxiety and depressive disorder,     Sleep apnea    SOB (shortness of breath)    Past Surgical History:  Procedure Laterality Date   ABDOMINAL AORTOGRAM W/LOWER EXTREMITY N/A 03/28/2021   Procedure: ABDOMINAL AORTOGRAM W/LOWER EXTREMITY;  Surgeon: Maurice Gess, MD;  Location: [facility];  Service: Cardiovascular;  Laterality: N/A;  limited runoff   ANTERIOR CRUCIATE LIGAMENT REPAIR Left    CARDIAC CATHETERIZATION  12/14/2009   multivessel CAD >> CABG (Dr. Nicki Park)   CARDIAC CATHETERIZATION  10/03/2011   L Cfx w/80% ostial stenosis, ramus with 50% segmental mid stenosis; RCA dominant & occluded in midportion; LIMA to LAD patent; SVG to PDA/PLA patent; free L radial to diagonal  functionally occluded (Dr. Erlene Park)   CAROTID DOPPLER  11/2011   R CEA w/normal patency; left bulb w/0-49% diameter reduction   CAROTID ENDARTERECTOMY  05/03/2006   Dr. Tawanna Cooler Park   CORONARY ANGIOPLASTY  02/21/2000   PTCA with 3.25x76mm Quantum Monorail balloon of in-stent posterolateral branch restenosis, reduced from 60% to under 20% (Dr. Erlene Park)   CORONARY ARTERY BYPASS GRAFT  12/20/2009   LIMA-LAD, left radial-1st diagonal, SVG-PDA & PLA (Dr. Kathie Rhodes. Park)   HIP ARTHROSCOPY W/ LABRAL REPAIR     LEFT HEART CATHETERIZATION WITH CORONARY/GRAFT ANGIOGRAM N/A 10/03/2011   Procedure: LEFT HEART CATHETERIZATION WITH Maurice Park;  Surgeon: Maurice Gess, MD;  Location[facility] CATH LAB;  Service: Cardiovascular;  Laterality: N/A;   LOWER EXTREMITY ARTERIAL DOPPLER  11/2005   normal study    NM MYOCAR IMG MI  04/2011   bruce myoview - normal perfusion; EF 58%; low risk scan   PERIPHERAL VASCULAR INTERVENTION  03/28/2021   Procedure: PERIPHERAL VASCULAR INTERVENTION;  Surgeon: Maurice Gess, MD;  Location: [facility];  Service: Cardiovascular;;  Right common iliac stent   RENAL DOPPLER  12/2012   right renal stent w/1-59% diameter reduction   TRANSTHORACIC ECHOCARDIOGRAM  04/2011  EF=>55%; trace MR; mild TR   Patient Active Problem List   Diagnosis Date Noted   Traumatic subarachnoid hemorrhage (HCC) 10/24/2023   Peripheral arterial disease (HCC) 03/11/2021   Numbness and tingling of right leg 01/27/2021   Costochondritis 11/15/2020   Metabolic syndrome 11/11/2020   Prediabetes 10/18/2020   Class 1 obesity with serious comorbidity and body mass index (BMI) of 32.0 to 32.9 in adult 10/18/2020   Essential hypertension 07/02/2018   Renal artery stenosis (HCC) 07/24/2013   Carotid artery disease (HCC) 07/24/2013   Angina at rest Maurice Park) 10/02/2011   CAD (coronary artery disease), with CABG [place] to LAD, Lt. radial artery to 1st diagnal, sequential SVG to PDA and  PLA, 12/20/09 10/02/2011   Hyperlipidemia, controlled 10/02/2011   Situational mixed anxiety and depressive disorder,  10/02/2011    ONSET DATE: 10/26/2023 (referral date)  REFERRING DIAG: S06.6XAA (ICD-10-CM) - Traumatic subarachnoid hemorrhage with unknown loss of consciousness status, initial encounter (HCC)  THERAPY DIAG:  Muscle weakness (generalized)  Other abnormalities of gait and mobility  Unsteadiness on feet  Pain in left leg  Pain in right hip  Rationale for Evaluation and Treatment: Rehabilitation  SUBJECTIVE:                                                                                                                                                                                             SUBJECTIVE STATEMENT: "Maurice Park"  Pt was very fatigued yesterday, had a very busy day and did a lot physically and mentally. Pt still feeling the effects today, a little sore as he has not done his stretching.  Pt also sees Maurice Park (neurologist) today at 1:30, hopefully will get cervical spine MRI results.  No pain at rest, has hip pain with walking. Head gets fuzzy in PT gym environment with all the noise.  HEP is going well.  Pt accompanied by: self and friend Maurice Park PERTINENT HISTORY:  PMH includes: PAD, CAD (s/p CABG in 2011), HLD, HTN, and OSA, R THA  Pt admitted to [facility] on 10/24/2023 after a ground level fall when playing tennis. Scans revealed a traumatic subarachnoid hemorrhage and an occipital fx.   PAIN:  Are you having pain? 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  DIAGNOSTIC FINDINGS:  Cervical MRI 11/08/23,  pending  results  Cervical Spine Xray 10/25/23 IMPRESSION: 1. No acute findings. 2. Mild retrolisthesis at C5-6 without dynamic instability.  Head CT 10/24/13 FINDINGS: Brain: Scattered subarachnoid hemorrhage along the cerebral convexities, left sylvian fissure, and inter peduncular cistern is diffusely and mildly increased. Some of this change is related to redistribution with left subarachnoid blood seen along the inferior frontal lobes. Suspect mild inferior frontal lobe hemorrhage and edema from contusion, hemorrhage on the left measuring up to 5 mm. The pattern is compatible with history of head trauma. No evidence of infarct, hydrocephalus, or mass   Vascular: No hyperdense vessel or unexpected calcification.   Skull: Vertical, nondisplaced right occipital bone fracture, known.   Sinuses/Orbits: Generalized mucosal thickening in the ethmoid, sphenoid, and frontal sinuses.   IMPRESSION: 1. Scattered subarachnoid hemorrhage with mild increase/redistribution from yesterday. 2. Possible small inferior frontal lobe contusions. 3. Known occipital bone fracture that is nondepressed.  Head CT 10/30/2023 IMPRESSION: 1. Unchanged appearance of small anterior frontal lobe hemorrhagic contusions. 2. Unchanged appearance of nondisplaced right occipital fracture.   TREATMENT:  TherAct For STG assessment:  [facility] PT Assessment - 11/29/23 1029       Ambulation/Gait   Gait velocity 32.8 ft over 8.78 sec = 3.74 ft/sec      Standardized Balance Assessment   Standardized Balance Assessment Timed Up and Go Test;Five Times Sit to Stand    Five times sit to stand comments  14 sec   no UE     Functional Gait  Assessment   Gait assessed  Yes    Gait Level Surface Walks 20 ft in less than 7 sec but greater than 5.5 sec, uses assistive device, slower speed, mild gait deviations, or deviates 6-10 in outside of the 12 in walkway width.    Change in Gait Speed Able to change speed,  demonstrates mild gait deviations, deviates 6-10 in outside of the 12 in walkway width, or no gait deviations, unable to achieve a major change in velocity, or uses a change in velocity, or uses an assistive device.    Gait with Horizontal Head Turns Performs head turns smoothly with slight change in gait velocity (eg, minor disruption to smooth gait path), deviates 6-10 in outside 12 in walkway width, or uses an assistive device.    Gait with Vertical Head Turns Performs task with slight change in gait velocity (eg, minor disruption to smooth gait path), deviates 6 - 10 in outside 12 in walkway width or uses assistive device    Gait and Pivot Turn Pivot turns safely in greater than 3 sec and stops with no loss of balance, or pivot turns safely within 3 sec and stops with mild imbalance, requires small steps to catch balance.    Step Over Obstacle Is able to step over one shoe box (4.5 in total height) without changing gait speed. No evidence of imbalance.    Gait with Narrow Base of Support Is able to ambulate for 10 steps heel to toe with no staggering.    Gait with Eyes Closed Walks 20 ft, uses assistive device, slower speed, mild gait deviations, deviates 6-10 in outside 12 in walkway width. Ambulates 20 ft in less than 9 sec but greater than 7 sec.    Ambulating Backwards Walks 20 ft, no assistive devices, good speed, no evidence for imbalance, normal gait    Steps Alternating feet, no rail.    Total Score 23    FGA comment: 23/30   medium fall risk  Pt again with onset of R lateral hip pain with short distance gait. Tender to palpation along origin of IT band and near insertion of glutes. Pt with decreased pain with pillow between knees in sidelying, increase in pain with activation of hip abductors.   PATIENT EDUCATION: Education details: continue HEP, PT POC, results of OM and functional implications Person educated: Patient Education method: Explanation and  Demonstration Education comprehension: verbalized understanding, returned demonstration, and needs further education  HOME EXERCISE PROGRAM: Access Code: [email or web address] URL: [email or web address] Date: 11/12/2023 Prepared by: Peter Congo  Exercises - Seated Hamstring Stretch with Strap  - 1 x daily - 7 x weekly - 1 sets - 3-5 reps - 30-60 hold - Hamstring Mobilization with Foam Roll  - 1-2 x daily - 7 x weekly - 1 sets - 10 reps - Gluteus Mobilization with Foam Roll  - 1-2 x daily - 7 x weekly - 1 sets - 10 reps - Clamshell  - 1 x daily - 7 x weekly - 3 sets - 5-10 reps - Sidelying Diagonal Hip Abduction  - 1 x daily - 7 x weekly - 3 sets - 5 reps   Keeping eyes on target on wall 5 feet away, tilt head down 15-30 and move head side to side for __30__ seconds. Repeat while moving head up and down for __30__ seconds. Do __3__ sessions per day. Oculomotor: Saccades    Holding two targets positioned side by side ___6_ inches apart, move eyes quickly from target to target as head stays still.  Perform sitting.  Do ___3_ sessions per day.  GOALS: Goals reviewed with patient? Yes  SHORT TERM GOALS: Target date: 11/30/2023  Pt will be independent with initial HEP for improved strength, balance, transfers and gait and management of his pain symptoms. Baseline: Goal status: MET  2.  Pt will improve gait velocity to at least 2.75 ft/sec for improved gait efficiency and performance at SBA level  Baseline: 2.45 ft/sec no AD (2/28), 3.74 ft/sec no AD (3/20) Goal status: MET  3.  Pt will improve 5 x STS to less than or equal to 17 seconds to demonstrate improved functional strength and transfer efficiency.  Baseline: 20 sec no UE (2/28), 14 sec no UE (3/20) Goal status: MET  4.  Pt will improve FGA to 16/30 for decreased fall risk  Baseline: 12/30 (2/28), 23/30 (3/20) Goal status: MET  5.  Vestibular system to be assessed and LTG set as appropriate Baseline:  Goal  status: MET   LONG TERM GOALS: Target date: 12/21/2023   Pt will be independent with final HEP for improved strength, balance, transfers and gait and management of his pain symptoms. Baseline:  Goal status: INITIAL  2.  Pt will improve gait velocity to at least 3.0 ft/sec for improved gait efficiency and performance at mod I level  Baseline: 2.45 ft/sec no AD (2/28), 3.74 ft/sec no AD (3/20) Goal status: MET  3.  Pt will improve 5 x STS to less than or equal to 14 seconds to demonstrate improved functional strength and transfer efficiency.  Baseline: 20 sec no UE (2/28), 14 sec no UE (3/20) Goal status: MET  4.  Pt will improve FGA to 27/30 for decreased fall risk  Baseline: 12/30 (2/28), 23/30 (3/20) Goal status: REVISED/UPGRADED  5.  Pt will report </= 1/5 for all movements on MSQ to indicate improvement in motion sensitivity and improved activity tolerance.   Baseline: 3/5 Goal status: REVISED   ASSESSMENT:  CLINICAL  IMPRESSION: Emphasis of skilled PT session on assessing STG and continuing to assess R hip pain. Pt has met 5/5 STG due to being independent with his initial HEP, improving his 5xSTS score from 20 to 14 sec, improving his FGA score from 12/30 to 23/30, and improving his gait speed from 2.45 ft/sec to 3.74 ft/sec, demonstrating significantly improved balance and decreased fall risk as well as return to PLOF in some areas. He does continue to have onset of a burning pain in his R lateral hip with short distance gait that is aggravated by activation of his hip abductors and is tender to palpation. Pt has an appointment at [facility] with an orthopedic physician on 12/04/23 to further assess his R hip and hopefully have imaging ordered. 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 Continue  POC.     OBJECTIVE IMPAIRMENTS: Abnormal gait, decreased activity tolerance, decreased knowledge of condition, decreased mobility, difficulty walking, decreased ROM, decreased strength, dizziness, impaired perceived functional ability, increased muscle spasms, impaired vision/preception, and pain.   ACTIVITY LIMITATIONS: carrying, lifting, bending, standing, stairs, transfers, and bed mobility  PARTICIPATION LIMITATIONS: meal prep, cleaning, interpersonal relationship, driving, occupation, and yard work  PERSONAL FACTORS: 3+ comorbidities:   PAD, CAD (s/p CABG in 2011), HLD, HTN, and OSA, R THAare also affecting patient's functional outcome.   REHAB POTENTIAL: Good  CLINICAL DECISION MAKING: Evolving/moderate complexity  EVALUATION COMPLEXITY: High  PLAN:  PT FREQUENCY: 1-2x/week  PT DURATION: 6 weeks  PLANNED INTERVENTIONS: 97164- PT Re-evaluation, 97110-Therapeutic exercises, 97530- Therapeutic activity, 97112- Neuromuscular re-education, 97535- Self Care, 16109- Manual therapy, [phone]- Gait training, [phone]- Electrical stimulation (manual), Patient/Family education, Balance training, Stair training, Taping, Dry Needling, Joint mobilization, Spinal mobilization, Vestibular training, Visual/preceptual remediation/compensation, DME instructions, Cryotherapy, and Moist heat  PLAN FOR NEXT SESSION: results of cervical MRI and how was appointment with Maurice Park 3/20 (neurosurgeon)? How was appointment with Dr. Madelon Lips on 3/25 for his R hip? May need to add visits to POC pending visual impairments and R hip  add to HEP to address R hip weakness as tolerated, balance impairments based on FGA (head turns, pivots/turns, stepping over obstacles); convergence tasks (straw in cup, brock string), L eye occluded saccades, [place] eye tracking (all completed with standing balance challenge such as on airex)  Peter Congo, PT Peter Congo, PT, DPT, CSRS   11/29/2023, 10:57 AM

## 2023-11-29 NOTE — Therapy (Signed)
 OUTPATIENT OCCUPATIONAL THERAPY NEURO TREATMENT & DISCHARGE NOTE  Patient Name: Maurice Park MRN: [id number] DOB:[birth year], 68 y.o., male Today's Date: 11/29/2023  PCP: Lupita Raider, MD REFERRING PROVIDER: Juliet Rude, PA-C  END OF SESSION:  OT End of Session - 11/29/23 0930     Visit Number 7    Number of Visits 13    Date for OT Re-Evaluation 12/21/23    Authorization Type [employer]    OT Start Time 0930    OT Stop Time 1015    OT Time Calculation (min) 45 min    Activity Tolerance Patient tolerated treatment well    Behavior During Therapy WFL for tasks assessed/performed             Past Medical History:  Diagnosis Date   Angina    Anxiety    Back pain    CAD (coronary artery disease), with CABG [place] to LAD, Lt. radial artery to 1st diagnal, sequential SVG to PDA and PLA, 12/20/09 April 2011   Carotid artery disease (HCC)    status post right carotid endarterectomy   Chest pain    Dysrhythmia    History of heart attack    Hyperlipidemia, controlled    Hypertension    Joint pain    Lactose intolerance    Palpitations    Peripheral vascular disease (HCC)    Renal artery stenosis (HCC)    Right hip pain    Situational mixed anxiety and depressive disorder,     Sleep apnea    SOB (shortness of breath)    Past Surgical History:  Procedure Laterality Date   ABDOMINAL AORTOGRAM W/LOWER EXTREMITY N/A 03/28/2021   Procedure: ABDOMINAL AORTOGRAM W/LOWER EXTREMITY;  Surgeon: Runell Gess, MD;  Location: [facility];  Service: Cardiovascular;  Laterality: N/A;  limited runoff   ANTERIOR CRUCIATE LIGAMENT REPAIR Left    CARDIAC CATHETERIZATION  12/14/2009   multivessel CAD >> CABG (Dr. Nicki Guadalajara)   CARDIAC CATHETERIZATION  10/03/2011   L Cfx w/80% ostial stenosis, ramus with 50% segmental mid stenosis; RCA dominant & occluded in midportion; LIMA to LAD patent; SVG to PDA/PLA patent; free L radial to diagonal functionally occluded (Dr. Erlene Quan)    CAROTID DOPPLER  11/2011   R CEA w/normal patency; left bulb w/0-49% diameter reduction   CAROTID ENDARTERECTOMY  05/03/2006   Dr. Tawanna Cooler Early   CORONARY ANGIOPLASTY  02/21/2000   PTCA with 3.25x57mm Quantum Monorail balloon of in-stent posterolateral branch restenosis, reduced from 60% to under 20% (Dr. Erlene Quan)   CORONARY ARTERY BYPASS GRAFT  12/20/2009   LIMA-LAD, left radial-1st diagonal, SVG-PDA & PLA (Dr. Kathie Rhodes. Hendrickson)   HIP ARTHROSCOPY W/ LABRAL REPAIR     LEFT HEART CATHETERIZATION WITH CORONARY/GRAFT ANGIOGRAM N/A 10/03/2011   Procedure: LEFT HEART CATHETERIZATION WITH Isabel Caprice;  Surgeon: Runell Gess, MD;  Location: [facility] CATH LAB;  Service: Cardiovascular;  Laterality: N/A;   LOWER EXTREMITY ARTERIAL DOPPLER  11/2005   normal study    NM MYOCAR IMG MI  04/2011   bruce myoview - normal perfusion; EF 58%; low risk scan   PERIPHERAL VASCULAR INTERVENTION  03/28/2021   Procedure: PERIPHERAL VASCULAR INTERVENTION;  Surgeon: Runell Gess, MD;  Location: [facility];  Service: Cardiovascular;;  Right common iliac stent   RENAL DOPPLER  12/2012   right renal stent w/1-59% diameter reduction   TRANSTHORACIC ECHOCARDIOGRAM  04/2011   EF=>55%; trace MR; mild TR   Patient Active Problem List  Diagnosis Date Noted   Traumatic subarachnoid hemorrhage (HCC) 10/24/2023   Peripheral arterial disease (HCC) 03/11/2021   Numbness and tingling of right leg 01/27/2021   Costochondritis 11/15/2020   Metabolic syndrome 11/11/2020   Prediabetes 10/18/2020   Class 1 obesity with serious comorbidity and body mass index (BMI) of 32.0 to 32.9 in adult 10/18/2020   Essential hypertension 07/02/2018   Renal artery stenosis (HCC) 07/24/2013   Carotid artery disease (HCC) 07/24/2013   Angina at rest [facility]) 10/02/2011   CAD (coronary artery disease), with CABG [place] to LAD, Lt. radial artery to 1st diagnal, sequential SVG to PDA and PLA, 12/20/09 10/02/2011   Hyperlipidemia,  controlled 10/02/2011   Situational mixed anxiety and depressive disorder,  10/02/2011   ONSET DATE: 10/26/2023 (Date of referral)  REFERRING DIAG: S06.6XAA (ICD-10-CM) - Traumatic subarachnoid hemorrhage with unknown loss of consciousness status, initial encounter   THERAPY DIAG:  Visuospatial deficit  Other symptoms and signs involving the nervous system  Traumatic subarachnoid hemorrhage with unknown loss of consciousness status, initial encounter (HCC)  Rationale for Evaluation and Treatment: Rehabilitation  SUBJECTIVE:   SUBJECTIVE STATEMENT: Pt initially reported that he hadn't done any of his exercises yesterday.  Upon inquiry about his daily routine, he reported riding in the care > 1 hour each direction, taking out and caring for the dog, contacting his boss and following through with hs disability claim, working on bills, and finishing a puzzle he had ben given.   Pt accompanied by: friend - Kim, also OT  PERTINENT HISTORY: PMH includes: PAD, CAD (s/p CABG in 2011), HLD, HTN, and OSA, R THA   Pt admitted to [facility] on 10/24/2023 after a ground level fall when playing tennis. Scans revealed a traumatic subarachnoid hemorrhage and an occipital fx   PRECAUTIONS: Fall and Other: concussion  WEIGHT BEARING RESTRICTIONS: No  PAIN:  Are you having pain? Yes: NPRS scale: 2/10 Pain location: LLE Pain description: sore Aggravating factors: fatigue Relieving factors: rest, medications  FALLS: Has patient fallen in last 6 months? Yes. Number of falls 1 - fell when playing tennis, resulting in [facility]  LIVING ENVIRONMENT: Family/patient expects to be discharged to:: Private residence Living Arrangements: Spouse/significant other Available Help at Discharge: Family Type of Home: House Home Access: Other (comment) (2 steps in house, no railings to support) Home Layout: One level Bathroom Shower/Tub: Health visitor: Handicapped height Bathroom Accessibility:  Yes Home Equipment: Crutches;Shower seat - built in;Grab bars - tub/shower Additional Comments: dog that is blind 6 yo  Lives With: Spouse    PLOF: Independent; was driving; Electronics engineer with lots of computer, phone, and video chats  PATIENT GOALS: return to playing guitar and outdoor work  OBJECTIVE:  Note: Objective measures were completed at Evaluation unless otherwise noted.  HAND DOMINANCE: Right  ADLs: Eating: is cautious around sharp items such as a knife Grooming: unable to bend forward to spit into sink UB Dressing: I LB Dressing: requires help to don shoes and socks as reaching provokes symptoms Toileting: mod I Bathing: mod I Equipment: Grab bars, Walk in shower, Reacher, and HHSH  IADLs: Shopping: has not tried Light housekeeping: light housekeeping only Meal Prep: dependent Community mobility: dependent Medication management: mod I Financial management: mod I Handwriting:  no change  MOBILITY STATUS: Needs Assist: Rollator for long distances, cane in home  ACTIVITY TOLERANCE: Activity tolerance: poor  FUNCTIONAL OUTCOME MEASURES: PSFS: 1.3 total score  11/27/2023: PSFS: 7.7 total score   Total score =  sum of the activity scores/number of activities Minimum detectable change (90%CI) for average score = 2 points Minimum detectable change (90%CI) for single activity score = 3 points  UPPER EXTREMITY ROM and MMT:    BUE: WNL  HAND FUNCTION: Grip strength: Right: 72.3 lbs; Left: 69.6 lbs  COORDINATION: 9 Hole Peg test: Right: 25 sec; Left: 22 sec  SENSATION: No paresthesias in hands  EDEMA: none reported or observed  MUSCLE TONE: BUE WFL  COGNITION: Overall cognitive status: Within functional limits for tasks assessed  VISION: Subjective report: no change noticed other than fatigue Baseline vision: Bifocals, Wears glasses all the time, and progressive lenses Visual history:  none  VISION ASSESSMENT: WFL  PERCEPTION: Not  tested  PRAXIS: Not tested  OBSERVATIONS: Pt appears well-kept though keeps eyes closed off and on throughout visit. Dons glasses for near activities and doffs for the remainder of the time. Ambulates with use of rollator and has cane with him. Pt demonstrating guarding of neck while turning to sides but brings head to objects putting neck into flexion.                                                                                                                           TODAY'S TREATMENT :    OT reviewed management strategies for concussion symptoms for discharge from OT today.  Ideas provided and reviewed re: activity modification ie) changing things to help improve tolerance to driving and group settings.  Also reviewed gradual increase in functional activities, gradual return to work when released by MD and compensating for sensory overload.  Various ideas provided for home based activities for visual and cognitive progression with practice of visual scanning activity with good success finding 10 items in visual distracting image in < 2 minutes.  Additional ideas reviewed including using puzzles (something new to him to work on visual scanning, attention and facilitating looking to his R side. And hidden picture ideas, data retrieval and process of elimination tasks provided for HEP at home.    PATIENT EDUCATION: Education details:  DC instructions re: management strategies Person educated: Patient and friend Education method: Explanation, Demonstration, and Verbal cues Education comprehension: verbalized understanding, returned demonstration, verbal cues required, and needs further education  HOME EXERCISE PROGRAM: 11/13/2023: visual scanning activities  GOALS:  SHORT TERM GOALS: Target date: 12/05/2023    Patient will demonstrate HEP with visual handouts only for proper execution.  Baseline: Goal status: MET  2.  Pt will verbalize understanding of stress reduction techniques as  needed to promote needed rest following concussion/SDH.  Baseline:  Goal status: MET  3.  Pt will return demonstration of LB dressing with modifications/use of AD as needed to improve occupational performance.  Baseline:  Goal status: MET   LONG TERM GOALS: Target date: 12/21/2023  Patient will report at least two-point increase in average PSFS score or at least three-point increase in a single activity score indicating functionally significant improvement given minimum detectable  change.  Baseline: 1.3 total score (See above for individual activity scores)  11/27/2023: PSFS: 7.7 total score Goal status: MET   ASSESSMENT:  CLINICAL IMPRESSION: Patient demonstrates good progression towards goals and is in agreement with d/c at this visit.  PERFORMANCE DEFICITS: in functional skills including ADLs, IADLs, pain, muscle spasms, body mechanics, endurance, decreased knowledge of use of DME, vision, and vestibular.   IMPAIRMENTS: are limiting patient from ADLs, IADLs, rest and sleep, work, leisure, and social participation.   CO-MORBIDITIES: may have co-morbidities  that affects occupational performance. Patient will benefit from skilled OT to address above impairments and improve overall function.  REHAB POTENTIAL: Good   OCCUPATIONAL THERAPY DISCHARGE SUMMARY  Visits from Start of Care: 7  Current functional level related to goals / functional outcomes: Pt has met all goals to satisfactory levels and is pleased with outcomes.   Remaining deficits: Pt has some ongoing functional deficits with tolerance to sensory input, visual scanning to R and activity tolerance but is satisfied with progress and ability to continue recommendations at home.  Education / Equipment: Pt has all needed materials and education. Pt understands how to continue on with self-management. See tx notes for more details.   Patient agrees to discharge due to max benefits received from outpatient occupational  therapy at this time.    Victorino Sparrow, OT 11/29/2023, 5:51 PM

## 2023-12-03 ENCOUNTER — Ambulatory Visit: Admitting: Speech Pathology

## 2023-12-05 ENCOUNTER — Ambulatory Visit: Payer: 59 | Admitting: Physical Therapy

## 2023-12-05 ENCOUNTER — Encounter: Payer: Self-pay | Admitting: Physical Therapy

## 2023-12-05 ENCOUNTER — Encounter

## 2023-12-05 ENCOUNTER — Encounter: Payer: 59 | Admitting: Occupational Therapy

## 2023-12-05 VITALS — BP 153/84 | HR 70

## 2023-12-05 DIAGNOSIS — M6281 Muscle weakness (generalized): Secondary | ICD-10-CM | POA: Diagnosis not present

## 2023-12-05 DIAGNOSIS — R2689 Other abnormalities of gait and mobility: Secondary | ICD-10-CM

## 2023-12-05 DIAGNOSIS — R42 Dizziness and giddiness: Secondary | ICD-10-CM

## 2023-12-05 DIAGNOSIS — M79605 Pain in left leg: Secondary | ICD-10-CM

## 2023-12-05 DIAGNOSIS — M25551 Pain in right hip: Secondary | ICD-10-CM

## 2023-12-05 DIAGNOSIS — R2681 Unsteadiness on feet: Secondary | ICD-10-CM

## 2023-12-05 NOTE — Therapy (Signed)
 OUTPATIENT PHYSICAL THERAPY NEURO TREATMENT   Patient Name: Maurice Park MRN: [id number] DOB:Dec 19, 1955, 68 y.o., male Today's Date: 12/05/2023   PCP: Maurice Raider, MD REFERRING PROVIDER: Juliet Rude, PA-C  END OF SESSION:  PT End of Session - 12/05/23 1018     Visit Number 8    Number of Visits 13    Date for PT Re-Evaluation 01/04/24    Authorization Type [employer]    PT Start Time 1018    PT Stop Time 1100    PT Time Calculation (min) 42 min    Equipment Utilized During Treatment Gait belt    Activity Tolerance Patient tolerated treatment well    Behavior During Therapy WFL for tasks assessed/performed             Past Medical History:  Diagnosis Date   Angina    Anxiety    Back pain    CAD (coronary artery disease), with CABG [place] to LAD, Lt. radial artery to 1st diagnal, sequential SVG to PDA and PLA, 12/20/09 April 2011   Carotid artery disease (HCC)    status post right carotid endarterectomy   Chest pain    Dysrhythmia    History of heart attack    Hyperlipidemia, controlled    Hypertension    Joint pain    Lactose intolerance    Palpitations    Peripheral vascular disease (HCC)    Renal artery stenosis (HCC)    Right hip pain    Situational mixed anxiety and depressive disorder,     Sleep apnea    SOB (shortness of breath)    Past Surgical History:  Procedure Laterality Date   ABDOMINAL AORTOGRAM W/LOWER EXTREMITY N/A 03/28/2021   Procedure: ABDOMINAL AORTOGRAM W/LOWER EXTREMITY;  Surgeon: Runell Gess, MD;  Location: [facility];  Service: Cardiovascular;  Laterality: N/A;  limited runoff   ANTERIOR CRUCIATE LIGAMENT REPAIR Left    CARDIAC CATHETERIZATION  12/14/2009   multivessel CAD >> CABG (Dr. Nicki Guadalajara)   CARDIAC CATHETERIZATION  10/03/2011   L Cfx w/80% ostial stenosis, ramus with 50% segmental mid stenosis; RCA dominant & occluded in midportion; LIMA to LAD patent; SVG to PDA/PLA patent; free L radial to diagonal  functionally occluded (Dr. Erlene Quan)   CAROTID DOPPLER  11/2011   R CEA w/normal patency; left bulb w/0-49% diameter reduction   CAROTID ENDARTERECTOMY  05/03/2006   Dr. Tawanna Cooler Early   CORONARY ANGIOPLASTY  02/21/2000   PTCA with 3.25x59mm Quantum Monorail balloon of in-stent posterolateral branch restenosis, reduced from 60% to under 20% (Dr. Erlene Quan)   CORONARY ARTERY BYPASS GRAFT  12/20/2009   LIMA-LAD, left radial-1st diagonal, SVG-PDA & PLA (Dr. Kathie Rhodes. Hendricks[place])   HIP ARTHROSCOPY W/ LABRAL REPAIR     LEFT HEART CATHETERIZATION WITH CORONARY/GRAFT ANGIOGRAM N/A 10/03/2011   Procedure: LEFT HEART CATHETERIZATION WITH Isabel Caprice;  Surgeon: Runell Gess, MD;  Location: [facility] CATH [facility];  Service: Cardiovascular;  Laterality: N/A;   LOWER EXTREMITY ARTERIAL DOPPLER  11/2005   normal study    NM MYOCAR IMG MI  04/2011   bruce myoview - normal perfusion; EF 58%; low risk scan   PERIPHERAL VASCULAR INTERVENTION  03/28/2021   Procedure: PERIPHERAL VASCULAR INTERVENTION;  Surgeon: Runell Gess, MD;  Location: [facility];  Service: Cardiovascular;;  Right common iliac stent   RENAL DOPPLER  12/2012   right renal stent w/1-59% diameter reduction   TRANSTHORACIC ECHOCARDIOGRAM  04/2011   EF=>55%; trace MR;  mild TR   Patient Active Problem List   Diagnosis Date Noted   Traumatic subarachnoid hemorrhage (HCC) 10/24/2023   Peripheral arterial disease (HCC) 03/11/2021   Numbness and tingling of right leg 01/27/2021   Costochondritis 11/15/2020   Metabolic syndrome 11/11/2020   Prediabetes 10/18/2020   Class 1 obesity with serious comorbidity and body mass index (BMI) of 32.0 to 32.9 in adult 10/18/2020   Essential hypertension 07/02/2018   Renal artery stenosis (HCC) 07/24/2013   Carotid artery disease (HCC) 07/24/2013   Angina at rest [facility]) 10/02/2011   CAD (coronary artery disease), with CABG [place] to LAD, Lt. radial artery to 1st diagnal, sequential SVG to PDA and  PLA, 12/20/09 10/02/2011   Hyperlipidemia, controlled 10/02/2011   Situational mixed anxiety and depressive disorder,  10/02/2011    ONSET DATE: 10/26/2023 (referral date)  REFERRING DIAG: S06.6XAA (ICD-10-CM) - Traumatic subarachnoid hemorrhage with unknown loss of consciousness status, initial encounter (HCC)  THERAPY DIAG:  Muscle weakness (generalized)  Other abnormalities of gait and mobility  Unsteadiness on feet  Pain in left leg  Pain in right hip  Dizziness and giddiness  Rationale for Evaluation and Treatment: Rehabilitation  SUBJECTIVE:                                                                                                                                                                                             SUBJECTIVE STATEMENT: "Maurice Park"  Patient is going back part-time to work next week and full timon the 14th. He reports vision is better and now can see thumb off to the side. Patient reports that he got a shot in his hip but has not noticed major improvements on that side. Patient had x-rays done but may have a closer look taken on if still not improved in a few weeks. Patient reports forgot BP medication this morning. Denies falls and near falls since last here.   Pt accompanied by: self  PERTINENT HISTORY:  PMH includes: PAD, CAD (s/p CABG in 2011), HLD, HTN, and OSA, R THA  Pt admitted to [facility] on 10/24/2023 after a ground level fall when playing tennis. Scans revealed a traumatic subarachnoid hemorrhage and an occipital fx.   PAIN:  Are you having pain? 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  DIAGNOSTIC  FINDINGS:  Cervical MRI 11/08/23, pending results  Cervical Spine Xray 10/25/23 IMPRESSION: 1. No acute findings. 2. Mild retrolisthesis at C5-6 without dynamic instability.  Head CT 10/24/13 FINDINGS: Brain: Scattered subarachnoid hemorrhage along the cerebral convexities, left sylvian fissure, and inter peduncular cistern is diffusely and mildly increased. Some of this change is related to redistribution with left subarachnoid blood seen along the inferior frontal lobes. Suspect mild inferior frontal lobe hemorrhage and edema from contusion, hemorrhage on the left measuring up to 5 mm. The pattern is compatible with history of head trauma. No evidence of infarct, hydrocephalus, or mass   Vascular: No hyperdense vessel or unexpected calcification.   Skull: Vertical, nondisplaced right occipital bone fracture, known.   Sinuses/Orbits: Generalized mucosal thickening in the ethmoid, sphenoid, and frontal sinuses.   IMPRESSION: 1. Scattered subarachnoid hemorrhage with mild increase/redistribution from yesterday. 2. Possible small inferior frontal lobe contusions. 3. Known occipital bone fracture that is nondepressed.  Head CT 10/30/2023 IMPRESSION: 1. Unchanged appearance of small anterior frontal lobe hemorrhagic contusions. 2. Unchanged appearance of nondisplaced right occipital fracture.   TREATMENT:  Self Care: Vitals:   12/05/23 1029  BP: (!) 153/84  Pulse: 70   Seated on RUE Assessed vitals as noted above, elevated but WFL for therapy, discussed BP management and importance, patient verbalized understanding  Discussed plan moving forward and updates on hip, patient hoping to emphasize balance deficits moving forward   NMR:   Between // bars for safety with balance [place] eye tracking  Standing on airex NBOS feet together, no eye occlusion - 52 seconds without misses Standing on balance beam NBOS feet together, no eye occlusion - 46  seconds Standing on black foam standard BOS, L eye occluded with folded card behind glasses Round 1 - unable to complete and skips 3 whole lines Round 2: Repeated and completes in 58 seconds  Brock String in standing Single bead making X - 5 times (challenges unless ~20 cm from face) 3 Bead working on yellow middle bead only making X - 2x   PATIENT EDUCATION: Education details: continue HEP, PT POC, results of OM and functional implications Person educated: Patient Education method: Explanation and Demonstration Education comprehension: verbalized understanding, returned demonstration, and needs further education  HOME EXERCISE PROGRAM: Access Code: [email or web address] URL: [email or web address] Date: 11/12/2023 Prepared by: Peter Congo  Exercises - Seated Hamstring Stretch with Strap  - 1 x daily - 7 x weekly - 1 sets - 3-5 reps - 30-60 hold - Hamstring Mobilization with Foam Roll  - 1-2 x daily - 7 x weekly - 1 sets - 10 reps - Gluteus Mobilization with Foam Roll  - 1-2 x daily - 7 x weekly - 1 sets - 10 reps - Clamshell  - 1 x daily - 7 x weekly - 3 sets - 5-10 reps - Sidelying Diagonal Hip Abduction  - 1 x daily - 7 x weekly - 3 sets - 5 reps   Keeping eyes on target on wall 5 feet away, tilt head down 15-30 and move head side to side for __30__ seconds. Repeat while moving head up and down for __30__ seconds. Do __3__ sessions per day. Oculomotor: Saccades    Holding two targets positioned side by side ___6_ inches apart, move eyes quickly from target to target as head stays still.  Perform sitting.  Do ___3_ sessions per day.  Brock String 10x - make X with single bead with breaks between reps daily [place] eye tracking 1x day with L  eye occluded   GOALS: Goals reviewed with patient? Yes  SHORT TERM GOALS: Target date: 11/30/2023  Pt will be independent with initial HEP for improved strength, balance, transfers and gait and management of his pain  symptoms. Baseline: Goal status: MET  2.  Pt will improve gait velocity to at least 2.75 ft/sec for improved gait efficiency and performance at SBA level  Baseline: 2.45 ft/sec no AD (2/28), 3.74 ft/sec no AD (3/20) Goal status: MET  3.  Pt will improve 5 x STS to less than or equal to 17 seconds to demonstrate improved functional strength and transfer efficiency.  Baseline: 20 sec no UE (2/28), 14 sec no UE (3/20) Goal status: MET  4.  Pt will improve FGA to 16/30 for decreased fall risk  Baseline: 12/30 (2/28), 23/30 (3/20) Goal status: MET  5.  Vestibular system to be assessed and LTG set as appropriate Baseline:  Goal status: MET   LONG TERM GOALS: Target date: 12/21/2023   Pt will be independent with final HEP for improved strength, balance, transfers and gait and management of his pain symptoms. Baseline:  Goal status: INITIAL  2.  Pt will improve gait velocity to at least 3.0 ft/sec for improved gait efficiency and performance at mod I level  Baseline: 2.45 ft/sec no AD (2/28), 3.74 ft/sec no AD (3/20) Goal status: MET  3.  Pt will improve 5 x STS to less than or equal to 14 seconds to demonstrate improved functional strength and transfer efficiency.  Baseline: 20 sec no UE (2/28), 14 sec no UE (3/20) Goal status: MET  4.  Pt will improve FGA to 27/30 for decreased fall risk  Baseline: 12/30 (2/28), 23/30 (3/20) Goal status: REVISED/UPGRADED  5.  Pt will report </= 1/5 for all movements on MSQ to indicate improvement in motion sensitivity and improved activity tolerance.   Baseline: 3/5 Goal status: REVISED   ASSESSMENT:  CLINICAL IMPRESSION: Emphasis of skilled physical therapy services on working on visual deficits with balance challenges. Patient R vision improved form last assessment but continues to demonstrate challenges with R eye tracking with L eye visual occlusion. Patient misses 3 lines on [place] tracking with R eye alone until slows pace. Continue  POC.   OBJECTIVE IMPAIRMENTS: Abnormal gait, decreased activity tolerance, decreased knowledge of condition, decreased mobility, difficulty walking, decreased ROM, decreased strength, dizziness, impaired perceived functional ability, increased muscle spasms, impaired vision/preception, and pain.   ACTIVITY LIMITATIONS: carrying, lifting, bending, standing, stairs, transfers, and bed mobility  PARTICIPATION LIMITATIONS: meal prep, cleaning, interpersonal relationship, driving, occupation, and yard work  PERSONAL FACTORS: 3+ comorbidities:   PAD, CAD (s/p CABG in 2011), HLD, HTN, and OSA, R THAare also affecting patient's functional outcome.   REHAB POTENTIAL: Good  CLINICAL DECISION MAKING: Evolving/moderate complexity  EVALUATION COMPLEXITY: High  PLAN:  PT FREQUENCY: 1-2x/week  PT DURATION: 6 weeks  PLANNED INTERVENTIONS: 97164- PT Re-evaluation, 97110-Therapeutic exercises, 97530- Therapeutic activity, 97112- Neuromuscular re-education, 97535- Self Care, 86578- Manual therapy, [phone]- Gait training, [phone]- Electrical stimulation (manual), Patient/Family education, Balance training, Stair training, Taping, Dry Needling, Joint mobilization, Spinal mobilization, Vestibular training, Visual/preceptual remediation/compensation, DME instructions, Cryotherapy, and Moist heat  PLAN FOR NEXT SESSION: results of cervical MRI and how was appointment with Dr. Yetta Barre 3/20 (neurosurgeon)? How was appointment with Dr. Madelon Lips on 3/25 for his R hip? May need to add visits to POC pending visual impairments and R hip  add to HEP to address R hip weakness as tolerated, balance impairments based on  FGA (head turns, pivots/turns, stepping over obstacles); convergence tasks (straw in cup, brock string), L eye occluded saccades, [place] eye tracking (all completed with standing balance challenge such as on airex)  Carmelia Bake, PT Peter Congo, PT, DPT, CSRS   12/05/2023, 12:33 PM

## 2023-12-07 ENCOUNTER — Encounter: Payer: 59 | Admitting: Occupational Therapy

## 2023-12-07 ENCOUNTER — Encounter: Payer: Self-pay | Admitting: Physical Therapy

## 2023-12-07 ENCOUNTER — Ambulatory Visit: Payer: 59 | Admitting: Physical Therapy

## 2023-12-07 VITALS — BP 158/84 | HR 61

## 2023-12-07 DIAGNOSIS — M6281 Muscle weakness (generalized): Secondary | ICD-10-CM

## 2023-12-07 DIAGNOSIS — R2681 Unsteadiness on feet: Secondary | ICD-10-CM

## 2023-12-07 DIAGNOSIS — R2689 Other abnormalities of gait and mobility: Secondary | ICD-10-CM

## 2023-12-07 NOTE — Therapy (Signed)
 OUTPATIENT PHYSICAL THERAPY NEURO TREATMENT   Patient Name: Maurice Park MRN: [id number] DOB:Dec 28, 1955, 68 y.o., male Today's Date: 12/07/2023  PCP: Maurice Raider, MD REFERRING PROVIDER: Juliet Rude, PA-C  END OF SESSION:  PT End of Session - 12/07/23 1109     Visit Number 9    Number of Visits 13    Date for PT Re-Evaluation 01/04/24    Authorization Type [employer]    PT Start Time 1107    PT Stop Time 1148    PT Time Calculation (min) 41 min    Equipment Utilized During Treatment Gait belt    Activity Tolerance Patient tolerated treatment well    Behavior During Therapy WFL for tasks assessed/performed             Past Medical History:  Diagnosis Date   Angina    Anxiety    Back pain    CAD (coronary artery disease), with CABG [place] to LAD, Lt. radial artery to 1st diagnal, sequential SVG to PDA and PLA, 12/20/09 April 2011   Carotid artery disease (HCC)    status post right carotid endarterectomy   Chest pain    Dysrhythmia    History of heart attack    Hyperlipidemia, controlled    Hypertension    Joint pain    Lactose intolerance    Palpitations    Peripheral vascular disease (HCC)    Renal artery stenosis (HCC)    Right hip pain    Situational mixed anxiety and depressive disorder,     Sleep apnea    SOB (shortness of breath)    Past Surgical History:  Procedure Laterality Date   ABDOMINAL AORTOGRAM W/LOWER EXTREMITY N/A 03/28/2021   Procedure: ABDOMINAL AORTOGRAM W/LOWER EXTREMITY;  Surgeon: Maurice Gess, MD;  Location: [facility];  Service: Cardiovascular;  Laterality: N/A;  limited runoff   ANTERIOR CRUCIATE LIGAMENT REPAIR Left    CARDIAC CATHETERIZATION  12/14/2009   multivessel CAD >> CABG ([facility])   CARDIAC CATHETERIZATION  10/03/2011   L Cfx w/80% ostial stenosis, ramus with 50% segmental mid stenosis; RCA dominant & occluded in midportion; LIMA to LAD patent; SVG to PDA/PLA patent; free L radial to diagonal functionally  occluded (Maurice Park)   CAROTID DOPPLER  11/2011   R CEA w/normal patency; left bulb w/0-49% diameter reduction   CAROTID ENDARTERECTOMY  05/03/2006   [facility]   CORONARY ANGIOPLASTY  02/21/2000   PTCA with 3.25x28mm Quantum Monorail balloon of in-stent posterolateral branch restenosis, reduced from 60% to under 20% (Maurice Park)   CORONARY ARTERY BYPASS GRAFT  12/20/2009   LIMA-LAD, left radial-1st diagonal, SVG-PDA & PLA (Maurice Park)   HIP ARTHROSCOPY W/ LABRAL REPAIR     LEFT HEART CATHETERIZATION WITH CORONARY/GRAFT ANGIOGRAM N/A 10/03/2011   Procedure: LEFT HEART CATHETERIZATION WITH Isabel Caprice;  Surgeon: Maurice Gess, MD;  Location: [facility] CATH LAB;  Service: Cardiovascular;  Laterality: N/A;   LOWER EXTREMITY ARTERIAL DOPPLER  11/2005   normal study    NM MYOCAR IMG MI  04/2011   bruce myoview - normal perfusion; EF 58%; low risk scan   PERIPHERAL VASCULAR INTERVENTION  03/28/2021   Procedure: PERIPHERAL VASCULAR INTERVENTION;  Surgeon: Maurice Gess, MD;  Location: [facility];  Service: Cardiovascular;;  Right common iliac stent   RENAL DOPPLER  12/2012   right renal stent w/1-59% diameter reduction   TRANSTHORACIC ECHOCARDIOGRAM  04/2011   EF=>55%; trace MR; mild  TR   Patient Active Problem List   Diagnosis Date Noted   Traumatic subarachnoid hemorrhage (HCC) 10/24/2023   Peripheral arterial disease (HCC) 03/11/2021   Numbness and tingling of right leg 01/27/2021   Costochondritis 11/15/2020   Metabolic syndrome 11/11/2020   Prediabetes 10/18/2020   Class 1 obesity with serious comorbidity and body mass index (BMI) of 32.0 to 32.9 in adult 10/18/2020   Essential hypertension 07/02/2018   Renal artery stenosis (HCC) 07/24/2013   Carotid artery disease (HCC) 07/24/2013   Angina at rest [facility]) 10/02/2011   CAD (coronary artery disease), with CABG [place] to LAD, Lt. radial artery to 1st diagnal, sequential SVG to PDA and PLA, 12/20/09  10/02/2011   Hyperlipidemia, controlled 10/02/2011   Situational mixed anxiety and depressive disorder,  10/02/2011    ONSET DATE: 10/26/2023 (referral date)  REFERRING DIAG: S06.6XAA (ICD-10-CM) - Traumatic subarachnoid hemorrhage with unknown loss of consciousness status, initial encounter (HCC)  THERAPY DIAG:  Muscle weakness (generalized)  Other abnormalities of gait and mobility  Unsteadiness on feet  Rationale for Evaluation and Treatment: Rehabilitation  SUBJECTIVE:                                                                                                                                                                                             SUBJECTIVE STATEMENT: "Maurice Park"  Patient reports after next visit wants to go on hold to see how does with return to work; report hip is still very aggravated and has MRI scheduled in 2 weeks. Denies falls and near falls since last here. Reports has been working on visual exercises.  Pt accompanied by: self  PERTINENT HISTORY:  PMH includes: PAD, CAD (s/p CABG in 2011), HLD, HTN, and OSA, R THA  Pt admitted to [facility] on 10/24/2023 after a ground level fall when playing tennis. Scans revealed a traumatic subarachnoid hemorrhage and an occipital fx.   PAIN:  Are you having pain? 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  DIAGNOSTIC FINDINGS:  Cervical MRI 11/08/23, pending results  Cervical Spine Xray 10/25/23 IMPRESSION: 1. No acute findings. 2. Mild retrolisthesis at C5-6 without dynamic instability.  Head CT 10/24/13 FINDINGS: Brain: Scattered subarachnoid hemorrhage along  the cerebral convexities, left sylvian fissure, and inter peduncular cistern is diffusely and mildly increased. Some of  this change is related to redistribution with left subarachnoid blood seen along the inferior frontal lobes. Suspect mild inferior frontal lobe hemorrhage and edema from contusion, hemorrhage on the left measuring up to 5 mm. The pattern is compatible with history of head trauma. No evidence of infarct, hydrocephalus, or mass   Vascular: No hyperdense vessel or unexpected calcification.   Skull: Vertical, nondisplaced right occipital bone fracture, known.   Sinuses/Orbits: Generalized mucosal thickening in the ethmoid, sphenoid, and frontal sinuses.   IMPRESSION: 1. Scattered subarachnoid hemorrhage with mild increase/redistribution from yesterday. 2. Possible small inferior frontal lobe contusions. 3. Known occipital bone fracture that is nondepressed.  Head CT 10/30/2023 IMPRESSION: 1. Unchanged appearance of small anterior frontal lobe hemorrhagic contusions. 2. Unchanged appearance of nondisplaced right occipital fracture.   TREATMENT:  Self Care: Vitals:   12/07/23 1127  BP: (!) 158/84  Pulse: 61  Seated on RUE Assessed vitals as noted above, elevated but [place] for therapy, reviewed BP management and importance, patient verbalized understanding, strongly recommend follow up with PCP for management  Discussed POC moving forward including going on hold Discussed visual breaks with return to work and keeping symptoms 3 or below from baseline, discussed looking away for 1 minute every 10 minutes with screen time  Recommend follow up with neurologist about follow up with neuropthomology in future given ongoing visual deficits in R eye, discussed modifications that may be suggested such as prism glasses but differed to opthomology  NMR:   Between // bars for safety with balance Mal Amabile String seated (reviewed progressions patient able to add in future)   Single bead making X - 5 times moving first bead closer with time (progresses to approximately 10 cm from nose)  3 bead ladder progression 2x during session Introduced head turns and string moving separate - trialed 1x but too challenging at this time, discussed as progression in future  Gait:   GAIT: Gait pattern: step through pattern, antalgic, trendelenburg, and wide BOS Distance walked: various clinic distances, 1 x 345 feet with SPC Assistive device utilized: None and Single point cane Level of assistance: Modified independence once train sequencing Comments: recommend use of SPC at this time and train on proper sequencing, patient had been using cane in R hand but given hip requires use in L hand, reports significant reduction in pain when PT suggests moving cane to L hand   PATIENT EDUCATION: Education details: continue HEP, PT POC, results of OM and functional implications Person educated: Patient Education method: Explanation and Demonstration Education comprehension: verbalized understanding, returned demonstration, and needs further education  HOME EXERCISE PROGRAM: Access Code: [email or web address] URL: [email or web address] Date: 11/12/2023 Prepared by: Peter Congo  Exercises - Seated Hamstring Stretch with Strap  - 1 x daily - 7 x weekly - 1 sets - 3-5 reps - 30-60 hold - Hamstring Mobilization with Foam Roll  - 1-2 x daily - 7 x weekly - 1 sets - 10 reps - Gluteus Mobilization with Foam Roll  - 1-2 x daily - 7 x weekly - 1 sets - 10 reps - Clamshell  - 1 x daily - 7 x weekly - 3 sets - 5-10 reps - Sidelying Diagonal Hip Abduction  - 1 x daily - 7 x weekly - 3 sets - 5 reps   Keeping eyes on target on wall 5 feet away, tilt head down 15-30 and move head side to side for __30__ seconds. Repeat while moving head up and down for __30__ seconds. Do __3__ sessions per  day. Oculomotor: Saccades    Holding two targets positioned side by side ___6_ inches apart,  move eyes quickly from target to target as head stays still.  Perform sitting.  Do ___3_ sessions per day.  Brock String 10x - make X with single bead with breaks between reps daily [place] eye tracking 1x day with L eye occluded   GOALS: Goals reviewed with patient? Yes  SHORT TERM GOALS: Target date: 11/30/2023  Pt will be independent with initial HEP for improved strength, balance, transfers and gait and management of his pain symptoms. Baseline: Goal status: MET  2.  Pt will improve gait velocity to at least 2.75 ft/sec for improved gait efficiency and performance at SBA level  Baseline: 2.45 ft/sec no AD (2/28), 3.74 ft/sec no AD (3/20) Goal status: MET  3.  Pt will improve 5 x STS to less than or equal to 17 seconds to demonstrate improved functional strength and transfer efficiency.  Baseline: 20 sec no UE (2/28), 14 sec no UE (3/20) Goal status: MET  4.  Pt will improve FGA to 16/30 for decreased fall risk  Baseline: 12/30 (2/28), 23/30 (3/20) Goal status: MET  5.  Vestibular system to be assessed and LTG set as appropriate Baseline:  Goal status: MET   LONG TERM GOALS: Target date: 12/21/2023   Pt will be independent with final HEP for improved strength, balance, transfers and gait and management of his pain symptoms. Baseline:  Goal status: INITIAL  2.  Pt will improve gait velocity to at least 3.0 ft/sec for improved gait efficiency and performance at mod I level  Baseline: 2.45 ft/sec no AD (2/28), 3.74 ft/sec no AD (3/20) Goal status: MET  3.  Pt will improve 5 x STS to less than or equal to 14 seconds to demonstrate improved functional strength and transfer efficiency.  Baseline: 20 sec no UE (2/28), 14 sec no UE (3/20) Goal status: MET  4.  Pt will improve FGA to 27/30 for decreased fall risk  Baseline: 12/30 (2/28), 23/30 (3/20) Goal status: REVISED/UPGRADED  5.  Pt will report </= 1/5 for all movements on MSQ to indicate improvement in motion  sensitivity and improved activity tolerance.   Baseline: 3/5 Goal status: REVISED   ASSESSMENT:  CLINICAL IMPRESSION: Emphasis of skilled physical therapy session on review of BP safety, symptom management for return to work, gait training with [facility] for hip pain management until MRI results, and review of progression of Brock string. Patient improved on Mal Amabile string use since prior session but unable to progress to moving string or head turns. Also recommend follow up with PCP for BP management and performed trial of gait with SPC in L hand which drastically reduced hip pain. Recommend use of cane PRN for hip pain at this time. Continue POC.   OBJECTIVE IMPAIRMENTS: Abnormal gait, decreased activity tolerance, decreased knowledge of condition, decreased mobility, difficulty walking, decreased ROM, decreased strength, dizziness, impaired perceived functional ability, increased muscle spasms, impaired vision/preception, and pain.   ACTIVITY LIMITATIONS: carrying, lifting, bending, standing, stairs, transfers, and bed mobility  PARTICIPATION LIMITATIONS: meal prep, cleaning, interpersonal relationship, driving, occupation, and yard work  PERSONAL FACTORS: 3+ comorbidities:   PAD, CAD (s/p CABG in 2011), HLD, HTN, and OSA, R THAare also affecting patient's functional outcome.   REHAB POTENTIAL: Good  CLINICAL DECISION MAKING: Evolving/moderate complexity  EVALUATION COMPLEXITY: High  PLAN:  PT FREQUENCY: 1-2x/week  PT DURATION: 6 weeks  PLANNED INTERVENTIONS: 97164- PT Re-evaluation, 97110-Therapeutic exercises, 97530- Therapeutic  activity, [id number]- Neuromuscular re-education, [phone]- Self Care, [id number]- Manual therapy, [phone]- Gait training, [phone]- Electrical stimulation (manual), Patient/Family education, Balance training, Stair training, Taping, Dry Needling, Joint mobilization, Spinal mobilization, Vestibular training, Visual/preceptual remediation/compensation, DME instructions, Cryotherapy,  and Moist heat  PLAN FOR NEXT SESSION: results of cervical MRI and how was appointment with Dr. Yetta Barre 3/20 (neurosurgeon)? How was appointment with Dr. Madelon Lips on 3/25 for his R hip? May need to add visits to POC pending visual impairments and R hip  add to HEP to address R hip weakness as tolerated, balance impairments based on FGA (head turns, pivots/turns, stepping over obstacles); convergence tasks (straw in cup, brock string), L eye occluded saccades, [place] eye tracking (all completed with standing balance challenge such as on airex)  Assess goals and plan for hold   Carmelia Bake, PT, DPT   12/07/2023, 12:20 PM

## 2023-12-10 ENCOUNTER — Encounter: Payer: 59 | Admitting: Occupational Therapy

## 2023-12-10 ENCOUNTER — Encounter

## 2023-12-12 ENCOUNTER — Encounter

## 2023-12-12 ENCOUNTER — Ambulatory Visit: Payer: 59 | Attending: Family Medicine | Admitting: Physical Therapy

## 2023-12-12 ENCOUNTER — Encounter: Payer: Self-pay | Admitting: Physical Therapy

## 2023-12-12 ENCOUNTER — Encounter: Payer: 59 | Admitting: Occupational Therapy

## 2023-12-12 VITALS — BP 139/87 | HR 65

## 2023-12-12 DIAGNOSIS — M6281 Muscle weakness (generalized): Secondary | ICD-10-CM | POA: Insufficient documentation

## 2023-12-12 DIAGNOSIS — M79605 Pain in left leg: Secondary | ICD-10-CM | POA: Diagnosis present

## 2023-12-12 DIAGNOSIS — R42 Dizziness and giddiness: Secondary | ICD-10-CM | POA: Diagnosis present

## 2023-12-12 DIAGNOSIS — R2689 Other abnormalities of gait and mobility: Secondary | ICD-10-CM | POA: Diagnosis present

## 2023-12-12 DIAGNOSIS — R2681 Unsteadiness on feet: Secondary | ICD-10-CM | POA: Insufficient documentation

## 2023-12-12 DIAGNOSIS — M25551 Pain in right hip: Secondary | ICD-10-CM | POA: Diagnosis present

## 2023-12-12 NOTE — Therapy (Signed)
 OUTPATIENT PHYSICAL THERAPY NEURO TREATMENT   Patient Name: Maurice Park MRN: [id number] DOB:Apr 26, 1956, 68 y.o., male Today's Date: 12/12/2023  PCP: Maurice Raider, MD REFERRING PROVIDER: Juliet Rude, PA-C  END OF SESSION:  PT End of Session - 12/12/23 1023     Visit Number 10    Number of Visits 13    Date for PT Re-Evaluation 01/04/24    Authorization Type [employer]    PT Start Time 1020    PT Stop Time 1055    PT Time Calculation (min) 35 min    Equipment Utilized During Treatment Gait belt    Activity Tolerance Patient tolerated treatment well   recommend med hold on hip till follow up, why session ended early   Behavior During Therapy WFL for tasks assessed/performed             Past Medical History:  Diagnosis Date   Angina    Anxiety    Back pain    CAD (coronary artery disease), with CABG [place] to LAD, Lt. radial artery to 1st diagnal, sequential SVG to PDA and PLA, 12/20/09 April 2011   Carotid artery disease (HCC)    status post right carotid endarterectomy   Chest pain    Dysrhythmia    History of heart attack    Hyperlipidemia, controlled    Hypertension    Joint pain    Lactose intolerance    Palpitations    Peripheral vascular disease (HCC)    Renal artery stenosis (HCC)    Right hip pain    Situational mixed anxiety and depressive disorder,     Sleep apnea    SOB (shortness of breath)    Past Surgical History:  Procedure Laterality Date   ABDOMINAL AORTOGRAM W/LOWER EXTREMITY N/A 03/28/2021   Procedure: ABDOMINAL AORTOGRAM W/LOWER EXTREMITY;  Surgeon: Runell Gess, MD;  Location: [facility];  Service: Cardiovascular;  Laterality: N/A;  limited runoff   ANTERIOR CRUCIATE LIGAMENT REPAIR Left    CARDIAC CATHETERIZATION  12/14/2009   multivessel CAD >> CABG (Dr. Nicki Guadalajara)   CARDIAC CATHETERIZATION  10/03/2011   L Cfx w/80% ostial stenosis, ramus with 50% segmental mid stenosis; RCA dominant & occluded in midportion; LIMA to LAD  patent; SVG to PDA/PLA patent; free L radial to diagonal functionally occluded (Dr. Erlene Quan)   CAROTID DOPPLER  11/2011   R CEA w/normal patency; left bulb w/0-49% diameter reduction   CAROTID ENDARTERECTOMY  05/03/2006   Dr. Tawanna Cooler Early   CORONARY ANGIOPLASTY  02/21/2000   PTCA with 3.25x89mm Quantum Monorail balloon of in-stent posterolateral branch restenosis, reduced from 60% to under 20% (Dr. Erlene Quan)   CORONARY ARTERY BYPASS GRAFT  12/20/2009   LIMA-LAD, left radial-1st diagonal, SVG-PDA & PLA (Dr. Kathie Rhodes. Hendrickson)   HIP ARTHROSCOPY W/ LABRAL REPAIR     LEFT HEART CATHETERIZATION WITH CORONARY/GRAFT ANGIOGRAM N/A 10/03/2011   Procedure: LEFT HEART CATHETERIZATION WITH Isabel Caprice;  Surgeon: Runell Gess, MD;  Location: [facility] CATH LAB;  Service: Cardiovascular;  Laterality: N/A;   LOWER EXTREMITY ARTERIAL DOPPLER  11/2005   normal study    NM MYOCAR IMG MI  04/2011   bruce myoview - normal perfusion; EF 58%; low risk scan   PERIPHERAL VASCULAR INTERVENTION  03/28/2021   Procedure: PERIPHERAL VASCULAR INTERVENTION;  Surgeon: Runell Gess, MD;  Location: [facility];  Service: Cardiovascular;;  Right common iliac stent   RENAL DOPPLER  12/2012   right renal stent w/1-59% diameter  reduction   TRANSTHORACIC ECHOCARDIOGRAM  04/2011   EF=>55%; trace MR; mild TR   Patient Active Problem List   Diagnosis Date Noted   Traumatic subarachnoid hemorrhage (HCC) 10/24/2023   Peripheral arterial disease (HCC) 03/11/2021   Numbness and tingling of right leg 01/27/2021   Costochondritis 11/15/2020   Metabolic syndrome 11/11/2020   Prediabetes 10/18/2020   Class 1 obesity with serious comorbidity and body mass index (BMI) of 32.0 to 32.9 in adult 10/18/2020   Essential hypertension 07/02/2018   Renal artery stenosis (HCC) 07/24/2013   Carotid artery disease (HCC) 07/24/2013   Angina at rest [facility]) 10/02/2011   CAD (coronary artery disease), with CABG [place] to LAD, Lt.  radial artery to 1st diagnal, sequential SVG to PDA and PLA, 12/20/09 10/02/2011   Hyperlipidemia, controlled 10/02/2011   Situational mixed anxiety and depressive disorder,  10/02/2011    ONSET DATE: 10/26/2023 (referral date)  REFERRING DIAG: S06.6XAA (ICD-10-CM) - Traumatic subarachnoid hemorrhage with unknown loss of consciousness status, initial encounter (HCC)  THERAPY DIAG:  Muscle weakness (generalized)  Other abnormalities of gait and mobility  Unsteadiness on feet  Pain in right hip  Pain in left leg  Dizziness and giddiness  Rationale for Evaluation and Treatment: Rehabilitation  SUBJECTIVE:                                                                                                                                                                                             SUBJECTIVE STATEMENT: "Maurice Park"  Patient reports that he is doing well. Reports that his L eye was giving him more challenges today and reports that word search went well as well. 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 Denies falls and near falls since last here. Arrives with new hurrycane to session and reports that is has been helpful for long distances. Patient reports had a conference about STD which has been challenging and needed a break after that.   Pt accompanied by: self  PERTINENT HISTORY:  PMH includes: PAD, CAD (s/p CABG in 2011), HLD, HTN, and OSA, R THA  Pt admitted to [facility] on 10/24/2023 after a ground level fall when playing tennis. Scans revealed a traumatic subarachnoid hemorrhage and an occipital fx.   PAIN:  Are you having pain? 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  DIAGNOSTIC FINDINGS:  Cervical MRI 11/08/23, pending results  Cervical Spine Xray 10/25/23 IMPRESSION: 1. No acute findings. 2. Mild retrolisthesis at C5-6 without dynamic instability.  Head CT 10/24/13 FINDINGS: Brain: Scattered subarachnoid hemorrhage along the cerebral convexities, left sylvian fissure, and inter peduncular cistern is diffusely and mildly increased. Some of this change is related to redistribution with left subarachnoid blood seen along the inferior frontal lobes. Suspect mild inferior frontal lobe hemorrhage and edema from contusion, hemorrhage on the left measuring up to 5 mm. The pattern is compatible with history of head trauma. No evidence of infarct, hydrocephalus, or mass   Vascular: No hyperdense vessel or unexpected calcification.   Skull: Vertical, nondisplaced right occipital bone fracture, known.   Sinuses/Orbits: Generalized mucosal thickening in the ethmoid, sphenoid, and frontal sinuses.   IMPRESSION: 1. Scattered subarachnoid hemorrhage with mild increase/redistribution from yesterday. 2. Possible small inferior frontal lobe contusions. 3. Known occipital bone fracture that is nondepressed.  Head CT 10/30/2023 IMPRESSION: 1. Unchanged appearance of small anterior frontal lobe hemorrhagic contusions. 2. Unchanged appearance of nondisplaced right occipital fracture.  TREATMENT:   Vitals:   12/12/23 1031  BP: 139/87  Pulse: 65    TherAct:  Goals check    Intensity: 0 = none, 1 = Lightheaded, 2 = Mild, 3 = Moderate, 4 = Severe, 5 = Vomiting Duration: < 5 s = 0, 5-10s = 1,11-30s = 2, >30s = 3 Score = Intensity + duration        Intensity when first assessed Intsity on 4/2  1. Sitting to supine 0  0  2. Supine to L side 1  0  3. Supine to R side 2.5  0  4. Supine to sitting 3  0  5. L Hallpike-Dix 0  0  6. Up from L   1  0  7. R Hallpike-Dix 3  0  8. Up from R  3  0    MSQ = Total score  (# of positions) / 20.48 MSQ = __________________  0-10 mild; 11-30 moderate; 31-100 severe   [facility] PT Assessment - 12/12/23 0001       Functional Gait  Assessment   Gait assessed  Yes    Gait Level Surface Walks 20 ft in less than 5.5 sec, no assistive devices, good speed, no evidence for imbalance, normal gait pattern, deviates no more than 6 in outside of the 12 in walkway width.    Change in Gait Speed Able to smoothly change walking speed without loss of balance or gait deviation. Deviate no more than 6 in outside of the 12 in walkway width.    Gait with Horizontal Head Turns Performs head turns smoothly with slight change in gait velocity (eg, minor disruption to smooth gait path), deviates 6-10 in outside 12 in walkway width, or uses an assistive device.   ver slight correction   Gait with Vertical Head Turns Performs head turns with no change in gait. Deviates no more than 6 in outside 12 in walkway width.    Gait and Pivot Turn Pivot turns safely within 3 sec and stops quickly with no loss of balance.    Step Over Obstacle Is able to step over 2 stacked shoe boxes taped together (9 in total height) without changing gait speed. No evidence of imbalance.    Gait with Narrow Base of Support Is able to ambulate for 10  steps heel to toe with no staggering.    Gait with Eyes Closed Walks 20 ft, no assistive devices, good speed, no evidence of imbalance, normal gait pattern, deviates no more than 6 in outside 12 in walkway width. Ambulates 20 ft in less than 7 sec.    Ambulating Backwards Walks 20 ft, uses assistive device, slower speed, mild gait deviations, deviates 6-10 in outside 12 in walkway width.   slows pace slightly   Steps Alternating feet, no rail.    Total Score 28    FGA comment: 28/30            29/30 = lows falls risk  NMR: Briefly reviewed Brock string with patient peformed 3 reps to make sure  patient understood direction per patient request; education on when to come back for therapy for hip pending on what MD states to progress balance farther    HOME EXERCISE PROGRAM: Access Code: [email or web address] URL: [email or web address] Date: 11/12/2023 Prepared by: Peter Congo  Exercises - Seated Hamstring Stretch with Strap  - 1 x daily - 7 x weekly - 1 sets - 3-5 reps - 30-60 hold - Hamstring Mobilization with Foam Roll  - 1-2 x daily - 7 x weekly - 1 sets - 10 reps - Gluteus Mobilization with Foam Roll  - 1-2 x daily - 7 x weekly - 1 sets - 10 reps - Clamshell  - 1 x daily - 7 x weekly - 3 sets - 5-10 reps - Sidelying Diagonal Hip Abduction  - 1 x daily - 7 x weekly - 3 sets - 5 reps   Keeping eyes on target on wall 5 feet away, tilt head down 15-30 and move head side to side for __30__ seconds. Repeat while moving head up and down for __30__ seconds. Do __3__ sessions per day. Oculomotor: Saccades    Holding two targets positioned side by side ___6_ inches apart, move eyes quickly from target to target as head stays still.  Perform sitting.  Do ___3_ sessions per day.  Brock String 10x - make X with single bead with breaks between reps daily [place] eye tracking 1x day with L eye occluded   GOALS: Goals reviewed with patient? Yes  SHORT TERM GOALS: Target date: 11/30/2023  Pt will be independent with initial HEP for improved strength, balance, transfers and gait and management of his pain symptoms. Baseline: Goal status: MET  2.  Pt will improve gait velocity to at least 2.75 ft/sec for improved gait efficiency and performance at SBA level  Baseline: 2.45 ft/sec no AD (2/28), 3.74 ft/sec no AD (3/20) Goal status: MET  3.  Pt will improve 5 x STS to less than or equal to 17 seconds to demonstrate improved functional strength and transfer efficiency.  Baseline: 20 sec no UE (2/28), 14 sec no UE (3/20) Goal status: MET  4.  Pt will improve FGA to 16/30 for  decreased fall risk  Baseline: 12/30 (2/28), 23/30 (3/20) Goal status: MET  5.  Vestibular system to be assessed and LTG set as appropriate Baseline:  Goal status: MET   LONG TERM GOALS: Target date: 12/21/2023   Pt will be independent with final HEP for improved strength, balance, transfers and gait and management of his pain symptoms. Baseline: reports confidence in program thus far (modify as needed pending on hip and return to work)  Goal status: IN PROGRESS  2.  Pt will improve gait velocity to at least 3.0 ft/sec for improved gait efficiency and  performance at mod I level  Baseline: 2.45 ft/sec no AD (2/28), 3.74 ft/sec no AD (3/20) Goal status: MET  3.  Pt will improve 5 x STS to less than or equal to 14 seconds to demonstrate improved functional strength and transfer efficiency.  Baseline: 20 sec no UE (2/28), 14 sec no UE (3/20) Goal status: MET  4.  Pt will improve FGA to 27/30 for decreased fall risk  Baseline: 12/30 (2/28), 23/30 (3/20), improved to 28/30 (4/2)  Goal status: REVISED/UPGRADED  5.  Pt will report </= 1/5 for all movements on MSQ to indicate improvement in motion sensitivity and improved activity tolerance.   Baseline: 3/5, improved to 0/5 for all measures Goal status: MET   ASSESSMENT:  CLINICAL IMPRESSION: Emphasis of skilled physical therapy session on 10th visit progress note and assessment on patient's progress towards goals. Patient progressing excellently towards goal with main residual deficit being hip pain. Recommend medical hold until follow up with ortho as min response to PT. Will re-cert as needed. Continue POC as able.   OBJECTIVE IMPAIRMENTS: Abnormal gait, decreased activity tolerance, decreased knowledge of condition, decreased mobility, difficulty walking, decreased ROM, decreased strength, dizziness, impaired perceived functional ability, increased muscle spasms, impaired vision/preception, and pain.   ACTIVITY LIMITATIONS:  carrying, lifting, bending, standing, stairs, transfers, and bed mobility  PARTICIPATION LIMITATIONS: meal prep, cleaning, interpersonal relationship, driving, occupation, and yard work  PERSONAL FACTORS: 3+ comorbidities:   PAD, CAD (s/p CABG in 2011), HLD, HTN, and OSA, R THAare also affecting patient's functional outcome.   REHAB POTENTIAL: Good  CLINICAL DECISION MAKING: Evolving/moderate complexity  EVALUATION COMPLEXITY: High  PLAN:  PT FREQUENCY: 1-2x/week  PT DURATION: 6 weeks  PLANNED INTERVENTIONS: 97164- PT Re-evaluation, 97110-Therapeutic exercises, 97530- Therapeutic activity, 97112- Neuromuscular re-education, 97535- Self Care, 74259- Manual therapy, [phone]- Gait training, [phone]- Electrical stimulation (manual), Patient/Family education, Balance training, Stair training, Taping, Dry Needling, Joint mobilization, Spinal mobilization, Vestibular training, Visual/preceptual remediation/compensation, DME instructions, Cryotherapy, and Moist heat  PLAN FOR NEXT SESSION: medical hold until follows up with hip; patient to call back in 30 days if significant medical change   Assess goals/re-cert with hip goal as needed pending on how it goes for patient  Carmelia Bake, PT, DPT 12/12/2023, 11:07 AM

## 2023-12-17 ENCOUNTER — Encounter: Payer: 59 | Admitting: Occupational Therapy

## 2023-12-17 ENCOUNTER — Encounter

## 2023-12-19 ENCOUNTER — Encounter

## 2023-12-19 ENCOUNTER — Encounter: Payer: 59 | Admitting: Occupational Therapy

## 2023-12-26 ENCOUNTER — Other Ambulatory Visit: Payer: Self-pay | Admitting: Cardiovascular Disease

## 2023-12-26 ENCOUNTER — Encounter: Payer: 59 | Attending: Physical Medicine & Rehabilitation | Admitting: Physical Medicine & Rehabilitation

## 2023-12-26 ENCOUNTER — Encounter: Payer: Self-pay | Admitting: Physical Medicine & Rehabilitation

## 2023-12-26 VITALS — BP 155/89 | HR 62 | Ht 64.0 in | Wt 182.2 lb

## 2023-12-26 DIAGNOSIS — S066X1D Traumatic subarachnoid hemorrhage with loss of consciousness of 30 minutes or less, subsequent encounter: Secondary | ICD-10-CM | POA: Insufficient documentation

## 2023-12-26 NOTE — Patient Instructions (Addendum)
 ALWAYS FEEL FREE TO CALL OUR OFFICE WITH ANY PROBLEMS OR QUESTIONS [phone])  **PLEASE NOTE** ALL MEDICATION REFILL REQUESTS (INCLUDING CONTROLLED SUBSTANCES) NEED TO BE MADE AT LEAST 7 DAYS PRIOR TO REFILL BEING DUE. ANY REFILL REQUESTS INSIDE THAT TIME FRAME MAY RESULT IN DELAYS IN RECEIVING YOUR PRESCRIPTION.      YOU NEED TO BUILD IN 30-60 MINUTE REST BREAKS AT WORK AFTER EVERY 4 HOURS YOU WORK.     AS FAR AS EXERCISE IS CONCERNED, YOU SHOULD PURSUE PHYSICAL ACTIVITY UP TO THE POINT WHERE YOU EXPERIENCE HEADACHE, DIZZINESS, FATIGUE, OR ANY OTHER SYMPTOMS RELATED TO YOUR BRAIN INJURY. AT THAT POINT,STOP AND REST FOR THE DAY. YOU CAN RESUME ACTIVITY THE FOLLOWING DAY FOLLOWING THE SAM PARAMETERS.

## 2023-12-26 NOTE — Progress Notes (Signed)
 Subjective:    Patient ID: Maurice Park, male    DOB: 1956-04-04, 68 y.o.   MRN: [id number]  HPI  This is an initial office visit for Maurice Park who is a 68 year old male who suffered a ground-level fall on October 24, 2023 while he was playing tennis.  Apparently fell backwards and hit the back of his head.  He apparently was unconscious for about 2 minutes.  He was brought to the emergency room and a CT was performed showing a 4 mm subarachnoid/subdural hemorrhage in the bi-frontal convexities and right occipital bone fracture.  Patient was hypertensive and placed on the Cardene drip.  Patient reported headache after the fall.  Neurosurgery was consulted recommended a follow-up head CT which was done the next day and was stable.  Cervical spine films were cleared.  Patient progressed from a physical standpoint and was discharged to home on 10/27/2023.  Currently the patient is not taking anything for pain.  He had been on oxycodone and Fioricet previously as well as gabapentin.  He has resumed work full time on Monday. 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 He's on conference calls a lot and is on the computer frequently sometime for 12-14 hours on a given day.  He is the leader of the team.    From a standpoint of sleep, he's trying to get 8-10 hours per night. He sometimes achieves this and other days he doesn't.  He typically feels rested when he wakes up.   Emotionally the patient reports he's doing fairly well after struggling a bit initially in the first week or 2 after the injury. His wife has been supportive.    Cognitively, he feels that he's close to baseline for his cognition. He worked a 11 hour day yesterday.  However, he does feel more fatigued compared to before.    In regard to social activity/reintegration he's doing fine. He gets along with his co-workers without issues.    From a  standpoint of pain, he hasn't had a headache for about 3 to 3.5 weeks.  He had some muscle spasms early in both his lower extremities.  They appear to have resolved after about 10 days.. He's also having right hip pain which has increased as a result of the fall. The pain is along the outside of his hip.  He is followed by orthopedic surgery for the hip.  He has had an injection to the greater trochanter already.  He reports occasionally ringing in his left ear which remains. He has had popping in his right which stopped last week.   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   He suffered from vertigo initially.  He went through vestibular rehab at [facility].  His symptoms have resolved.  He is no longer doing any type of maintenance treatments at home. .  Pain Inventory Average Pain 8 Pain Right Now 0 My pain is intermittent, burning, aching, and with walking--is having mri next week [name]  LOCATION OF PAIN  hip - part of his fall injury  BOWEL Number of stools per week: 7  BLADDER Difficulty starting stream Yes   Mobility use a cane how many minutes can you walk? Not many with hip pain ability to climb steps?  yes do you drive?  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  yes  is having MRI for hip pain being seen by [name]  Physicians involved in your care Any changes since last visit?  no   Family History  Problem Relation Age of Onset   Hypertension Mother 16   Diabetes Mother    Hyperlipidemia Mother    Coronary artery disease Father 69   Heart attack Father 75   Hypertension Father    Heart disease Father    Sudden death Father    Heart disease Paternal Grandmother 65   Stroke Paternal Grandmother 42   Heart attack  Paternal Grandfather 50   Hyperlipidemia Brother    Social History   Socioeconomic History   Marital status: Media planner    Spouse name: Not on file   Number of children: 1   Years of education: BSBA   Highest education level: Not on file  Occupational History   Occupation: Sr Nutritional therapist  Tobacco Use   Smoking status: Former    Current packs/day: 0.00    Average packs/day: 1 pack/day for 12.0 years (12.0 ttl pk-yrs)    Types: Cigarettes    Start date: 11/09/1969    Quit date: 11/09/1981    Years since quitting: 42.1   Smokeless tobacco: Never  Substance and Sexual Activity   Alcohol use: Yes    Alcohol/week: 3.0 standard drinks of alcohol    Types: 3 Cans of beer per week    Comment: last drink was in november   Drug use: No   Sexual activity: Not Currently    Comment: Right Carotid endarectomy  Other Topics Concern   Not on file  Social History Narrative   Not on file   Social Drivers of Health   Financial Resource Strain: Not on file  Food Insecurity: No Food Insecurity (10/26/2023)   Hunger Vital Sign    Worried About Running Out of Food in the Last Year: Never true    Ran Out of Food in the Last Year: Never true  Transportation Needs: No Transportation Needs (10/26/2023)   PRAPARE - Administrator, Civil Service (Medical): No    Lack of Transportation (Non-Medical): No  Physical Activity: Not on file  Stress: Not on file  Social Connections: Socially Integrated (10/26/2023)   Social Connection and Isolation Panel [NHANES]    Frequency of Communication with Friends and Family: More than three times a week    Frequency of Social Gatherings with Friends and Family: More than three times a week    Attends Religious Services: More than 4 times per year    Active Member of Clubs or Organizations: Yes    Attends Banker Meetings: More than 4 times per year    Marital Status: Married   Past Surgical History:  Procedure Laterality Date    ABDOMINAL AORTOGRAM W/LOWER EXTREMITY N/A 03/28/2021   Procedure: ABDOMINAL AORTOGRAM W/LOWER EXTREMITY;  Surgeon: Avanell Leigh, MD;  Location: [facility] INVASIVE CV LAB;  Service: Cardiovascular;  Laterality: N/A;  limited runoff   ANTERIOR CRUCIATE LIGAMENT REPAIR Left    CARDIAC CATHETERIZATION  12/14/2009   multivessel CAD >> CABG (Dr. Magnus Schuller)   CARDIAC CATHETERIZATION  10/03/2011   L Cfx w/80% ostial stenosis, ramus with 50% segmental mid stenosis; RCA dominant & occluded in midportion; LIMA  to LAD patent; SVG to PDA/PLA patent; free L radial to diagonal functionally occluded (Dr. Aleda [name])   CAROTID DOPPLER  11/2011   R CEA w/normal patency; left bulb w/0-49% diameter reduction   CAROTID ENDARTERECTOMY  05/03/2006   Dr. Ena Harries Early   CORONARY ANGIOPLASTY  02/21/2000   PTCA with 3.25x79mm Quantum Monorail balloon of in-stent posterolateral branch restenosis, reduced from 60% to under 20% (Dr. Aleda [name])   CORONARY ARTERY BYPASS GRAFT  12/20/2009   LIMA-LAD, left radial-1st diagonal, SVG-PDA & PLA (Dr. Laneta Pintos. Hendrickson)   HIP ARTHROSCOPY W/ LABRAL REPAIR     LEFT HEART CATHETERIZATION WITH CORONARY/GRAFT ANGIOGRAM N/A 10/03/2011   Procedure: LEFT HEART CATHETERIZATION WITH Estella Helling;  Surgeon: Avanell Leigh, MD;  Location: [facility] CATH LAB;  Service: Cardiovascular;  Laterality: N/A;   LOWER EXTREMITY ARTERIAL DOPPLER  11/2005   normal study    NM MYOCAR IMG MI  04/2011   bruce myoview - normal perfusion; EF 58%; low risk scan   PERIPHERAL VASCULAR INTERVENTION  03/28/2021   Procedure: PERIPHERAL VASCULAR INTERVENTION;  Surgeon: Avanell Leigh, MD;  Location: [facility];  Service: Cardiovascular;;  Right common iliac stent   RENAL DOPPLER  12/2012   right renal stent w/1-59% diameter reduction   TRANSTHORACIC ECHOCARDIOGRAM  04/2011   EF=>55%; trace MR; mild TR   Past Medical History:  Diagnosis Date   Angina    Anxiety    Back pain    CAD (coronary  artery disease), with CABG [place] to LAD, Lt. radial artery to 1st diagnal, sequential SVG to PDA and PLA, 12/20/09 April 2011   Carotid artery disease [facility])    status post right carotid endarterectomy   Chest pain    Dysrhythmia    History of heart attack    Hyperlipidemia, controlled    Hypertension    Joint pain    Lactose intolerance    Palpitations    Peripheral vascular disease (HCC)    Renal artery stenosis (HCC)    Right hip pain    Situational mixed anxiety and depressive disorder,     Sleep apnea    SOB (shortness of breath)    BP (!) 155/89   Pulse 62   Ht 5\' 4"  (1.626 m)   Wt 182 lb 3.2 oz (82.6 kg)   SpO2 99%   BMI 31.27 kg/m   Opioid Risk Score:   Fall Risk Score:  `1  Depression screen [facility] 2/9     09/16/2020   10:23 AM 07/04/2017    4:16 PM  Depression screen PHQ 2/9  Decreased Interest 1 0  Down, Depressed, Hopeless 1 0  PHQ - 2 Score 2 0  Altered sleeping 0   Tired, decreased energy 3   Change in appetite 1   Feeling bad or failure about yourself  0   Trouble concentrating 0   Moving slowly or fidgety/restless 0   Suicidal thoughts 0   PHQ-9 Score 6   Difficult doing work/chores Not difficult at all      Review of Systems  HENT:         Loss of taste and smell  Respiratory:  Positive for apnea.   Musculoskeletal:  Positive for gait problem.       Hip pain after fall injury  All other systems reviewed and are negative.      Objective:   Physical Exam Gen: no distress, normal appearing HEENT: oral mucosa pink and moist, NCAT Cardio: Reg  rate Chest: normal effort, normal rate of breathing Abd: soft, non-distended Ext: no edema Psych: pleasant, normal affect Skin: intact Neuro: Alert and oriented x 3. Normal insight and awareness. Intact Memory. Normal language and speech.  I did not put him through any formal cognitive testing as he appears to be at baseline and is already returned to work functioning at a high level through his job.   Cranial nerve exam unremarkable. MMT: Upper extremities are grossly 5 out of 5 proximal and distal.  Right lower extremity is a bit inhibited with hip flexion and extension due to pain but otherwise is 4+ to 5 out of 5.  Left lower extremity is 4+ to 5 out of 5.Sensory exam normal for light touch and pain in all 4 limbs. No limb ataxia or cerebellar signs. No abnormal tone appreciated.  Tandem gait was normal.  He was able to walk heel-to-toe without much difficulty either.  Romberg test was negative..   Musculoskeletal: has lateral hip pain with palpation but that no pain with figure-of-four maneuver or cross leg maneuver.       Assessment & Plan:  Hx of bi-frontal SDH/SAH on 10/24/23 after ground level fall.  He is made excellent progress!  He has returned to work full-time but probably is doing a bit too much at present.  His main issues are fatigue as well as higher-level balance issues and loss of smell.  Vestibular problems and headaches seem to have resolved. Acute on chronic right hip pain.  May be related to his greater trochanter on the right.   Plan: He is doing extremely well at this point.  Advised him not to go overboard with his job responsibilities.  He needs to set up scheduled work breaks after each 4 hours of work. Ie, if he works more than 8 hours he needs to take another break as well between hour 8 and the following hours.  Additionally if he feels that he is struggling from a visual, cognitive or stress standpoint, he need to take a break as needed.  I think if he sets up these guidelines at work for himself and those around him, he should be fine.  I would expect that he will need to continue with this approach at least for the next few months, perhaps even a bit longer in fact. From a physical since he is doing very well also.  He only has higher-level balance issues.  He would like to get back to his boat and has talked about this with his neurosurgeon.  He has been advised to  avoid that for the time being.  I'll let him and Dr. Yetta Barre work that out at his next visit.  Otherwise physically, he should exercise to his tolerance.  If he feels any dizziness or lightheadedness or even headache, he should stop and revisit the exercise a next day.  Probably more anything, his hip will prevent him from pursuing aggressive activity.  He follows up with orthopedics for that. From a standpoint of his smell this may take several months or longer to return. We discussed how the olfactory nerve is the most common cranial nerve injury in traumatic brain injury.  Also, given the mechanism of his injury. 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 He no longer has any vestibular symptoms.  He completed vestibular rehab at [facility] neurorehab.  Advised him to resume any home exercises if he experiences any recurrent symptoms. Otherwise he is doing quite well.  I really do not have anything more to offer.  I can see him back as needed here in the future.  He is advised to call me with any problems or questions.  Forty-five minutes of face to face patient care time were spent during this visit. All questions were encouraged and answered. Written instructions and recommendations were provided. Follow up with me as needed.

## 2024-01-08 ENCOUNTER — Ambulatory Visit: Payer: 59 | Attending: Cardiovascular Disease | Admitting: Cardiovascular Disease

## 2024-01-08 ENCOUNTER — Encounter: Payer: Self-pay | Admitting: Cardiovascular Disease

## 2024-01-08 VITALS — BP 146/82 | HR 60 | Ht 64.0 in | Wt 180.6 lb

## 2024-01-08 DIAGNOSIS — I10 Essential (primary) hypertension: Secondary | ICD-10-CM

## 2024-01-08 DIAGNOSIS — I739 Peripheral vascular disease, unspecified: Secondary | ICD-10-CM | POA: Diagnosis not present

## 2024-01-08 DIAGNOSIS — I2510 Atherosclerotic heart disease of native coronary artery without angina pectoris: Secondary | ICD-10-CM

## 2024-01-08 DIAGNOSIS — E782 Mixed hyperlipidemia: Secondary | ICD-10-CM

## 2024-01-08 DIAGNOSIS — I701 Atherosclerosis of renal artery: Secondary | ICD-10-CM | POA: Diagnosis not present

## 2024-01-08 DIAGNOSIS — I6521 Occlusion and stenosis of right carotid artery: Secondary | ICD-10-CM

## 2024-01-08 MED ORDER — NITROGLYCERIN 0.4 MG SL SUBL: 0.4000 mg | SUBLINGUAL_TABLET | SUBLINGUAL | 3 refills | Status: AC | PRN

## 2024-01-08 NOTE — Assessment & Plan Note (Signed)
 History of PAD status post orbital atherectomy and VBX covered stenting of a high-grade calcified proximal right common iliac artery stenosis 03/28/2021 with follow-up Dopplers performed that show a continued patent stent.  He denies claudication.  Will continue to monitor this.

## 2024-01-08 NOTE — Assessment & Plan Note (Signed)
 History of carotid artery disease status post elective right carotid endarterectomy by Dr. Shirley Douglas in 2007 which we have been following by duplex ultrasound on annual basis most recently performed 01/25/2023 revealing a patent endarterectomy site.

## 2024-01-08 NOTE — Assessment & Plan Note (Signed)
 History of CAD status post posterolateral branch stenting by myself 02/21/2000.  He had moderate LAD and circumflex disease at that time.  He was recatheterized in 2011 revealing 60% "in-stent restenosis within the PLA stent which I redilated.  He ultimately had catheterization by Dr. Loetta Ringer 12/24/2009 revealing left main/three-vessel disease and underwent coronary artery bypass grafting x 4 December 20, 2009 for LIMA to LAD, left radial to the diagonal branch and sequential vein to the PDA and PLA branch.  He was recatheterized 10/02/2010 revealing an occluded left radial graft, patent LIMA to the LAD and sequential vein to the PDA PLA.  He has remained stable since that time denying chest pain or shortness of breath.

## 2024-01-08 NOTE — Patient Instructions (Addendum)
 Medication Instructions:  Your physician recommends that you continue on your current medications as directed. Please refer to the Current Medication list given to you today.  *If you need a refill on your cardiac medications before your next appointment, please call your pharmacy*   Testing/Procedures: Your physician has requested that you have a carotid duplex. This test is an ultrasound of the carotid arteries in your neck. It looks at blood flow through these arteries that supply the brain with blood. Allow one hour for this exam. There are no restrictions or special instructions. **To be done in May**  Please note: We ask at that you not bring children with you during ultrasound (echo/ vascular) testing. Due to room size and safety concerns, children are not allowed in the ultrasound rooms during exams. Our front office staff cannot provide observation of children in our lobby area while testing is being conducted. An adult accompanying a patient to their appointment will only be allowed in the ultrasound room at the discretion of the ultrasound technician under special circumstances. We apologize for any inconvenience.  Your physician has requested that you have a renal artery duplex. During this test, an ultrasound is used to evaluate blood flow to the kidneys. Take your medications as you usually do. **To be done in May**  No food after 11PM the night before.  Water is OK. (Don't drink liquids if you have been instructed not to for ANOTHER test). Avoid foods that produce bowel gas, for 24 hours prior to exam (see below). No breakfast, no chewing gum, no smoking or carbonated beverages. Patient may take morning medications with water. Come in for test at least 15 minutes early to register.  Please note: We ask at that you not bring children with you during ultrasound (echo/ vascular) testing. Due to room size and safety concerns, children are not allowed in the ultrasound rooms during  exams. Our front office staff cannot provide observation of children in our lobby area while testing is being conducted. An adult accompanying a patient to their appointment will only be allowed in the ultrasound room at the discretion of the ultrasound technician under special circumstances. We apologize for any inconvenience.  Your physician has requested that you have an Aorta/Iliac Duplex. **To be done in May**  No food after 11PM the night before.  Water is OK. (Don't drink liquids if you have been instructed not to for ANOTHER test) Avoid foods that produce bowel gas, for 24 hours prior to exam (see below). No breakfast, no chewing gum, no smoking or carbonated beverages. Patient may take morning medications with water. Come in for test at least 15 minutes early to register.  Please note: We ask at that you not bring children with you during ultrasound (echo/ vascular) testing. Due to room size and safety concerns, children are not allowed in the ultrasound rooms during exams. Our front office staff cannot provide observation of children in our lobby area while testing is being conducted. An adult accompanying a patient to their appointment will only be allowed in the ultrasound room at the discretion of the ultrasound technician under special circumstances. We apologize for any inconvenience.  Your physician has requested that you have an ankle brachial index (ABI). During this test an ultrasound and blood pressure cuff are used to evaluate the arteries that supply the arms and legs with blood. Allow thirty minutes for this exam. There are no restrictions or special instructions. **To be done in May**   Please note:  We ask at that you not bring children with you during ultrasound (echo/ vascular) testing. Due to room size and safety concerns, children are not allowed in the ultrasound rooms during exams. Our front office staff cannot provide observation of children in our lobby area while  testing is being conducted. An adult accompanying a patient to their appointment will only be allowed in the ultrasound room at the discretion of the ultrasound technician under special circumstances. We apologize for any inconvenience.   Follow-Up: At [facility], you and your health needs are our priority.  As part of our continuing mission to provide you with exceptional heart care, our providers are all part of one team.  This team includes your primary Cardiologist (physician) and Advanced Practice Providers or APPs (Physician Assistants and Nurse Practitioners) who all work together to provide you with the care you need, when you need it.  Your next appointment:   12 month(s)  Provider:   Lauro Portal, MD    We recommend signing up for the patient portal called "MyChart".  Sign up information is provided on this After Visit Summary.  MyChart is used to connect with patients for Virtual Visits (Telemedicine).  Patients are able to view lab/test results, encounter notes, upcoming appointments, etc.  Non-urgent messages can be sent to your provider as well.   To learn more about what you can do with MyChart, go to [email or web address].

## 2024-01-08 NOTE — Assessment & Plan Note (Signed)
 History of renal artery stenosis status post right renal artery stenting in the past by myself with renal Doppler studies performed 01/25/2023 revealing a patent renal artery stent.  This will be followed on an annual basis

## 2024-01-08 NOTE — Assessment & Plan Note (Signed)
 History of essential hypertension with blood pressure measured today at 146/82.  He is on metoprolol , hydralazine  and ramipril .

## 2024-01-08 NOTE — Progress Notes (Signed)
 01/08/2024 Maurice Park   1956/06/12  [id number]  Primary Physician Glena Landau, MD Primary Cardiologist: Avanell Leigh MD Maurice Park, [place], [place]  HPI:  Maurice Park is a 68 y.o.   widowed (wife died from ALS Mar 02, 2012) [place] male, father of 1 son Maurice Park) who I last saw in the office 01/04/23.Aaron Aas   His son graduated from [place] and moved to [place]. [place]  where he plans to study prosthetics. He remarried to Maurice Park, who works as a Public librarian at a Retail [employer].  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  He has a history of CAD and PVOD. I stented his posterolateral branch back February 21, 2000. He had moderate LAD and circumflex disease at that time. He was recatheterization in 2011 revealing 60% "in-stent restenosis" of his PLA stent which I redilated. He also had right renal artery stenosis which I stented as well. Dr. Ena Harries Early performed elective right carotid endarterectomy on him in 2007 which we follow by duplex ultrasound. He developed crescendo angina and was catheterized by Dr. Terrace Ferrara December 24, 2009, revealing left main 3-vessel disease. He underwent coronary artery bypass grafting x4 December 20, 2009, with a LIMA to his LAD, a left radial to a diagonal branch, sequential vein to the PDA and PLA system. His postop course was complicated by prolonged paroxysmal atrial fibrillation which he recuperated from nicely. <BR><BR>He had a Myoview stress test performed March 22, 2010, which was nonischemic. Renal Dopplers continue to show widely patent right renal artery stent and carotid Dopplers show patent endarterectomy site. He denies chest pain or shortness of breath. He was catheterized October 02, 2010, revealing an occluded left radial graft to a diagonal branch, patent LIMA to the LAD and a patent sequential vein to the PDA and PLA with normal LV function and patent right renal artery stent. His last lipid profile was a  year ago. Since I saw him 18 months ago he's remained completely stable. He is working out with a Psychologist, educational 2 days a week. His son Maurice Park is now 38 years old and has gotten into [employer]his wife of 25 years, Maurice Park, died 09-[name]-2013of ALS. He remarried to his current wife Maurice Park on 01/29/16.       He is very active and plays tennis for long periods of time without symptoms.  His lipid profile and hemoglobin A1c are in the therapeutic range.  Recent carotid and renal Dopplers were normal.  He does complain of some right calf claudication however which is new over the last 6 to 8 months.  I obtain lower extremity arterial Dopplers on him 02/17/2021 revealed a right ABI of 0.92 and a left of 1.18.  He did have a high-frequency signal in his right iliac artery suggesting high-grade disease probably contributing to his lifestyle of any claudication.  I performed peripheral angiography, orbital atherectomy and VBX covered stenting of a high-grade calcified proximal right common iliac artery stenosis with an excellent result.  He was discharged home the following day.  His Dopplers performed 04/04/2021 showed normalization of an 8 of his ABI and his velocities.  His hip pain and claudication have resolved.  He is back playing tennis without limitation.   Since I saw him a year ago he apparently fell while playing tennis 10/24/2023 and hit his head.  He did have a injury cranial bleed and was  followed by Dr. Waymond Hailey.  He was out of work for a month and has just gone back to work recently.  He did have some visual impairment initially.  He denies chest pain, shortness of breath or claudication.   Current Meds  Medication Sig   aspirin  EC 81 MG tablet Take 81 mg by mouth at bedtime.   ezetimibe -simvastatin  (VYTORIN ) 10-20 MG tablet TAKE 1 TABLET BY MOUTH EVERY DAY   fluticasone  (FLONASE ) 50 MCG/ACT nasal spray Place 2 sprays into both nostrils daily.   hydrALAZINE  (APRESOLINE ) 25 MG tablet Take 1  tablet (25 mg total) by mouth in the morning and at bedtime.   meloxicam (MOBIC) 15 MG tablet Take 15 mg by mouth daily.   metoprolol  succinate (TOPROL -XL) 50 MG 24 hr tablet TAKE 1 TABLET BY MOUTH EVERY DAY WITH OR IMMEDIATELY FOLLOWING A MEAL   Multiple Vitamin (MULTIVITAMIN) capsule Take 1 capsule by mouth daily.   ramipril  (ALTACE ) 5 MG capsule TAKE 1 CAPSULE BY MOUTH EVERY DAY (Patient taking differently: Take 5 mg by mouth daily.)   ranolazine  (RANEXA ) 500 MG 12 hr tablet TAKE 1 TABLET BY MOUTH TWICE A DAY   sildenafil  (VIAGRA ) 100 MG tablet Take 1 tablet (100 mg total) by mouth daily as needed for erectile dysfunction.   [DISCONTINUED] nitroGLYCERIN  (NITROSTAT ) 0.4 MG SL tablet Place 1 tablet (0.4 mg total) under the tongue every 5 (five) minutes as needed for up to 25 days for chest pain.     Allergies  Allergen Reactions   Hydrocortisone Hives   Other Swelling    Any topical bacitracin cream   Codeine Nausea Only   Contrast Media [Iodinated Contrast Media] Hives   Neosporin [Neomycin-Polymyxin-Gramicidin] Swelling    Social History   Socioeconomic History   Marital status: Media planner    Spouse name: Not on file   Number of children: 1   Years of education: BSBA   Highest education level: Not on file  Occupational History   Occupation: Sr Nutritional therapist  Tobacco Use   Smoking status: Former    Current packs/day: 0.00    Average packs/day: 1 pack/day for 12.0 years (12.0 ttl pk-yrs)    Types: Cigarettes    Start date: 11/09/1969    Quit date: 11/09/1981    Years since quitting: 42.1   Smokeless tobacco: Never  Substance and Sexual Activity   Alcohol use: Yes    Alcohol/week: 3.0 standard drinks of alcohol    Types: 3 Cans of beer per week    Comment: last drink was in november   Drug use: No   Sexual activity: Not Currently    Comment: Right Carotid endarectomy  Other Topics Concern   Not on file  Social History Narrative   Not on file   Social Drivers  of Health   Financial Resource Strain: Not on file  Food Insecurity: No Food Insecurity (10/26/2023)   Hunger Vital Sign    Worried About Running Out of Food in the Last Year: Never true    Ran Out of Food in the Last Year: Never true  Transportation Needs: No Transportation Needs (10/26/2023)   PRAPARE - Administrator, Civil Service (Medical): No    Lack of Transportation (Non-Medical): No  Physical Activity: Not on file  Stress: Not on file  Social Connections: Socially Integrated (10/26/2023)   Social Connection and Isolation Panel [NHANES]    Frequency of Communication with Friends and Family: More than three times a week  Frequency of Social Gatherings with Friends and Family: More than three times a week    Attends Religious Services: More than 4 times per year    Active Member of [employer] or Organizations: Yes    Attends Engineer, structural: More than 4 times per year    Marital Status: Married  Catering manager Violence: Not At Risk (10/26/2023)   Humiliation, Afraid, Rape, and Kick questionnaire    Fear of Current or Ex-Partner: No    Emotionally Abused: No    Physically Abused: No    Sexually Abused: No     Review of Systems: General: negative for chills, fever, night sweats or weight changes.  Cardiovascular: negative for chest pain, dyspnea on exertion, edema, orthopnea, palpitations, paroxysmal nocturnal dyspnea or shortness of breath Dermatological: negative for rash Respiratory: negative for cough or wheezing Urologic: negative for hematuria Abdominal: negative for nausea, vomiting, diarrhea, bright red blood per rectum, melena, or hematemesis Neurologic: negative for visual changes, syncope, or dizziness All other systems reviewed and are otherwise negative except as noted above.    Blood pressure (!) 146/82, pulse 60, height 5\' 4"  (1.626 m), weight 180 lb 9.6 oz (81.9 kg), SpO2 96%.  General appearance: alert and no distress Neck: no  adenopathy, no carotid bruit, no JVD, supple, symmetrical, trachea midline, and thyroid not enlarged, symmetric, no tenderness/mass/nodules Lungs: clear to auscultation bilaterally Heart: regular rate and rhythm, S1, S2 normal, no murmur, click, rub or gallop Extremities: extremities normal, atraumatic, no cyanosis or edema Pulses: 2+ and symmetric Skin: Skin color, texture, turgor normal. No rashes or lesions Neurologic: Grossly normal  EKG not performed today      ASSESSMENT AND PLAN:   CAD (coronary artery disease), with CABG [place] to LAD, Lt. radial artery to 1st diagnal, sequential SVG to PDA and PLA, 12/20/09 History of CAD status post posterolateral branch stenting by myself 02/21/2000.  He had moderate LAD and circumflex disease at that time.  He was recatheterized in 2011 revealing 60% "in-stent restenosis within the PLA stent which I redilated.  He ultimately had catheterization by Dr. Loetta Ringer 12/24/2009 revealing left main/three-vessel disease and underwent coronary artery bypass grafting x 4 December 20, 2009 for LIMA to LAD, left radial to the diagonal branch and sequential vein to the PDA and PLA branch.  He was recatheterized 10/02/2010 revealing an occluded left radial graft, patent LIMA to the LAD and sequential vein to the PDA PLA.  He has remained stable since that time denying chest pain or shortness of breath.  Hyperlipidemia, controlled History of hyperlipidemia on Vytorin  with lipid profile performed 09/03/2023 revealing total cholesterol 140, LDL 83 and HDL of 47.  He does admit to dietary indiscretion and has promised to modify his diet.  Renal artery stenosis (HCC) History of renal artery stenosis status post right renal artery stenting in the past by myself with renal Doppler studies performed 01/25/2023 revealing a patent renal artery stent.  This will be followed on an annual basis  Carotid artery disease (HCC) History of carotid artery disease status post elective right  carotid endarterectomy by Dr. Shirley Douglas in 2007 which we have been following by duplex ultrasound on annual basis most recently performed 01/25/2023 revealing a patent endarterectomy site.  Essential hypertension History of essential hypertension with blood pressure measured today at 146/82.  He is on metoprolol , hydralazine  and ramipril .  Peripheral arterial disease (HCC) History of PAD status post orbital atherectomy and VBX covered stenting of a high-grade calcified proximal right common  iliac artery stenosis 03/28/2021 with follow-up Dopplers performed that show a continued patent stent.  He denies claudication.  Will continue to monitor this.     Avanell Leigh MD FACP,FACC,FAHA, [facility] 01/08/2024 8:35 AM

## 2024-01-08 NOTE — Assessment & Plan Note (Signed)
 History of hyperlipidemia on Vytorin  with lipid profile performed 09/03/2023 revealing total cholesterol 140, LDL 83 and HDL of 47.  He does admit to dietary indiscretion and has promised to modify his diet.

## 2024-01-23 ENCOUNTER — Other Ambulatory Visit: Payer: Self-pay | Admitting: Cardiovascular Disease

## 2024-02-07 ENCOUNTER — Ambulatory Visit: Payer: Self-pay | Admitting: Cardiology

## 2024-02-07 ENCOUNTER — Ambulatory Visit (HOSPITAL_COMMUNITY)
Admission: RE | Admit: 2024-02-07 | Discharge: 2024-02-07 | Disposition: A | Discharge status: Home / Self Care | Source: Ambulatory Visit | Attending: Cardiovascular Disease | Admitting: Cardiovascular Disease

## 2024-02-07 ENCOUNTER — Ambulatory Visit (HOSPITAL_COMMUNITY)
Admission: RE | Admit: 2024-02-07 | Discharge: 2024-02-07 | Discharge status: Home / Self Care | Source: Ambulatory Visit | Attending: Cardiovascular Disease | Admitting: Cardiovascular Disease

## 2024-02-07 DIAGNOSIS — E782 Mixed hyperlipidemia: Secondary | ICD-10-CM

## 2024-02-07 DIAGNOSIS — I701 Atherosclerosis of renal artery: Secondary | ICD-10-CM

## 2024-02-07 DIAGNOSIS — I6521 Occlusion and stenosis of right carotid artery: Secondary | ICD-10-CM | POA: Diagnosis not present

## 2024-02-07 DIAGNOSIS — I739 Peripheral vascular disease, unspecified: Secondary | ICD-10-CM

## 2024-02-07 DIAGNOSIS — Z95828 Presence of other vascular implants and grafts: Secondary | ICD-10-CM

## 2024-02-07 LAB — VAS US ABI WITH/WO TBI
Left ABI: 1.04
Right ABI: 0.66

## 2024-02-07 NOTE — Progress Notes (Signed)
 Widely patent carotid endarterectomy, no significant carotid artery disease, no further evaluation is indicated.

## 2024-02-07 NOTE — Progress Notes (Signed)
 Study suggest probable restenosis in the angioplasty site, probably before the stent or after the stent, may need repeat study with angiography in view of decreased ABI and elevated velocity across the iliac stent.  Please schedule an appointment with Dr. Katheryne Pane.

## 2024-02-07 NOTE — Progress Notes (Signed)
 Right renal artery stent appears widely patent

## 2024-02-27 ENCOUNTER — Other Ambulatory Visit: Payer: Self-pay | Admitting: Cardiovascular Disease

## 2024-02-27 NOTE — Telephone Encounter (Signed)
Pt is calling back to speak to nurse

## 2024-03-25 ENCOUNTER — Encounter: Payer: Self-pay | Admitting: Cardiovascular Disease

## 2024-03-25 ENCOUNTER — Ambulatory Visit: Attending: Cardiovascular Disease | Admitting: Cardiovascular Disease

## 2024-03-25 VITALS — BP 164/90 | HR 66 | Ht 64.5 in | Wt 188.6 lb

## 2024-03-25 DIAGNOSIS — I739 Peripheral vascular disease, unspecified: Secondary | ICD-10-CM | POA: Diagnosis not present

## 2024-03-25 MED ORDER — PREDNISONE 50 MG PO TABS: ORAL_TABLET | ORAL | 0 refills | Status: DC

## 2024-03-25 NOTE — Progress Notes (Signed)
 03/25/2024 Maurice Park   [birth year]  [id number]  Primary Physician Loreli Kins, MD Primary Cardiologist: [name], [place], [place]  HPI:  Maurice Park is a 68 y.o.   widowed (wife died from ALS 26-Mar-2012) [place] male, father of 1 son [name]) who I last saw in the office 01/08/2024.Maurice Park   His son graduated from [place] and moved to [place]. [place]  where he plans to study prosthetics. He remarried to Maurice Park, who works as a Public librarian at a [employer] buyer.  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  He has a history of CAD and PVOD. I stented his posterolateral branch back February 21, 2000. He had moderate LAD and circumflex disease at that time. He was recatheterization in 2011 revealing 60% in-stent restenosis of his PLA stent which I redilated. He also had right renal artery stenosis which I stented as well. Dr. Krystal Early performed elective right carotid endarterectomy on him in 2007 which we follow by duplex ultrasound. He developed crescendo angina and was catheterized by Dr. [name] December 24, 2009, revealing left main 3-vessel disease. He underwent coronary artery bypass grafting x4 December 20, 2009, with a LIMA to his LAD, a left radial to a diagonal branch, sequential vein to the PDA and PLA system. His postop course was complicated by prolonged paroxysmal atrial fibrillation which he recuperated from nicely. <BR><BR>He had a Myoview stress test performed March 22, 2010, which was nonischemic. Renal Dopplers continue to show widely patent right renal artery stent and carotid Dopplers show patent endarterectomy site. He denies chest pain or shortness of breath. He was catheterized October 02, 2010, revealing an occluded left radial graft to a diagonal branch, patent LIMA to the LAD and a patent sequential vein to the PDA and PLA with normal LV function and patent right renal artery stent. His last lipid profile was a  year ago. Since I saw him 18 months ago he's remained completely stable. He is working out with a Psychologist, educational 2 days a week. His son Maurice Park is now 62 years old and has gotten into [employer]his wife of 25 years, Maurice Park, died 07/19/13of ALS. He remarried to his current wife Maurice Park on 01/29/16.       He is very active and plays tennis for long periods of time without symptoms.  His lipid profile and hemoglobin A1c are in the therapeutic range.  Recent carotid and renal Dopplers were normal.  He does complain of some right calf claudication however which is new over the last 6 to 8 months.  I obtain lower extremity arterial Dopplers on him 02/17/2021 revealed a right ABI of 0.92 and a left of 1.18.  He did have a high-frequency signal in his right iliac artery suggesting high-grade disease probably contributing to his lifestyle of any claudication.  I performed peripheral angiography, orbital atherectomy and VBX covered stenting of a high-grade calcified proximal right common iliac artery stenosis with an excellent result.  He was discharged home the following day.  His Dopplers performed 04/04/2021 showed normalization of an 8 of his ABI and his velocities.  His hip pain and claudication have resolved.  He is back playing tennis without limitation.   He apparently fell while playing tennis 10/24/2023 and hit his head.  He did have a injury cranial bleed and was followed by [name].  He  was out of work for a month and has just gone back to work recently.  He did have some visual impairment initially.  Since I saw him 2 months ago he has been complaining of right lower extremity claudication.  He is undergoing physical therapy and lithotripsy therapy with potential tear of some ligaments although his most recent Doppler studies reveal a decline in his right ABI from 1.11 down to 0.66 suggesting either restenosis or Denovo lesion in the right iliac system.  He wishes to proceed with outpatient  angiography and endovascular therapy for lifestyle-limiting claudication.  Current Meds  Medication Sig   aspirin  EC 81 MG tablet Take 81 mg by mouth at bedtime.   ezetimibe -simvastatin  (VYTORIN ) 10-20 MG tablet TAKE 1 TABLET BY MOUTH EVERY DAY   fluticasone  (FLONASE ) 50 MCG/ACT nasal spray Place 2 sprays into both nostrils daily.   gabapentin (NEURONTIN) 100 MG capsule Take 100 mg by mouth as needed.   hydrALAZINE  (APRESOLINE ) 25 MG tablet Take 1 tablet (25 mg total) by mouth in the morning and at bedtime.   meloxicam (MOBIC) 15 MG tablet Take 15 mg by mouth daily.   metoprolol  succinate (TOPROL -XL) 50 MG 24 hr tablet TAKE 1 TABLET BY MOUTH EVERY DAY WITH OR IMMEDIATELY FOLLOWING A MEAL   Multiple Vitamin (MULTIVITAMIN) capsule Take 1 capsule by mouth daily.   ramipril  (ALTACE ) 5 MG capsule TAKE 1 CAPSULE BY MOUTH EVERY DAY   ranolazine  (RANEXA ) 500 MG 12 hr tablet TAKE 1 TABLET BY MOUTH TWICE A DAY   sildenafil  (VIAGRA ) 100 MG tablet Take 1 tablet (100 mg total) by mouth daily as needed for erectile dysfunction.     Allergies  Allergen Reactions   Hydrocortisone Hives   Other Swelling    Any topical bacitracin cream   Codeine Nausea Only   Contrast Media [Iodinated Contrast Media] Hives   Neosporin [Neomycin-Polymyxin-Gramicidin] Swelling    Social History   Socioeconomic History   Marital status: Media planner    Spouse name: Not on file   Number of children: 1   Years of education: BSBA   Highest education level: Not on file  Occupational History   Occupation: Sr Nutritional therapist  Tobacco Use   Smoking status: Former    Current packs/day: 0.00    Average packs/day: 1 pack/day for 12.0 years (12.0 ttl pk-yrs)    Types: Cigarettes    Start date: 11/09/1969    Quit date: 11/09/1981    Years since quitting: 42.4   Smokeless tobacco: Never  Substance and Sexual Activity   Alcohol use: Yes    Alcohol/week: 3.0 standard drinks of alcohol    Types: 3 Cans of beer per week     Comment: last drink was in november   Drug use: No   Sexual activity: Not Currently    Comment: Right Carotid endarectomy  Other Topics Concern   Not on file  Social History Narrative   Not on file   Social Drivers of Health   Financial Resource Strain: Not on file  Food Insecurity: No Food Insecurity (10/26/2023)   Hunger Vital Sign    Worried About Running Out of Food in the Last Year: Never true    Ran Out of Food in the Last Year: Never true  Transportation Needs: No Transportation Needs (10/26/2023)   PRAPARE - Administrator, Civil Service (Medical): No    Lack of Transportation (Non-Medical): No  Physical Activity: Not on file  Stress: Not on file  Social Connections: Socially Integrated (10/26/2023)   Social Connection and Isolation Panel    Frequency of Communication with Friends and Family: More than three times a week    Frequency of Social Gatherings with Friends and Family: More than three times a week    Attends Religious Services: More than 4 times per year    Active Member of [employer] or Organizations: Yes    Attends Engineer, structural: More than 4 times per year    Marital Status: Married  Catering manager Violence: Not At Risk (10/26/2023)   Humiliation, Afraid, Rape, and Kick questionnaire    Fear of Current or Ex-Partner: No    Emotionally Abused: No    Physically Abused: No    Sexually Abused: No     Review of Systems: General: negative for chills, fever, night sweats or weight changes.  Cardiovascular: negative for chest pain, dyspnea on exertion, edema, orthopnea, palpitations, paroxysmal nocturnal dyspnea or shortness of breath Dermatological: negative for rash Respiratory: negative for cough or wheezing Urologic: negative for hematuria Abdominal: negative for nausea, vomiting, diarrhea, bright red blood per rectum, melena, or hematemesis Neurologic: negative for visual changes, syncope, or dizziness All other systems reviewed  and are otherwise negative except as noted above.    Blood pressure (!) 164/90, pulse 66, height 5' 4.5 (1.638 m), weight 188 lb 9.6 oz (85.5 kg), SpO2 97%.  General appearance: alert and no distress Neck: no adenopathy, no carotid bruit, no JVD, supple, symmetrical, trachea midline, and thyroid not enlarged, symmetric, no tenderness/mass/nodules Lungs: clear to auscultation bilaterally Heart: regular rate and rhythm, S1, S2 normal, no murmur, click, rub or gallop Extremities: extremities normal, atraumatic, no cyanosis or edema Pulses: Decreased right pedal pulse Skin: Skin color, texture, turgor normal. No rashes or lesions Neurologic: Grossly normal  EKG not performed today      ASSESSMENT AND PLAN:   Peripheral arterial disease (HCC) [name]  returns today for follow-up of PAD and claudication.  I performed PTA and stenting of his calcified right common iliac artery 03/28/2021 placing a 8 mm x 29 mm long VBX covered stent at the takeoff of the profunda femoris deployed at 18 atm.  He did have 2 to 3 mm of stent protruding above the ostium of the right common iliac artery.  He has had disabling right lower extremity claudication with recent Dopplers suggesting potential in-stent restenosis versus Denovo disease.  His right ABI has declined from 1.11 down to 0.66.  He wishes to proceed with outpatient peripheral angiography and endovascular therapy for lifestyle-limiting claudication.     Dorn DOROTHA Lesches MD FACP,FACC,FAHA, [facility] 03/25/2024 8:52 AM

## 2024-03-25 NOTE — Assessment & Plan Note (Signed)
 Maurice Park  returns today for follow-up of PAD and claudication.  I performed PTA and stenting of his calcified right common iliac artery 03/28/2021 placing a 8 mm x 29 mm long VBX covered stent at the takeoff of the profunda femoris deployed at 18 atm.  He did have 2 to 3 mm of stent protruding above the ostium of the right common iliac artery.  He has had disabling right lower extremity claudication with recent Dopplers suggesting potential in-stent restenosis versus Denovo disease.  His right ABI has declined from 1.11 down to 0.66.  He wishes to proceed with outpatient peripheral angiography and endovascular therapy for lifestyle-limiting claudication.

## 2024-03-25 NOTE — Patient Instructions (Signed)
 Medication Instructions:  Your physician recommends that you continue on your current medications as directed. Please refer to the Current Medication list given to you today.  *If you need a refill on your cardiac medications before your next appointment, please call your pharmacy*  Lab Work: Your physician recommends that you return for lab work in: after August 11th for BMET & CBC   If you have labs (blood work) drawn today and your tests are completely normal, you will receive your results only by: [employer] (if you have MyChart) OR A paper copy in the mail If you have any lab test that is abnormal or we need to change your treatment, we will call you to review the results.  Testing/Procedures: Your physician has requested that you have an Aorta/Iliac Duplex. This will take place at [address] 4th floor  No food after 11PM the night before.  Water is OK. (Don't drink liquids if you have been instructed not to for ANOTHER test) Avoid foods that produce bowel gas, for 24 hours prior to exam (see below). No breakfast, no chewing gum, no smoking or carbonated beverages. Patient may take morning medications with water. Come in for test at least 15 minutes early to register. **To be done 1-2 weeks after procedure (9/11)**  Please note: We ask at that you not bring children with you during ultrasound (echo/ vascular) testing. Due to room size and safety concerns, children are not allowed in the ultrasound rooms during exams. Our front office staff cannot provide observation of children in our lobby area while testing is being conducted. An adult accompanying a patient to their appointment will only be allowed in the ultrasound room at the discretion of the ultrasound technician under special circumstances. We apologize for any inconvenience.   Your physician has requested that you have an ankle brachial index (ABI). During this test an ultrasound and blood pressure cuff are used  to evaluate the arteries that supply the arms and legs with blood. Allow thirty minutes for this exam. There are no restrictions or special instructions. This will take place at [address] 4th floor   **To be done 1-2 weeks after procedure (9/11)**   Please note: We ask at that you not bring children with you during ultrasound (echo/ vascular) testing. Due to room size and safety concerns, children are not allowed in the ultrasound rooms during exams. Our front office staff cannot provide observation of children in our lobby area while testing is being conducted. An adult accompanying a patient to their appointment will only be allowed in the ultrasound room at the discretion of the ultrasound technician under special circumstances. We apologize for any inconvenience. '  Follow-Up: At [facility], you and your health needs are our priority.  As part of our continuing mission to provide you with exceptional heart care, our providers are all part of one team.  This team includes your primary Cardiologist (physician) and Advanced Practice Providers or APPs (Physician Assistants and Nurse Practitioners) who all work together to provide you with the care you need, when you need it.  Your next appointment:   2-3 week(s) after procedure (9/11)  Provider:   Dorn Lesches, MD    We recommend signing up for the patient portal called MyChart.  Sign up information is provided on this After Visit Summary.  MyChart is used to connect with patients for Virtual Visits (Telemedicine).  Patients are able to view lab/test results, encounter notes, upcoming appointments, etc.  Non-urgent messages can be sent to your provider as well.   To learn more about what you can do with MyChart, go to [email or web address].   Other Instructions       Cardiac/Peripheral Catheterization   You are scheduled for a Peripheral Angiogram on Thursday, September 11 with Dr. Dorn Lesches.  1. Please  arrive at the [facility] (Main Entrance A) at [facility]: [address] [place], [place] [address] at 5:30 AM (This time is 2 hour(s) before your procedure to ensure your preparation).   Free valet parking service is available. You will check in at ADMITTING. The support person will be asked to wait in the waiting room.  It is OK to have someone drop you off and come back when you are ready to be discharged.        Special note: Every effort is made to have your procedure done on time. Please understand that emergencies sometimes delay scheduled procedures.  2. Diet: Do not eat solid foods after midnight.  You may have clear liquids until 5 AM the day of the procedure.  3. Labs: You will need to have blood drawn after August 11th- BMET & CBC   4. Medication instructions in preparation for your procedure:   Contrast Allergy: Yes, Please take Prednisone  50mg  by mouth at: Thirteen hours prior to cath 6:00pm on Wednesday Seven hours prior to cath 1:00am on Thursday And prior to leaving home please take last dose of Prednisone  50mg  and Benadryl  50mg  by mouth.    On the morning of your procedure, take Aspirin  81 mg and any morning medicines NOT listed above.  You may use sips of water.  5. Plan to go home the same day, you will only stay overnight if medically necessary. 6. You MUST have a responsible adult to drive you home. 7. An adult MUST be with you the first 24 hours after you arrive home. 8. Bring a current list of your medications, and the last time and date medication taken. 9. Bring ID and current insurance cards. 10.Please wear clothes that are easy to get on and off and wear slip-on shoes.  Thank you for allowing us  to care for you!   -- [facility]

## 2024-04-23 ENCOUNTER — Telehealth: Payer: Self-pay | Admitting: Cardiovascular Disease

## 2024-04-23 NOTE — Telephone Encounter (Signed)
*  STAT* If patient is at the pharmacy, call can be transferred to refill team.   1. Which medications need to be refilled? (please list name of each medication and dose if known)   ranolazine  (RANEXA ) 500 MG 12 hr tablet   2. Would you like to learn more about the convenience, safety, & potential cost savings by using the [facility]?   3. Are you open to using the [facility] (Type [facility]. ).  4. Which pharmacy/location (including street and city if local pharmacy) is medication to be sent to?  [id number] - [place], [place] - [address]   5. Do they need a 30 day or 90 day supply? 90 day  Patient stated he is completely out of this medication.

## 2024-04-25 MED ORDER — RANOLAZINE ER 500 MG PO TB12: 500.0000 mg | ORAL_TABLET | Freq: Two times a day (BID) | ORAL | 3 refills | Status: AC

## 2024-04-25 NOTE — Telephone Encounter (Signed)
 Pt calling to f/u on refill that needs to be sent to pharmacy on file as soon as possible. Please advise

## 2024-04-25 NOTE — Telephone Encounter (Signed)
 Rx sent to [facility]

## 2024-05-13 ENCOUNTER — Other Ambulatory Visit: Payer: Self-pay | Admitting: Cardiovascular Disease

## 2024-05-13 DIAGNOSIS — I739 Peripheral vascular disease, unspecified: Secondary | ICD-10-CM

## 2024-05-14 LAB — CBC WITH DIFFERENTIAL/PLATELET

## 2024-05-15 LAB — CBC WITH DIFFERENTIAL/PLATELET
Basos: 1
EOS (ABSOLUTE): 0 x10E3/uL (ref 0.0–0.2)
Eos: 2
Hematocrit: 44.3 (ref 37.5–51.0)
Hemoglobin: 14.5 g/dL (ref 13.0–17.7)
Immature Granulocytes: 0
Immature Granulocytes: 0 x10E3/uL (ref 0.0–0.1)
Lymphs: 24
MCH: 31.5 pg (ref 26.6–33.0)
MCHC: 32.7 g/dL (ref 31.5–35.7)
MCV: 96 fL (ref 79–97)
Monocytes Absolute: 0.1 x10E3/uL (ref 0.0–0.4)
Monocytes Absolute: 0.7 x10E3/uL (ref 0.1–0.9)
Monocytes: 13
Neutrophils Absolute: 1.2 x10E3/uL (ref 0.7–3.1)
Neutrophils Absolute: 3.1 x10E3/uL (ref 1.4–7.0)
Neutrophils: 60
Platelets: 194 x10E3/uL (ref 150–450)
RBC: 4.61 x10E6/uL (ref 4.14–5.80)
RDW: 11.6 (ref 11.6–15.4)
WBC: 5.1 x10E3/uL (ref 3.4–10.8)

## 2024-05-15 LAB — BASIC METABOLIC PANEL WITH GFR
BUN/Creatinine Ratio: 11 (ref 10–24)
BUN: 14 mg/dL (ref 8–27)
CO2: 23 mmol/L (ref 20–29)
Calcium: 9.6 mg/dL (ref 8.6–10.2)
Chloride: 102 mmol/L (ref 96–106)
Creatinine, Ser: 1.28 mg/dL — ABNORMAL HIGH (ref 0.76–1.27)
Glucose: 95 mg/dL (ref 70–99)
Potassium: 5.1 mmol/L (ref 3.5–5.2)
Sodium: 141 mmol/L (ref 134–144)
eGFR: 61 mL/min/1.73 (ref >59)

## 2024-05-17 ENCOUNTER — Ambulatory Visit: Payer: Self-pay | Admitting: Cardiovascular Disease

## 2024-05-17 DIAGNOSIS — I739 Peripheral vascular disease, unspecified: Secondary | ICD-10-CM

## 2024-05-21 ENCOUNTER — Telehealth: Payer: Self-pay | Admitting: Cardiovascular Disease

## 2024-05-21 MED ORDER — DIPHENHYDRAMINE HCL 50 MG PO TABS
50.0000 mg | ORAL_TABLET | Freq: Once | ORAL | 0 refills | Status: DC
Start: 2024-05-21 — End: 2024-05-27

## 2024-05-21 MED ORDER — PREDNISONE 50 MG PO TABS
ORAL_TABLET | ORAL | 0 refills | Status: DC
Start: 2024-05-21 — End: 2024-05-27

## 2024-05-21 NOTE — Telephone Encounter (Signed)
 Left message for patient that I would send instructions about his medications through MyChart. Advised to call back with any questions.

## 2024-05-21 NOTE — Telephone Encounter (Signed)
 Pt calling in about instructions for his procedure tomorrow, he knows to arrive by 530am at main entrance. He asked if he is supposed to take medication for contrast allergy, instructed stated prednisone  and benadryl  however he does not have any. Please advise.

## 2024-05-22 ENCOUNTER — Ambulatory Visit (HOSPITAL_COMMUNITY)
Admission: RE | Admit: 2024-05-22 | Discharge: 2024-05-23 | Disposition: A | Discharge status: Home / Self Care | Attending: Cardiovascular Disease | Admitting: Cardiovascular Disease

## 2024-05-22 ENCOUNTER — Ambulatory Visit (HOSPITAL_COMMUNITY)
Admission: RE | Disposition: A | Discharge status: Home / Self Care | Payer: Self-pay | Source: Home / Self Care | Attending: Cardiovascular Disease

## 2024-05-22 ENCOUNTER — Other Ambulatory Visit: Payer: Self-pay

## 2024-05-22 ENCOUNTER — Encounter (HOSPITAL_COMMUNITY): Payer: Self-pay | Admitting: Cardiovascular Disease

## 2024-05-22 DIAGNOSIS — Z79899 Other long term (current) drug therapy: Secondary | ICD-10-CM | POA: Insufficient documentation

## 2024-05-22 DIAGNOSIS — Y832 Surgical operation with anastomosis, bypass or graft as the cause of abnormal reaction of the patient, or of later complication, without mention of misadventure at the time of the procedure: Secondary | ICD-10-CM | POA: Insufficient documentation

## 2024-05-22 DIAGNOSIS — I10 Essential (primary) hypertension: Secondary | ICD-10-CM | POA: Insufficient documentation

## 2024-05-22 DIAGNOSIS — I70211 Atherosclerosis of native arteries of extremities with intermittent claudication, right leg: Secondary | ICD-10-CM | POA: Diagnosis not present

## 2024-05-22 DIAGNOSIS — I739 Peripheral vascular disease, unspecified: Secondary | ICD-10-CM | POA: Diagnosis present

## 2024-05-22 DIAGNOSIS — Z955 Presence of coronary angioplasty implant and graft: Secondary | ICD-10-CM | POA: Insufficient documentation

## 2024-05-22 DIAGNOSIS — I2510 Atherosclerotic heart disease of native coronary artery without angina pectoris: Secondary | ICD-10-CM | POA: Insufficient documentation

## 2024-05-22 DIAGNOSIS — T82856A Stenosis of peripheral vascular stent, initial encounter: Secondary | ICD-10-CM | POA: Insufficient documentation

## 2024-05-22 DIAGNOSIS — Z91041 Radiographic dye allergy status: Secondary | ICD-10-CM | POA: Insufficient documentation

## 2024-05-22 DIAGNOSIS — I708 Atherosclerosis of other arteries: Secondary | ICD-10-CM | POA: Insufficient documentation

## 2024-05-22 DIAGNOSIS — Z951 Presence of aortocoronary bypass graft: Secondary | ICD-10-CM | POA: Insufficient documentation

## 2024-05-22 DIAGNOSIS — Z7902 Long term (current) use of antithrombotics/antiplatelets: Secondary | ICD-10-CM | POA: Insufficient documentation

## 2024-05-22 DIAGNOSIS — E785 Hyperlipidemia, unspecified: Secondary | ICD-10-CM | POA: Insufficient documentation

## 2024-05-22 DIAGNOSIS — I701 Atherosclerosis of renal artery: Secondary | ICD-10-CM | POA: Diagnosis not present

## 2024-05-22 DIAGNOSIS — Z23 Encounter for immunization: Secondary | ICD-10-CM | POA: Insufficient documentation

## 2024-05-22 DIAGNOSIS — R7303 Prediabetes: Secondary | ICD-10-CM | POA: Diagnosis not present

## 2024-05-22 HISTORY — PX: ABDOMINAL AORTOGRAM: CATH118222

## 2024-05-22 HISTORY — PX: LOWER EXTREMITY ANGIOGRAPHY: CATH118251

## 2024-05-22 LAB — POCT ACTIVATED CLOTTING TIME
Activated Clotting Time: 228 s
Activated Clotting Time: 153 s

## 2024-05-22 SURGERY — LOWER EXTREMITY ANGIOGRAPHY
Anesthesia: LOCAL

## 2024-05-22 MED ORDER — MIDAZOLAM HCL 2 MG/2ML IJ SOLN
INTRAMUSCULAR | Status: AC
  Filled 2024-05-22: qty 2

## 2024-05-22 MED ORDER — SIMVASTATIN 20 MG PO TABS
20.0000 mg | ORAL_TABLET | Freq: Every day | ORAL | Status: DC
  Administered 2024-05-22 – 2024-05-23 (×2): 20 mg via ORAL
  Filled 2024-05-22 (×2): qty 1

## 2024-05-22 MED ORDER — ASPIRIN 81 MG PO TBEC
81.0000 mg | DELAYED_RELEASE_TABLET | Freq: Every day | ORAL | Status: DC
Start: 2024-05-23 — End: 2024-05-22

## 2024-05-22 MED ORDER — METOPROLOL SUCCINATE ER 50 MG PO TB24
50.0000 mg | ORAL_TABLET | Freq: Every day | ORAL | Status: DC
  Administered 2024-05-22: 50 mg via ORAL
  Filled 2024-05-22 (×2): qty 1

## 2024-05-22 MED ORDER — CLOPIDOGREL BISULFATE 300 MG PO TABS
ORAL_TABLET | ORAL | Status: AC
  Filled 2024-05-22: qty 1

## 2024-05-22 MED ORDER — FENTANYL CITRATE (PF) 100 MCG/2ML IJ SOLN
INTRAMUSCULAR | Status: DC | PRN
  Administered 2024-05-22: 25 ug via INTRAVENOUS

## 2024-05-22 MED ORDER — HEPARIN SODIUM (PORCINE) 1000 UNIT/ML IJ SOLN
INTRAMUSCULAR | Status: DC | PRN
  Administered 2024-05-22: 9000 [IU] via INTRAVENOUS

## 2024-05-22 MED ORDER — IODIXANOL 320 MG/ML IV SOLN
INTRAVENOUS | Status: DC | PRN
  Administered 2024-05-22: 70 mL via INTRA_ARTERIAL

## 2024-05-22 MED ORDER — SODIUM CHLORIDE 0.9% FLUSH: 3.0000 mL | INTRAVENOUS | Status: DC | PRN

## 2024-05-22 MED ORDER — DIPHENHYDRAMINE HCL 25 MG PO CAPS
50.0000 mg | ORAL_CAPSULE | Freq: Once | ORAL | Status: AC
Start: 2024-05-22 — End: 2024-05-22
  Administered 2024-05-22: 50 mg via ORAL
  Filled 2024-05-22: qty 2

## 2024-05-22 MED ORDER — SODIUM CHLORIDE 0.9 % IV SOLN: INTRAVENOUS | Status: AC

## 2024-05-22 MED ORDER — RANOLAZINE ER 500 MG PO TB12
500.0000 mg | ORAL_TABLET | Freq: Two times a day (BID) | ORAL | Status: DC
  Administered 2024-05-22 – 2024-05-23 (×2): 500 mg via ORAL
  Filled 2024-05-22 (×2): qty 1

## 2024-05-22 MED ORDER — LABETALOL HCL 5 MG/ML IV SOLN: 10.0000 mg | INTRAVENOUS | Status: DC | PRN

## 2024-05-22 MED ORDER — LIDOCAINE HCL (PF) 1 % IJ SOLN
INTRAMUSCULAR | Status: AC
  Filled 2024-05-22: qty 30

## 2024-05-22 MED ORDER — EZETIMIBE 10 MG PO TABS
10.0000 mg | ORAL_TABLET | Freq: Every day | ORAL | Status: DC
  Administered 2024-05-22 – 2024-05-23 (×2): 10 mg via ORAL
  Filled 2024-05-22 (×2): qty 1

## 2024-05-22 MED ORDER — INFLUENZA VAC SPLIT HIGH-DOSE 0.5 ML IM SUSY
0.5000 mL | PREFILLED_SYRINGE | INTRAMUSCULAR | Status: AC
  Administered 2024-05-23: 0.5 mL via INTRAMUSCULAR
  Filled 2024-05-22: qty 0.5

## 2024-05-22 MED ORDER — SODIUM CHLORIDE 0.9% FLUSH: 3.0000 mL | Freq: Two times a day (BID) | INTRAVENOUS | Status: DC

## 2024-05-22 MED ORDER — FENTANYL CITRATE (PF) 100 MCG/2ML IJ SOLN
INTRAMUSCULAR | Status: AC
  Filled 2024-05-22: qty 2

## 2024-05-22 MED ORDER — LIDOCAINE HCL (PF) 1 % IJ SOLN
INTRAMUSCULAR | Status: DC | PRN
  Administered 2024-05-22: 11 mL

## 2024-05-22 MED ORDER — FREE WATER
500.0000 mL | Freq: Once | Status: AC
  Administered 2024-05-22: 500 mL via ORAL

## 2024-05-22 MED ORDER — ASPIRIN 81 MG PO TBEC
81.0000 mg | DELAYED_RELEASE_TABLET | Freq: Every day | ORAL | Status: DC
Start: 2024-05-23 — End: 2024-05-23
  Administered 2024-05-23: 81 mg via ORAL
  Filled 2024-05-22: qty 1

## 2024-05-22 MED ORDER — ACETAMINOPHEN 325 MG PO TABS
650.0000 mg | ORAL_TABLET | ORAL | Status: DC | PRN
  Administered 2024-05-22: 650 mg via ORAL
  Filled 2024-05-22: qty 2

## 2024-05-22 MED ORDER — CLOPIDOGREL BISULFATE 75 MG PO TABS
75.0000 mg | ORAL_TABLET | Freq: Every day | ORAL | Status: DC
Start: 2024-05-23 — End: 2024-05-23
  Administered 2024-05-23: 75 mg via ORAL
  Filled 2024-05-22: qty 1

## 2024-05-22 MED ORDER — SODIUM CHLORIDE 0.9% FLUSH
3.0000 mL | Freq: Two times a day (BID) | INTRAVENOUS | Status: DC
  Administered 2024-05-22 – 2024-05-23 (×2): 3 mL via INTRAVENOUS

## 2024-05-22 MED ORDER — MIDAZOLAM HCL 2 MG/2ML IJ SOLN
INTRAMUSCULAR | Status: DC | PRN
  Administered 2024-05-22: 1 mg via INTRAVENOUS

## 2024-05-22 MED ORDER — RAMIPRIL 5 MG PO CAPS
5.0000 mg | ORAL_CAPSULE | Freq: Every day | ORAL | Status: DC
  Administered 2024-05-22 – 2024-05-23 (×2): 5 mg via ORAL
  Filled 2024-05-22 (×2): qty 1

## 2024-05-22 MED ORDER — HYDRALAZINE HCL 20 MG/ML IJ SOLN: 5.0000 mg | INTRAMUSCULAR | Status: DC | PRN

## 2024-05-22 MED ORDER — SODIUM CHLORIDE 0.9 % IV SOLN: 250.0000 mL | INTRAVENOUS | Status: DC | PRN

## 2024-05-22 MED ORDER — ONDANSETRON HCL 4 MG/2ML IJ SOLN: 4.0000 mg | Freq: Four times a day (QID) | INTRAMUSCULAR | Status: DC | PRN

## 2024-05-22 MED ORDER — HEPARIN SODIUM (PORCINE) 1000 UNIT/ML IJ SOLN
INTRAMUSCULAR | Status: AC
  Filled 2024-05-22: qty 10

## 2024-05-22 MED ORDER — HEPARIN (PORCINE) IN NACL 1000-0.9 UT/500ML-% IV SOLN
INTRAVENOUS | Status: DC | PRN
  Administered 2024-05-22 (×2): 500 mL via INTRA_ARTERIAL

## 2024-05-22 MED ORDER — EZETIMIBE-SIMVASTATIN 10-20 MG PO TABS: 1.0000 | ORAL_TABLET | Freq: Every day | ORAL | Status: DC

## 2024-05-22 MED ORDER — DIPHENHYDRAMINE HCL 25 MG PO CAPS: 50.0000 mg | ORAL_CAPSULE | Freq: Once | ORAL | Status: DC

## 2024-05-22 SURGICAL SUPPLY — 18 items
BALLOON MUSTANG 5.0X20 75 (BALLOONS) IMPLANT
CATH ANGIO 5F BER2 65CM (CATHETERS) IMPLANT
CATH ANGIO 5F PIGTAIL 65CM (CATHETERS) IMPLANT
COVER DOME SNAP 22 D (MISCELLANEOUS) IMPLANT
GLIDEWIRE ANGLED SS 035X260CM (WIRE) IMPLANT
INTRODUCER 7FR 23CM (INTRODUCER) IMPLANT
KIT ENCORE 26 ADVANTAGE (KITS) IMPLANT
KIT PV (KITS) ×1 IMPLANT
KIT SINGLE USE MANIFOLD (KITS) IMPLANT
KIT SYRINGE INJ CVI SPIKEX1 (MISCELLANEOUS) IMPLANT
SET ATX-X65L (MISCELLANEOUS) IMPLANT
SHEATH PINNACLE 5F 10CM (SHEATH) IMPLANT
SHEATH PINNACLE 7F 10CM (SHEATH) IMPLANT
SHEATH PROBE COVER 6X72 (BAG) IMPLANT
STENT VIABAHN 29XCATH 80 (Permanent Stent) IMPLANT
TAPE SHOOT N SEE (TAPE) IMPLANT
TRAY PV CATH (CUSTOM PROCEDURE TRAY) ×1 IMPLANT
WIRE HITORQ VERSACORE ST 145CM (WIRE) IMPLANT

## 2024-05-22 NOTE — Progress Notes (Signed)
   05/22/24 1519  Vitals  Temp 97.9 F (36.6 C)  Temp Source Oral  BP Location Right Arm  BP Method Automatic  Patient Position (if appropriate) Lying  Pulse Rate 81  Pulse Rate Source Monitor  ECG Heart Rate 81  Resp 18  MEWS COLOR  MEWS Score Color Green  Oxygen Therapy  SpO2 96 %  O2 Device Room Air  Height and Weight  Height 5' 4 (1.626 m)  Weight in (lb) to have BMI = 25 145.3  MEWS Score  MEWS Temp 0  MEWS Systolic 0  MEWS Pulse 0  MEWS RR 0  MEWS LOC 0  MEWS Score 0   Admit from cath lab

## 2024-05-23 ENCOUNTER — Other Ambulatory Visit (HOSPITAL_COMMUNITY): Payer: Self-pay

## 2024-05-23 ENCOUNTER — Encounter (HOSPITAL_COMMUNITY): Payer: Self-pay | Admitting: Cardiovascular Disease

## 2024-05-23 DIAGNOSIS — T82856A Stenosis of peripheral vascular stent, initial encounter: Secondary | ICD-10-CM | POA: Diagnosis not present

## 2024-05-23 DIAGNOSIS — I708 Atherosclerosis of other arteries: Secondary | ICD-10-CM | POA: Diagnosis not present

## 2024-05-23 DIAGNOSIS — Z23 Encounter for immunization: Secondary | ICD-10-CM | POA: Diagnosis not present

## 2024-05-23 DIAGNOSIS — I70211 Atherosclerosis of native arteries of extremities with intermittent claudication, right leg: Secondary | ICD-10-CM | POA: Diagnosis not present

## 2024-05-23 LAB — CBC
HCT: 42.1 % (ref 39.0–52.0)
Hemoglobin: 14.2 g/dL (ref 13.0–17.0)
MCH: 32.1 pg (ref 26.0–34.0)
MCHC: 33.7 g/dL (ref 30.0–36.0)
MCV: 95.2 fL (ref 80.0–100.0)
Platelets: 171 K/uL (ref 150–400)
RBC: 4.42 MIL/uL (ref 4.22–5.81)
RDW: 12.3 % (ref 11.5–15.5)
WBC: 11.5 K/uL — ABNORMAL HIGH (ref 4.0–10.5)
nRBC: 0 % (ref 0.0–0.2)

## 2024-05-23 LAB — BASIC METABOLIC PANEL WITH GFR
Anion gap: 9 (ref 5–15)
BUN: 13 mg/dL (ref 8–23)
CO2: 23 mmol/L (ref 22–32)
Calcium: 8.2 mg/dL — ABNORMAL LOW (ref 8.9–10.3)
Chloride: 107 mmol/L (ref 98–111)
Creatinine, Ser: 1.11 mg/dL (ref 0.61–1.24)
GFR, Estimated: >60 mL/min (ref >60)
Glucose, Bld: 127 mg/dL — ABNORMAL HIGH (ref 70–99)
Potassium: 3.4 mmol/L — ABNORMAL LOW (ref 3.5–5.1)
Sodium: 139 mmol/L (ref 135–145)

## 2024-05-23 LAB — LIPID PANEL
Cholesterol: 129 mg/dL (ref 0–200)
HDL: 43 mg/dL (ref >40)
LDL Cholesterol: 69 mg/dL (ref 0–99)
Total CHOL/HDL Ratio: 3 ratio
Triglycerides: 85 mg/dL (ref <150)
VLDL: 17 mg/dL (ref 0–40)

## 2024-05-23 MED ORDER — EZETIMIBE 10 MG PO TABS
10.0000 mg | ORAL_TABLET | Freq: Every day | ORAL | 3 refills | Status: AC
  Filled 2024-05-23: qty 90, 90d supply, fill #0

## 2024-05-23 MED ORDER — CLOPIDOGREL BISULFATE 75 MG PO TABS
75.0000 mg | ORAL_TABLET | Freq: Every day | ORAL | 3 refills | Status: DC
  Filled 2024-05-23: qty 90, 90d supply, fill #0

## 2024-05-23 MED ORDER — ATORVASTATIN CALCIUM 80 MG PO TABS: 80.0000 mg | ORAL_TABLET | Freq: Every day | ORAL | Status: DC

## 2024-05-23 MED ORDER — ATORVASTATIN CALCIUM 80 MG PO TABS
80.0000 mg | ORAL_TABLET | Freq: Every day | ORAL | 11 refills | Status: DC
  Filled 2024-05-23: qty 30, 30d supply, fill #0

## 2024-05-23 MED ORDER — POTASSIUM CHLORIDE CRYS ER 20 MEQ PO TBCR
40.0000 meq | EXTENDED_RELEASE_TABLET | Freq: Once | ORAL | Status: AC
  Administered 2024-05-23: 40 meq via ORAL
  Filled 2024-05-23: qty 2

## 2024-05-23 MED FILL — Clopidogrel Bisulfate Tab 300 MG (Base Equiv): ORAL | Qty: 20 | Status: AC

## 2024-05-23 NOTE — Discharge Summary (Signed)
 Discharge Summary   Patient ID: Maurice Park MRN: [id number]; DOB: November 24, 1955  Admit date: 05/22/2024 Discharge date: 05/23/2024  PCP:  Maurice Kins, MD   [facility] Providers Cardiologist:  Maurice Lesches, MD     Discharge Diagnoses  Principal Problem:   Claudication in peripheral vascular disease [facility]) Active Problems:   CAD (coronary artery disease), with CABG [place] to LAD, Lt. radial artery to 1st diagnal, sequential SVG to PDA and PLA, 12/20/09   Hyperlipidemia, controlled   Renal artery stenosis (HCC)   Essential hypertension   Prediabetes   Peripheral arterial disease (HCC)   Diagnostic Studies/Procedures   Abdominal aortogram 05/22/24: Final Impression: Successful VBX recovered stenting for in-stent restenosis for lifestyle-limiting claudication.  The patient did receive 300 mg of p.o. clopidogrel .  The sheath will be removed once ACT falls below 170 pressure will be held.  He will be hydrated overnight discharged in the morning on DAPT.  Will obtain aortoiliac Dopplers in our [employer] office next week and I will see him back 1 to 2 weeks thereafter.  He left the lab in stable condition.  _____________   History of Present Illness   Maurice Park is a 68 y.o. male with  a history of CAD and PVOD.   Per Dr. Lesches:  I stented his posterolateral branch back February 21, 2000. He had moderate LAD and circumflex disease at that time. He was recatheterization in 2011 revealing 60% in-stent restenosis of his PLA stent which I redilated. He also had right renal artery stenosis which I stented as well. Dr. [name] performed elective right carotid endarterectomy on him in 2007 which we follow by duplex ultrasound. He developed crescendo angina and was catheterized by Dr. [name] December 24, 2009, revealing left main 3-vessel disease. He underwent coronary artery bypass grafting x4 December 20, 2009, with a LIMA to his LAD, a left radial to a diagonal branch, sequential  vein to the PDA and PLA system. His postop course was complicated by prolonged paroxysmal atrial fibrillation which he recuperated from nicely. <BR><BR>He had a Myoview stress test performed March 22, 2010, which was nonischemic. Renal Dopplers continue to show widely patent right renal artery stent and carotid Dopplers show patent endarterectomy site. He denies chest pain or shortness of breath. He was catheterized October 02, 2010, revealing an occluded left radial graft to a diagonal branch, patent LIMA to the LAD and a patent sequential vein to the PDA and PLA with normal LV function and patent right renal artery stent. His last lipid profile was a year ago. Since I saw him 18 months ago he's remained completely stable. He is working out with a Psychologist, educational 2 days a week. His son Maurice Park is now 99 years old and has gotten into [employer]his wife of 25 years, Maurice Park, died 2013/06/24of ALS. He remarried to his current wife Maurice Park on 01/29/16.       He is very active and plays tennis for long periods of time without symptoms.  His lipid profile and hemoglobin A1c are in the therapeutic range.  Recent carotid and renal Dopplers were normal.  He does complain of some right calf claudication however which is new over the last 6 to 8 months.  I obtain lower extremity arterial Dopplers on him 02/17/2021 revealed a right ABI of 0.92 and a left of 1.18.  He did have a high-frequency signal in his right iliac artery suggesting high-grade disease probably contributing to his lifestyle  of any claudication.  I performed peripheral angiography, orbital atherectomy and VBX covered stenting of a high-grade calcified proximal right common iliac artery stenosis with an excellent result.  He was discharged home the following day.  His Dopplers performed 04/04/2021 showed normalization of an 8 of his ABI and his velocities.  His hip pain and claudication have resolved.  He is back playing tennis without limitation.   He  apparently fell while playing tennis 10/24/2023 and hit his head.  He did have a cranial bleed and was followed by Dr. [name].  He was out of work for a month and has just gone back to work recently.  He did have some visual impairment initially.   Since I saw him 2 months ago he has been complaining of right lower extremity claudication.  He is undergoing physical therapy and lithotripsy therapy with potential tear of some ligaments although his most recent Doppler studies reveal a decline in his right ABI from 1.11 down to 0.66 suggesting either restenosis or Denovo lesion in the right iliac system.  He wishes to proceed with outpatient angiography and endovascular therapy for lifestyle-limiting claudication.  He presented for scheduled abdominal aortogram.  Hospital Course   Consultants: none  PAD ISR of right common iliac artery Patent right renal artery stent. Left lower extremity widely patent. Right lower extremity with occluded right common iliac artery stent consistent with doppler studies and claudication. CTO crossed with glidwire with insertion of VBX covered stent.  He tolerated the procedure well and has ambulated.    Hyperlipidemia with LDL goal < 55 Increased lipitor to 80 mg and continued 10 mg zetia  May benefit from PCSK9i - lipid clinic referral OP   CAD s/p CABG Hypertension RAS - continue home medications   Pt seen and examined, deemed stable for discharge. Follow up has been arranged.      Did the patient have an acute coronary syndrome (MI, NSTEMI, STEMI, etc) this admission?:  No                               Did the patient have a percutaneous coronary intervention (stent / angioplasty)?:  No.        The patient will be scheduled for a TOC follow up appointment in 7-14 days.  A message has been sent to the [facility] and Scheduling Pool at the office where the patient should be seen for follow up.  _____________  Discharge Vitals Blood pressure (!)  143/77, pulse (!) 59, temperature 98 F (36.7 C), temperature source Oral, resp. rate 20, height 5' 4 (1.626 m), weight 82.6 kg, SpO2 96%.  Filed Weights   05/22/24 0559  Weight: 82.6 kg    Labs & Radiologic Studies  CBC Recent Labs    05/23/24 0402  WBC 11.5*  HGB 14.2  HCT 42.1  MCV 95.2  PLT 171   Basic Metabolic Panel Recent Labs    90/87/74 0402  NA 139  K 3.4*  CL 107  CO2 23  GLUCOSE 127*  BUN 13  CREATININE 1.11  CALCIUM  8.2*   Liver Function Tests No results for input(s): AST, ALT, ALKPHOS, BILITOT, PROT, ALBUMIN in the last 72 hours. No results for input(s): LIPASE, AMYLASE in the last 72 hours. High Sensitivity Troponin:   No results for input(s): TROPONINIHS in the last 720 hours.  No results for input(s): TRNPT in the last 720 hours.  BNP Invalid  input(s): POCBNP No results for input(s): PROBNP in the last 72 hours.  No results for input(s): BNP in the last 72 hours.  D-Dimer No results for input(s): DDIMER in the last 72 hours. Hemoglobin A1C No results for input(s): HGBA1C in the last 72 hours. Fasting Lipid Panel Recent Labs    05/23/24 0402  CHOL 129  HDL 43  LDLCALC 69  TRIG 85  CHOLHDL 3.0   No results found for: LIPOA  Thyroid Function Tests No results for input(s): TSH, T4TOTAL, T3FREE, THYROIDAB in the last 72 hours.  Invalid input(s): FREET3 _____________  PERIPHERAL VASCULAR CATHETERIZATION Result Date: 05/22/2024 Images from the original result were not included.  [id number] LOCATION:  FACILITY: [facility] PHYSICIAN: Maurice Park, M.D. Oct 02, 1955 DATE OF PROCEDURE:  05/22/2024 DATE OF DISCHARGE: PV Angiogram/Intervention History obtained from chart review. Maurice Park is a 68 y.o.   widowed (wife died from ALS 03-03-12) [place] male, father of 1 son [name]) who I last saw in the office 01/08/2024.[name]   His son graduated from [place] and moved to [place]. [place]  where he plans to  study prosthetics. He remarried to Maurice Park, who works as a Public librarian at a [employer].  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  He has a history of CAD and PVOD. I stented his posterolateral branch back February 21, 2000. He had moderate LAD and circumflex disease at that time. He was recatheterization in 2011 revealing 60% in-stent restenosis of his PLA stent which I redilated. He also had right renal artery stenosis which I stented as well. Dr. [name] performed elective right carotid endarterectomy on him in 2007 which we follow by duplex ultrasound. He developed crescendo angina and was catheterized by Dr. [name] December 24, 2009, revealing left main 3-vessel disease. He underwent coronary artery bypass grafting x4 December 20, 2009, with a LIMA to his LAD, a left radial to a diagonal branch, sequential vein to the PDA and PLA system. His postop course was complicated by prolonged paroxysmal atrial fibrillation which he recuperated from nicely. <BR><BR>He had a Myoview stress test performed March 22, 2010, which was nonischemic. Renal Dopplers continue to show widely patent right renal artery stent and carotid Dopplers show patent endarterectomy site. He denies chest pain or shortness of breath. He was catheterized October 02, 2010, revealing an occluded left radial graft to a diagonal branch, patent LIMA to the LAD and a patent sequential vein to the PDA and PLA with normal LV function and patent right renal artery stent. His last lipid profile was a year ago. Since I saw him 18 months ago he's remained completely stable. He is working out with a Psychologist, educational 2 days a week. His son Maurice Park is now 11 years old and has gotten into [employer]his wife of 25 years, Maurice Park, died 2013/06/20of ALS. He remarried to his current wife Maurice Park on 01/29/16.     He is very active and plays tennis for long periods of time without symptoms.  His lipid profile and  hemoglobin A1c are in the therapeutic range.  Recent carotid and renal Dopplers were normal.  He does complain of some right calf claudication however which is new over the last 6 to 8 months. I obtain lower extremity arterial Dopplers on him 02/17/2021 revealed a right ABI of 0.92 and a left of 1.18.  He did have a high-frequency signal in his  right iliac artery suggesting high-grade disease probably contributing to his lifestyle of any claudication. I performed peripheral angiography, orbital atherectomy and VBX covered stenting of a high-grade calcified proximal right common iliac artery stenosis with an excellent result.  He was discharged home the following day.  His Dopplers performed 04/04/2021 showed normalization of an 8 of his ABI and his velocities.  His hip pain and claudication have resolved.  He is back playing tennis without limitation.  He apparently fell while playing tennis 10/24/2023 and hit his head.  He did have a injury cranial bleed and was followed by Dr. [name].  He was out of work for a month and has just gone back to work recently.  He did have some visual impairment initially.  Since I saw him 2 months ago he has been complaining of right lower extremity claudication.  He is undergoing physical therapy and lithotripsy therapy with potential tear of some ligaments although his most recent Doppler studies reveal a decline in his right ABI from 1.11 down to 0.66 suggesting either restenosis or Denovo lesion in the right iliac system.  He wishes to proceed with outpatient angiography and endovascular therapy for lifestyle-limiting claudication.  Pre Procedure Diagnosis: Peripheral arterial disease Post Procedure Diagnosis: Peripheral arterial disease Operators: Dr. Dorn Park Procedures Performed:  1.  Ultrasound-guided right common femoral access  2.  Abdominal aortogram/bilateral iliac angiogram  3.  PTA and covered stenting right common iliac artery  PROCEDURE DESCRIPTION: The patient  was brought to the second floor [place] [facility] cath lab in the the postabsorptive state. He was premedicated with IV Versed  and fentanyl .  He was also premedicated with prednisone  and Benadryl  for contrast allergy prophylaxis.  His right groin was prepped and shaved in usual sterile fashion. Xylocaine  1% was used for local anesthesia. A 5 French sheath was inserted into the right common femoral artery using standard Seldinger technique.  I was able to cross the right common iliac artery CTO with a 5 [place] Berenstein catheter and a 035 Glidewire.  The next crossed the CTO and exchanged over a versa core wire for a pigtail catheter.  I performed abdominal aorta aortography and bilateral iliac angiography.  On the pigtails used for the entirety of the case.  (70 cc of contrast total for the patient).  Retrograde Berry pressures monitored in the case.  Angiographic Data: 1: Abdominal aorta-previously placed right renal artery stent was widely patent.  The infrarenal abdominal aorta is free of atherosclerotic changes 2: Left lower extremity-widely patent 3: Right lower extremity-previously diagnosed right common leg artery stent was occluded in its midportion   Maurice Park  has an occluded right common iliac artery stent corresponding to his abnormal Doppler studies and his new symptoms of hip pain consistent with claudication.  Will proceed with PTA and recovered stenting. Procedure Description: The 5 French sheath was exchanged over an 035 wire for a 7 French 21 cm long Brite tip sheath.  Patient received 9000's of heparin  with an ACT of 302.  70 cc of contrast used during the case.  I was initially able to cross the CTO with a 035 65 Glidewire exchanged for a versa core wire.  I then performed predilatation with a 5 mm x 2 cm Maverick over-the-wire balloon.  Following this I restented the CTO with a 8 mm x 29 mm long VBX stent covering the entirety of the previously placed stent deployed at 12 atm resulting  reduction with total occlusion to 0%  residual.  Patient tolerated procedure well.  The long sheath was exchanged over the versa core wire for a short 7 [place] sheath which was then secured in place. Final Impression: Successful VBX recovered stenting for in-stent restenosis for lifestyle-limiting claudication.  The patient did receive 300 mg of p.o. clopidogrel .  The sheath will be removed once ACT falls below 170 pressure will be held.  He will be hydrated overnight discharged in the morning on DAPT.  Will obtain aortoiliac Dopplers in our [employer] office next week and I will see him back 1 to 2 weeks thereafter.  He left the lab in stable condition. Maurice Park. MD, [facility] 05/22/2024 8:49 AM     Disposition Pt is being discharged home today in good condition.  Follow-up Plans & Appointments  Discharge Instructions     Diet - low sodium heart healthy   Complete by: As directed    Discharge instructions   Complete by: As directed    No driving until follow up. No lifting over 5 lbs for 1 week. No sexual activity for 1 week. Keep procedure site clean & dry. If you notice increased pain, swelling, bleeding or pus, call/return!  You may shower, but no soaking baths/hot tubs/pools for 1 week.   Increase activity slowly   Complete by: As directed        Discharge Medications Allergies as of 05/23/2024       Reactions   Hydrocortisone Hives   Latex Other (See Comments)   Unknown   Other Swelling   Any topical bacitracin cream   Codeine Nausea Only   Contrast Media [iodinated Contrast Media] Hives   Neosporin [neomycin-polymyxin-gramicidin] Swelling        Medication List     STOP taking these medications    Advil  200 MG tablet Generic drug: ibuprofen    ezetimibe -simvastatin  10-20 MG tablet Commonly known as: VYTORIN    meloxicam 15 MG tablet Commonly known as: MOBIC       TAKE these medications    acetaminophen  500 MG tablet Commonly known as: TYLENOL  Take  1,000 mg by mouth every 8 (eight) hours as needed for mild pain (pain score 1-3) or moderate pain (pain score 4-6).   aspirin  EC 81 MG tablet Take 81 mg by mouth at bedtime.   atorvastatin  80 MG tablet Commonly known as: LIPITOR Take 1 tablet (80 mg total) by mouth daily. Start taking on: May 24, 2024   clopidogrel  75 MG tablet Commonly known as: PLAVIX  Take 1 tablet (75 mg total) by mouth daily with breakfast. Start taking on: May 24, 2024   diphenhydrAMINE  50 MG tablet Commonly known as: BENADRYL  Take 1 tablet (50 mg total) by mouth once for 1 dose. Take 1 tablet 2 hour prior to your procedure.   ezetimibe  10 MG tablet Commonly known as: ZETIA  Take 1 tablet (10 mg total) by mouth daily. Start taking on: May 24, 2024   fluticasone  50 MCG/ACT nasal spray Commonly known as: FLONASE  Place 2 sprays into both nostrils daily.   hydrALAZINE  25 MG tablet Commonly known as: APRESOLINE  Take 1 tablet (25 mg total) by mouth in the morning and at bedtime.   metoprolol  succinate 50 MG 24 hr tablet Commonly known as: TOPROL -XL TAKE 1 TABLET BY MOUTH EVERY DAY WITH OR IMMEDIATELY FOLLOWING A MEAL   multivitamin capsule Take 1 capsule by mouth daily.   Neurontin 100 MG capsule Generic drug: gabapentin Take 100 mg by mouth daily as needed (Frozen shoulder).   nitroGLYCERIN   0.4 MG SL tablet Commonly known as: NITROSTAT  Place 1 tablet (0.4 mg total) under the tongue every 5 (five) minutes as needed for up to 25 days for chest pain.   predniSONE  50 MG tablet Commonly known as: DELTASONE  Please take 1 tablet 13 hours prior to your procedure, 1 tablet 7 hours prior to your procedure and 1 tablet 2 hours prior to you procedure.   ramipril  5 MG capsule Commonly known as: ALTACE  TAKE 1 CAPSULE BY MOUTH EVERY DAY   ranolazine  500 MG 12 hr tablet Commonly known as: RANEXA  Take 1 tablet (500 mg total) by mouth 2 (two) times daily.   sildenafil  100 MG tablet Commonly  known as: VIAGRA  Take 1 tablet (100 mg total) by mouth daily as needed for erectile dysfunction.         Outstanding Labs/Studies   Duration of Discharge Encounter: APP Time: 25 minutes   Signed, Jon Nat Hails, PA 05/23/2024, 12:05 PM

## 2024-05-23 NOTE — Plan of Care (Signed)

## 2024-05-23 NOTE — TOC CM/SW Note (Signed)
 Transition of Care [facility]) - Inpatient Brief Assessment   Patient Details  Name: Maurice Park MRN: [id number] Date of Birth: 09-25-1955  [facility]) CM/SW Contact:    Sudie Erminio Deems, RN Phone Number: 05/23/2024, 12:32 PM   Clinical Narrative: Patient presented for claudication in peripheral vascular disease. PTA patient was independent from home with spouse. Patient has DME CPAP at home. Patient states he has insurance, PCP and has no issues with transportation. No home needs identified at this time.Spouse is at the bedside to assist with transportation home via private vehicle.    Transition of Care Asessment: Insurance and Status: Insurance coverage has been reviewed Patient has primary care physician: Yes Home environment has been reviewed: reviewed Prior level of function:: independent Prior/Current Home Services: No current home services Social Drivers of Health Review: SDOH reviewed no interventions necessary Readmission risk has been reviewed: Yes Transition of care needs: no transition of care needs at this time

## 2024-05-26 LAB — POCT ACTIVATED CLOTTING TIME: Activated Clotting Time: 302 s

## 2024-05-27 ENCOUNTER — Ambulatory Visit: Attending: Cardiovascular Disease | Admitting: Cardiovascular Disease

## 2024-05-27 ENCOUNTER — Encounter: Payer: Self-pay | Admitting: Cardiovascular Disease

## 2024-05-27 ENCOUNTER — Other Ambulatory Visit: Payer: Self-pay

## 2024-05-27 VITALS — BP 124/68 | HR 74 | Ht 64.5 in | Wt 187.5 lb

## 2024-05-27 DIAGNOSIS — I2510 Atherosclerotic heart disease of native coronary artery without angina pectoris: Secondary | ICD-10-CM

## 2024-05-27 DIAGNOSIS — I739 Peripheral vascular disease, unspecified: Secondary | ICD-10-CM

## 2024-05-27 DIAGNOSIS — R079 Chest pain, unspecified: Secondary | ICD-10-CM

## 2024-05-27 DIAGNOSIS — I10 Essential (primary) hypertension: Secondary | ICD-10-CM

## 2024-05-27 NOTE — Progress Notes (Signed)
 05/27/2024 Park Park   1956-01-22  [id number]  Primary Physician Loreli Kins, MD Primary Cardiologist: Maurice JINNY Lesches MD [facility], [place], [place]  HPI:  Park Park is a 68 y.o.   widowed (wife died from ALS 03-03-12) [place] male, father of 1 son Park Park) who I last saw in the office 03/25/2024.[name]   His son graduated from [place] and moved to [place]. [place]  where he plans to study prosthetics. He remarried to Park Park, who works as a Public librarian at [employer], and who accompanies him today.  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  He has a history of CAD and PVOD. I stented his posterolateral branch back February 21, 2000. He had moderate LAD and circumflex disease at that time. He was recatheterization in 2011 revealing 60% in-stent restenosis of his PLA stent which I redilated. He also had right renal artery stenosis which I stented as well. Park Park performed elective right carotid endarterectomy on him in 2007 which we follow by duplex ultrasound. He developed crescendo angina and was catheterized by Park Park December 24, 2009, revealing left main 3-vessel disease. He underwent coronary artery bypass grafting x4 December 20, 2009, with a LIMA to his LAD, a left radial to a diagonal branch, sequential vein to the PDA and PLA system. His postop course was complicated by prolonged paroxysmal atrial fibrillation which he recuperated from nicely. <BR><BR>He had a Myoview stress test performed March 22, 2010, which was nonischemic. Renal Dopplers continue to show widely patent right renal artery stent and carotid Dopplers show patent endarterectomy site. He denies chest pain or shortness of breath. He was catheterized October 02, 2010, revealing an occluded left radial graft to a diagonal branch, patent LIMA to the LAD and a patent sequential vein to the PDA and PLA with normal LV function and patent right renal artery  stent. His last lipid profile was a year ago. Since I saw him 18 months ago he's remained completely stable. He is working out with a Psychologist, educational 2 days a week. His son Park Park is now 41 years old and has gotten into [employer]his wife of 25 years, Park Park, died 2013/06/26of ALS. He remarried to his current wife Park Park on 01/29/16.       He is very active and plays tennis for long periods of time without symptoms.  His lipid profile and hemoglobin A1c are in the therapeutic range.  Recent carotid and renal Dopplers were normal.  He does complain of some right calf claudication however which is new over the last 6 to 8 months.  I obtain lower extremity arterial Dopplers on him 02/17/2021 revealed a right ABI of 0.92 and a left of 1.18.  He did have a high-frequency signal in his right iliac artery suggesting high-grade disease probably contributing to his lifestyle of any claudication.  I performed peripheral angiography, orbital atherectomy and VBX covered stenting of a high-grade calcified proximal right common iliac artery stenosis with an excellent result.  He was discharged home the following day.  His Dopplers performed 04/04/2021 showed normalization of an 8 of his ABI and his velocities.  His hip pain and claudication have resolved.  He is back playing tennis without limitation.   He apparently fell while playing tennis 10/24/2023 and hit his head.  He did have a injury cranial bleed and was followed by  [name].  He was out of work for a month and has just gone back to work recently.  He did have some visual impairment initially.   He has been complaining of right lower extremity claudication.  He is undergoing physical therapy and lithotripsy therapy with potential tear of some ligaments although his most recent Doppler studies reveal a decline in his right ABI from 1.11 down to 0.66 suggesting either restenosis or Denovo lesion in the right iliac system.  He wishes to proceed with  outpatient angiography and endovascular therapy for lifestyle-limiting claudication.  I performed outpatient peripheral angiography on him 05/22/2024 revealing occluded right common neck artery stent which is I was able to cross and restent with a 8 mm x 29 mm long VBX stent.  He was discharged home the following day.  He had some issues with sheath pull and bleeding which ultimately was controlled.  He says his claudication is improved.  He developed 2 episodes of nitro responsive chest pain this morning but none since that time.  He does admit to going back to work Park and worked harder than he should have.   Current Meds  Medication Sig   acetaminophen  (TYLENOL ) 500 MG tablet Take 1,000 mg by mouth every 8 (eight) hours as needed for mild pain (pain score 1-3) or moderate pain (pain score 4-6).   aspirin  EC 81 MG tablet Take 81 mg by mouth at bedtime.   atorvastatin  (LIPITOR) 80 MG tablet Take 1 tablet (80 mg total) by mouth daily.   clopidogrel  (PLAVIX ) 75 MG tablet Take 1 tablet (75 mg total) by mouth daily with breakfast.   ezetimibe  (ZETIA ) 10 MG tablet Take 1 tablet (10 mg total) by mouth daily.   fluticasone  (FLONASE ) 50 MCG/ACT nasal spray Place 2 sprays into both nostrils daily.   gabapentin (NEURONTIN) 100 MG capsule Take 100 mg by mouth daily as needed (Frozen shoulder).   hydrALAZINE  (APRESOLINE ) 25 MG tablet Take 1 tablet (25 mg total) by mouth in the morning and at bedtime.   metoprolol  succinate (TOPROL -XL) 50 MG 24 hr tablet TAKE 1 TABLET BY MOUTH EVERY DAY WITH OR IMMEDIATELY FOLLOWING A MEAL   Multiple Vitamin (MULTIVITAMIN) capsule Take 1 capsule by mouth daily.   nitroGLYCERIN  (NITROSTAT ) 0.4 MG SL tablet Place 1 tablet (0.4 mg total) under the tongue every 5 (five) minutes as needed for up to 25 days for chest pain.   ramipril  (ALTACE ) 5 MG capsule TAKE 1 CAPSULE BY MOUTH EVERY DAY   ranolazine  (RANEXA ) 500 MG 12 hr tablet Take 1 tablet (500 mg total) by mouth 2 (two) times  daily.   sildenafil  (VIAGRA ) 100 MG tablet Take 1 tablet (100 mg total) by mouth daily as needed for erectile dysfunction.     Allergies  Allergen Reactions   Hydrocortisone Hives   Latex Other (See Comments)    Unknown   Other Swelling    Any topical bacitracin cream   Codeine Nausea Only   Contrast Media [Iodinated Contrast Media] Hives   Neosporin [Neomycin-Polymyxin-Gramicidin] Swelling    Social History   Socioeconomic History   Marital status: Media planner    Spouse name: Not on file   Number of children: 1   Years of education: BSBA   Highest education level: Not on file  Occupational History   Occupation: Sr Nutritional therapist  Tobacco Use   Smoking status: Former    Current packs/day: 0.00    Average packs/day: 1 pack/day for 12.0 years (  12.0 ttl pk-yrs)    Types: Cigarettes    Start date: 11/09/1969    Quit date: 11/09/1981    Years since quitting: 42.5   Smokeless tobacco: Never  Substance and Sexual Activity   Alcohol use: Yes    Alcohol/week: 3.0 standard drinks of alcohol    Types: 3 Cans of beer per week    Comment: last drink was in november   Drug use: No   Sexual activity: Not Currently    Comment: Right Carotid endarectomy  Other Topics Concern   Not on file  Social History Narrative   Not on file   Social Drivers of Health   Financial Resource Strain: Not on file  Food Insecurity: No Food Insecurity (05/22/2024)   Hunger Vital Sign    Worried About Running Out of Food in the Last Year: Never true    Ran Out of Food in the Last Year: Never true  Transportation Needs: No Transportation Needs (05/22/2024)   PRAPARE - Administrator, Civil Service (Medical): No    Lack of Transportation (Non-Medical): No  Physical Activity: Not on file  Stress: Not on file  Social Connections: Socially Integrated (05/22/2024)   Social Connection and Isolation Panel    Frequency of Communication with Friends and Family: More than three times a week     Frequency of Social Gatherings with Friends and Family: More than three times a week    Attends Religious Services: More than 4 times per year    Active Member of [employer] or Organizations: Yes    Attends Engineer, structural: More than 4 times per year    Marital Status: Married  Catering manager Violence: Not At Risk (05/22/2024)   Humiliation, Afraid, Rape, and Kick questionnaire    Fear of Current or Ex-Partner: No    Emotionally Abused: No    Physically Abused: No    Sexually Abused: No     Review of Systems: General: negative for chills, fever, night sweats or weight changes.  Cardiovascular: negative for chest pain, dyspnea on exertion, edema, orthopnea, palpitations, paroxysmal nocturnal dyspnea or shortness of breath Dermatological: negative for rash Respiratory: negative for cough or wheezing Urologic: negative for hematuria Abdominal: negative for nausea, vomiting, diarrhea, bright red blood per rectum, melena, or hematemesis Neurologic: negative for visual changes, syncope, or dizziness All other systems reviewed and are otherwise negative except as noted above.    Blood pressure 124/68, pulse 74, height 5' 4.5 (1.638 m), weight 187 lb 8 oz (85 kg), SpO2 95%.  General appearance: alert and no distress Neck: no adenopathy, no carotid bruit, no JVD, supple, symmetrical, trachea midline, and thyroid not enlarged, symmetric, no tenderness/mass/nodules Lungs: clear to auscultation bilaterally Heart: regular rate and rhythm, S1, S2 normal, no murmur, click, rub or gallop Extremities: extremities normal, atraumatic, no cyanosis or edema Pulses: 2+ and symmetric Skin: Skin color, texture, turgor normal. No rashes or lesions Neurologic: Grossly normal  EKG EKG Interpretation Date/Time:  Tuesday May 27 2024 14:43:29 EDT Ventricular Rate:  74 PR Interval:  148 QRS Duration:  78 QT Interval:  386 QTC Calculation: 428 R Axis:   46  Text  Interpretation: Normal sinus rhythm Possible Left atrial enlargement Nonspecific T wave abnormality When compared with ECG of 23-May-2024 03:31, No significant change was found Confirmed by Court Carrier [phone]) on 05/27/2024 2:51:17 PM    ASSESSMENT AND PLAN:   Claudication in peripheral vascular disease (HCC) History of PAD status post PTA and  stenting of a right common iliac artery 03/28/2021 with an 8 mm x 29 mm VBX covered stent.  It did protrude several millimeters into the aorta.  Because of recurrent symptoms and a decline in his ABI I restudied him 05/22/2024 revealing an occluded stent which I restented with the same VBX stent.  He was discharged home the same day.  Apparently had difficulty with sheath pull and bleeding.  He has some mild ecchymosis but no hematoma.  He says his claudication is significantly improved.  Scheduled for Doppler studies next week.  CAD (coronary artery disease), with CABG [place] to LAD, Lt. radial artery to 1st diagnal, sequential SVG to PDA and PLA, 12/20/09 History of CAD status post posterolateral branch stenting by myself 02/21/2000.  I recatheterized him in 2011 revealing in-stent restenosis.  He had CABG times 12/20/2009 with LIMA to his LAD, left radial to diagonal branch, sequential vein to the PDA and PLA system.  I recatheterized him 10/02/2010 revealing occluded radial graft to the diagonal branch but otherwise unchanged anatomy.  He has been stable since and has had no chest pain until this morning when he developed 2 episodes of nitro responsive chest pain but none since.  He is been pain-free since the second episode.  His EKG shows no acute changes.  He does admit to having gone back to work Park and working harder than he should and is under a lot of stress.  I told him to take off the rest of the week.  At this point I do not feel compelled to perform stress testing unless he has recurrent episodes.     Maurice DOROTHA Lesches MD FACP,FACC,FAHA,  [facility] 05/27/2024 3:12 PM

## 2024-05-27 NOTE — Patient Instructions (Signed)
 Medication Instructions:  Your physician recommends that you continue on your current medications as directed. Please refer to the Current Medication list given to you today.  *If you need a refill on your cardiac medications before your next appointment, please call your pharmacy*   Follow-Up: At [facility], you and your health needs are our priority.  As part of our continuing mission to provide you with exceptional heart care, our providers are all part of one team.  This team includes your primary Cardiologist (physician) and Advanced Practice Providers or APPs (Physician Assistants and Nurse Practitioners) who all work together to provide you with the care you need, when you need it.  Your next appointment:   3 month(s)  Provider:   Dorn Lesches, MD

## 2024-05-27 NOTE — Progress Notes (Signed)
 EKG

## 2024-05-27 NOTE — Assessment & Plan Note (Signed)
 History of PAD status post PTA and stenting of a right common iliac artery 03/28/2021 with an 8 mm x 29 mm VBX covered stent.  It did protrude several millimeters into the aorta.  Because of recurrent symptoms and a decline in his ABI I restudied him 05/22/2024 revealing an occluded stent which I restented with the same VBX stent.  He was discharged home the same day.  Apparently had difficulty with sheath pull and bleeding.  He has some mild ecchymosis but no hematoma.  He says his claudication is significantly improved.  Scheduled for Doppler studies next week.

## 2024-05-27 NOTE — Progress Notes (Signed)
 Spoke with pt per Dr. Court for possible angina relieved by SL nitroglycerin . Pt would like to be seen today. Scheduled pt to come in later today.  Pt will take it easy until his appointment. ED precautions discussed with pt. Pt verbalized understanding.

## 2024-05-27 NOTE — Assessment & Plan Note (Signed)
 History of CAD status post posterolateral branch stenting by myself 02/21/2000.  I recatheterized him in 2011 revealing in-stent restenosis.  He had CABG times 12/20/2009 with LIMA to his LAD, left radial to diagonal branch, sequential vein to the PDA and PLA system.  I recatheterized him 10/02/2010 revealing occluded radial graft to the diagonal branch but otherwise unchanged anatomy.  He has been stable since and has had no chest pain until this morning when he developed 2 episodes of nitro responsive chest pain but none since.  He is been pain-free since the second episode.  His EKG shows no acute changes.  He does admit to having gone back to work early and working harder than he should and is under a lot of stress.  I told him to take off the rest of the week.  At this point I do not feel compelled to perform stress testing unless he has recurrent episodes.

## 2024-06-02 ENCOUNTER — Telehealth: Payer: Self-pay | Admitting: Cardiovascular Disease

## 2024-06-02 ENCOUNTER — Ambulatory Visit (HOSPITAL_COMMUNITY)
Admission: RE | Admit: 2024-06-02 | Discharge: 2024-06-02 | Disposition: A | Discharge status: Home / Self Care | Source: Ambulatory Visit | Attending: Cardiovascular Disease | Admitting: Cardiovascular Disease

## 2024-06-02 ENCOUNTER — Ambulatory Visit (HOSPITAL_BASED_OUTPATIENT_CLINIC_OR_DEPARTMENT_OTHER)
Admission: RE | Admit: 2024-06-02 | Discharge: 2024-06-02 | Disposition: A | Discharge status: Home / Self Care | Source: Ambulatory Visit | Attending: Cardiovascular Disease | Admitting: Cardiovascular Disease

## 2024-06-02 DIAGNOSIS — Z95820 Peripheral vascular angioplasty status with implants and grafts: Secondary | ICD-10-CM | POA: Diagnosis not present

## 2024-06-02 DIAGNOSIS — I739 Peripheral vascular disease, unspecified: Secondary | ICD-10-CM | POA: Diagnosis not present

## 2024-06-02 LAB — VAS US ABI WITH/WO TBI

## 2024-06-02 MED ORDER — HYDRALAZINE HCL 25 MG PO TABS: 25.0000 mg | ORAL_TABLET | Freq: Two times a day (BID) | ORAL | 3 refills | Status: AC

## 2024-06-02 NOTE — Telephone Encounter (Signed)
*  STAT* If patient is at the pharmacy, call can be transferred to refill team.   1. Which medications need to be refilled? (please list name of each medication and dose if known)   hydrALAZINE  (APRESOLINE ) 25 MG tablet   NEW PHARMACY    4. Which pharmacy/location (including street and city if local pharmacy) is medication to be sent to?  [id number] - [place], [place] - [address] [facility] [address] Phone: [phone]  Fax: [phone]       5. Do they need a 30 day or 90 day supply? 90

## 2024-06-02 NOTE — Telephone Encounter (Signed)
 Pt's medication was sent to pt's pharmacy as requested. Confirmation received.

## 2024-06-04 ENCOUNTER — Ambulatory Visit: Admitting: Cardiovascular Disease

## 2024-06-20 ENCOUNTER — Telehealth: Payer: Self-pay | Admitting: Cardiovascular Disease

## 2024-06-20 MED ORDER — ATORVASTATIN CALCIUM 80 MG PO TABS: 80.0000 mg | ORAL_TABLET | Freq: Every day | ORAL | 3 refills | Status: AC

## 2024-06-20 NOTE — Telephone Encounter (Signed)
 RX sent in

## 2024-06-20 NOTE — Telephone Encounter (Signed)
*  STAT* If patient is at the pharmacy, call can be transferred to refill team.   1. Which medications need to be refilled? (please list name of each medication and dose if known)   atorvastatin  (LIPITOR) 80 MG tablet     2. Would you like to learn more about the convenience, safety, & potential cost savings by using the [facility]? no    3. Are you open to using the [facility] (Type [facility].  ).no   4. Which pharmacy/location (including street and city if local pharmacy) is medication to be sent to? [id number] - [place], [place] - [address] [facility] [address]     5. Do they need a 30 day or 90 day supply? 30 day    Pt is out of medication

## 2024-06-26 MED FILL — Clopidogrel Bisulfate Tab 300 MG (Base Equiv): ORAL | Qty: 1 | Status: AC

## 2024-08-18 ENCOUNTER — Telehealth: Payer: Self-pay | Admitting: Cardiovascular Disease

## 2024-08-18 MED ORDER — CLOPIDOGREL BISULFATE 75 MG PO TABS: 75.0000 mg | ORAL_TABLET | Freq: Every day | ORAL | 3 refills | Status: AC

## 2024-08-18 NOTE — Telephone Encounter (Signed)
*  STAT* If patient is at the pharmacy, call can be transferred to refill team.   1. Which medications need to be refilled? (please list name of each medication and dose if known) clopidogrel  (PLAVIX ) 75 MG tablet    2. Would you like to learn more about the convenience, safety, & potential cost savings by using the [facility]?   3. Are you open to using the [facility] (Type [facility].  ).   4. Which pharmacy/location (including street and city if local pharmacy) is medication to be sent to? [id number] - [place], [place] - [address] [facility] [address]    5. Do they need a 30 day or 90 day supply? 90

## 2024-08-18 NOTE — Telephone Encounter (Signed)
 Refill sent

## 2024-08-26 ENCOUNTER — Encounter: Payer: Self-pay | Admitting: Cardiovascular Disease

## 2024-08-26 ENCOUNTER — Ambulatory Visit: Attending: Cardiovascular Disease | Admitting: Cardiovascular Disease

## 2024-08-26 VITALS — BP 130/70 | HR 59 | Ht 64.0 in | Wt 192.0 lb

## 2024-08-26 DIAGNOSIS — I6521 Occlusion and stenosis of right carotid artery: Secondary | ICD-10-CM

## 2024-08-26 DIAGNOSIS — I10 Essential (primary) hypertension: Secondary | ICD-10-CM

## 2024-08-26 DIAGNOSIS — I2510 Atherosclerotic heart disease of native coronary artery without angina pectoris: Secondary | ICD-10-CM

## 2024-08-26 DIAGNOSIS — I701 Atherosclerosis of renal artery: Secondary | ICD-10-CM

## 2024-08-26 DIAGNOSIS — I739 Peripheral vascular disease, unspecified: Secondary | ICD-10-CM

## 2024-08-26 DIAGNOSIS — E782 Mixed hyperlipidemia: Secondary | ICD-10-CM

## 2024-08-26 NOTE — Assessment & Plan Note (Signed)
 History of carotid artery disease status post endarterectomy of his right carotid by Dr. Oris in 2007.  Carotid Dopplers performed 02/07/2024 revealed this to be widely patent.

## 2024-08-26 NOTE — Progress Notes (Signed)
 08/26/2024 [name]   [birth year]  [id number]  Primary Physician Loreli Kins, MD Primary Cardiologist: [name], [place], [place]  HPI:  Maurice Park is a 68 y.o.  widowed (wife died from ALS 03/23/12) Filipino male, father of 3, son Maurice Park) who I last saw in the office 05/27/2024...   His son graduated from [place] and moved to [place]. [place]  where he is working in clinical cytogeneticist. He remarried to [name], who works as a public librarian at [employer], and who accompanies him today.  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  He has a history of CAD and PVOD. I stented his posterolateral branch back February 21, 2000. He had moderate LAD and circumflex disease at that time. He was recatheterization in 2011 revealing 60% in-stent restenosis of his PLA stent which I redilated. He also had right renal artery stenosis which I stented as well. Dr. Krystal Early performed elective right carotid endarterectomy on him in 2007 which we follow by duplex ultrasound. He developed crescendo angina and was catheterized by Dr. [name] December 24, 2009, revealing left main 3-vessel disease. He underwent coronary artery bypass grafting x4 December 20, 2009, with a LIMA to his LAD, a left radial to a diagonal branch, sequential vein to the PDA and PLA system. His postop course was complicated by prolonged paroxysmal atrial fibrillation which he recuperated from nicely. <BR><BR>He had a Myoview stress test performed March 22, 2010, which was nonischemic. Renal Dopplers continue to show widely patent right renal artery stent and carotid Dopplers show patent endarterectomy site. He denies chest pain or shortness of breath. He was catheterized October 02, 2010, revealing an occluded left radial graft to a diagonal branch, patent LIMA to the LAD and a patent sequential vein to the PDA and PLA with normal LV function and patent right renal artery  stent. His last lipid profile was a year ago. Since I saw him 18 months ago he's remained completely stable. He is working out with a psychologist, educational 2 days a week. His son Maurice Park is now 7 years old and has gotten into [employer]his wife of 25 years, Maurice Park, died Jul 16, 2013of ALS. He remarried to his current wife [name] on 01/29/16.       He is very active and plays tennis for long periods of time without symptoms.  His lipid profile and hemoglobin A1c are in the therapeutic range.  Recent carotid and renal Dopplers were normal.  He does complain of some right calf claudication however which is new over the last 6 to 8 months.  I obtain lower extremity arterial Dopplers on him 02/17/2021 revealed a right ABI of 0.92 and a left of 1.18.  He did have a high-frequency signal in his right iliac artery suggesting high-grade disease probably contributing to his lifestyle of any claudication.  I performed peripheral angiography, orbital atherectomy and VBX covered stenting of a high-grade calcified proximal right common iliac artery stenosis with an excellent result.  He was discharged home the following day.  His Dopplers performed 04/04/2021 showed normalization of an 8 of his ABI and his velocities.  His hip pain and claudication have resolved.  He is back playing tennis without limitation.   He apparently fell while playing tennis 10/24/2023 and hit his head.  He did have a injury cranial bleed and was followed by Dr.  Alm Molt.  He was out of work for a month and has just gone back to work recently.  He did have some visual impairment initially.   He has been complaining of right lower extremity claudication.  He is undergoing physical therapy and lithotripsy therapy with potential tear of some ligaments although his most recent Doppler studies reveal a decline in his right ABI from 1.11 down to 0.66 suggesting either restenosis or Denovo lesion in the right iliac system.  He wishes to proceed with  outpatient angiography and endovascular therapy for lifestyle-limiting claudication.   I performed outpatient peripheral angiography on him 05/22/2024 revealing occluded right common neck artery stent which is I was able to cross and restent with a 8 mm x 29 mm long VBX stent.  He was discharged home the following day.  He had some issues with sheath pull and bleeding which ultimately was controlled.  He says his claudication is improved.  He developed 2 episodes of nitro responsive chest pain this morning but none since that time.  He does admit to going back to work early and worked harder than he should have.  Since I saw him 3 months ago he denies chest pain, shortness of breath or claudication.  He still works long hours however.  He wants to get back to the gym and start exercising as well as lose weight.   Active Medications[1]   Allergies[2]  Social History   Socioeconomic History   Marital status: Media Planner    Spouse name: Not on file   Number of children: 1   Years of education: BSBA   Highest education level: Not on file  Occupational History   Occupation: Sr Nutritional Therapist  Tobacco Use   Smoking status: Former    Current packs/day: 0.00    Average packs/day: 1 pack/day for 12.0 years (12.0 ttl pk-yrs)    Types: Cigarettes    Start date: 11/09/1969    Quit date: 11/09/1981    Years since quitting: 42.8   Smokeless tobacco: Never  Substance and Sexual Activity   Alcohol use: Yes    Alcohol/week: 3.0 standard drinks of alcohol    Types: 3 Cans of beer per week    Comment: last drink was in november   Drug use: No   Sexual activity: Not Currently    Comment: Right Carotid endarectomy  Other Topics Concern   Not on file  Social History Narrative   Not on file   Social Drivers of Health   Tobacco Use: Medium Risk (05/27/2024)   Patient History    Smoking Tobacco Use: Former    Smokeless Tobacco Use: Never    Passive Exposure: Not on Actuary  Strain: Not on file  Food Insecurity: No Food Insecurity (05/22/2024)   Epic    Worried About Programme Researcher, Broadcasting/film/video in the Last Year: Never true    Ran Out of Food in the Last Year: Never true  Transportation Needs: No Transportation Needs (05/22/2024)   Epic    Lack of Transportation (Medical): No    Lack of Transportation (Non-Medical): No  Physical Activity: Not on file  Stress: Not on file  Social Connections: Socially Integrated (05/22/2024)   Social Connection and Isolation Panel    Frequency of Communication with Friends and Family: More than three times a week    Frequency of Social Gatherings with Friends and Family: More than three times a week    Attends Religious Services: More than  4 times per year    Active Member of Clubs or Organizations: Yes    Attends Banker Meetings: More than 4 times per year    Marital Status: Married  Catering Manager Violence: Not At Risk (05/22/2024)   Epic    Fear of Current or Ex-Partner: No    Emotionally Abused: No    Physically Abused: No    Sexually Abused: No  Depression (PHQ2-9): Not on file  Alcohol Screen: Not on file  Housing: Low Risk (05/22/2024)   Epic    Unable to Pay for Housing in the Last Year: No    Number of Times Moved in the Last Year: 0    Homeless in the Last Year: No  Utilities: Not At Risk (05/22/2024)   Epic    Threatened with loss of utilities: No  Health Literacy: Not on file     Review of Systems: General: negative for chills, fever, night sweats or weight changes.  Cardiovascular: negative for chest pain, dyspnea on exertion, edema, orthopnea, palpitations, paroxysmal nocturnal dyspnea or shortness of breath Dermatological: negative for rash Respiratory: negative for cough or wheezing Urologic: negative for hematuria Abdominal: negative for nausea, vomiting, diarrhea, bright red blood per rectum, melena, or hematemesis Neurologic: negative for visual changes, syncope, or dizziness All other  systems reviewed and are otherwise negative except as noted above.    Blood pressure 130/70, pulse (!) 59, height 5' 4 (1.626 m), weight 192 lb (87.1 kg), SpO2 96%.  General appearance: alert and no distress Neck: no adenopathy, no carotid bruit, no JVD, supple, symmetrical, trachea midline, and thyroid not enlarged, symmetric, no tenderness/mass/nodules Lungs: clear to auscultation bilaterally Heart: regular rate and rhythm, S1, S2 normal, no murmur, click, rub or gallop Extremities: extremities normal, atraumatic, no cyanosis or edema Pulses: 2+ and symmetric Skin: Skin color, texture, turgor normal. No rashes or lesions Neurologic: Grossly normal  EKG not performed today      ASSESSMENT AND PLAN:   CAD (coronary artery disease), with CABG [place] to LAD, Lt. radial artery to 1st diagnal, sequential SVG to PDA and PLA, 12/20/09 History of CAD status post posterolateral branch stenting by myself 02/21/2000.  He had moderate LAD and circumflex disease.  He was recatheterized in 2000 Levan revealing 60% in-stent restenosis of his PLA stent which I redilated.  He developed crescendo angina and was catheterized by Dr. Charlena Kelly/15/2000 [place] revealing left main/three-vessel disease underwent CABG times 12/21/1998 Levan with LIMA to his LAD, left radial to diagonal branch and sequential vein to the PDA and PLA.  His postop course was unremarkable.  I recatheterized him 10/02/2010 revealing an occluded left radial graft to the diagonal branch patent LIMA to the LAD and patent sequential vein to the PDA PLA with normal LV function.  He has remained asymptomatic from cardiovascular point of view since that time.  Hyperlipidemia, controlled History of hyperlipidemia on high-dose statin therapy and Zetia  with lipid profile performed 05/23/2024 revealing total cholesterol 129, LDL 69 HDL 43.  Renal artery stenosis History of renal artery stenosis status post renal artery stenting by myself which has  remained patent by renal Doppler studies  Carotid artery disease History of carotid artery disease status post endarterectomy of his right carotid by Dr. Oris in 2007.  Carotid Dopplers performed 02/07/2024 revealed this to be widely patent.  Essential hypertension History of essential hypertension with blood pressure measured today at 130/70.  He is on hydralazine , metoprolol  and ramipril .  Claudication in peripheral vascular  disease History of PAD status post right common iliac artery stenting 03/28/2021 with an 8 mm x 29 mm VBX covered stent.  Because of recurrent claudication and a decline in his ABI I restudied him 05/22/2024 revealing an occluded VBX stent which I restented with an 8 mm x 29 mm VBX stent.  His follow-up Doppler studies performed 06/02/2024 revealed this to be widely patent and his claudication has resolved.  He remains on DAPT.     Dorn DOROTHA Lesches MD FACP,FACC,FAHA, FSCAI 08/26/2024 8:33 AM    [1]  Current Meds  Medication Sig   acetaminophen  (TYLENOL ) 500 MG tablet Take 1,000 mg by mouth every 8 (eight) hours as needed for mild pain (pain score 1-3) or moderate pain (pain score 4-6).   aspirin  EC 81 MG tablet Take 81 mg by mouth at bedtime.   atorvastatin  (LIPITOR) 80 MG tablet Take 1 tablet (80 mg total) by mouth daily.   clopidogrel  (PLAVIX ) 75 MG tablet Take 1 tablet (75 mg total) by mouth daily with breakfast.   ezetimibe  (ZETIA ) 10 MG tablet Take 1 tablet (10 mg total) by mouth daily.   gabapentin (NEURONTIN) 100 MG capsule Take 100 mg by mouth daily as needed (Frozen shoulder).   hydrALAZINE  (APRESOLINE ) 25 MG tablet Take 1 tablet (25 mg total) by mouth in the morning and at bedtime.   metoprolol  succinate (TOPROL -XL) 50 MG 24 hr tablet TAKE 1 TABLET BY MOUTH EVERY DAY WITH OR IMMEDIATELY FOLLOWING A MEAL   Multiple Vitamin (MULTIVITAMIN) capsule Take 1 capsule by mouth daily.   nitroGLYCERIN  (NITROSTAT ) 0.4 MG SL tablet Place 1 tablet (0.4 mg total) under  the tongue every 5 (five) minutes as needed for up to 25 days for chest pain.   ramipril  (ALTACE ) 5 MG capsule TAKE 1 CAPSULE BY MOUTH EVERY DAY   ranolazine  (RANEXA ) 500 MG 12 hr tablet Take 1 tablet (500 mg total) by mouth 2 (two) times daily.   sildenafil  (VIAGRA ) 100 MG tablet Take 1 tablet (100 mg total) by mouth daily as needed for erectile dysfunction.  [2]  Allergies Allergen Reactions   Hydrocortisone Hives   Latex Other (See Comments)    Unknown   Other Swelling    Any topical bacitracin cream   Codeine Nausea Only   Contrast Media [Iodinated Contrast Media] Hives   Neosporin [Neomycin-Polymyxin-Gramicidin] Swelling

## 2024-08-26 NOTE — Assessment & Plan Note (Signed)
 History of renal artery stenosis status post renal artery stenting by myself which has remained patent by renal Doppler studies

## 2024-08-26 NOTE — Assessment & Plan Note (Signed)
 History of CAD status post posterolateral branch stenting by myself 02/21/2000.  He had moderate LAD and circumflex disease.  He was recatheterized in 2000 Levan revealing 60% in-stent restenosis of his PLA stent which I redilated.  He developed crescendo angina and was catheterized by Dr. Charlena Kelly/15/2000 [place] revealing left main/three-vessel disease underwent CABG times 12/21/1998 Levan with LIMA to his LAD, left radial to diagonal branch and sequential vein to the PDA and PLA.  His postop course was unremarkable.  I recatheterized him 10/02/2010 revealing an occluded left radial graft to the diagonal branch patent LIMA to the LAD and patent sequential vein to the PDA PLA with normal LV function.  He has remained asymptomatic from cardiovascular point of view since that time.

## 2024-08-26 NOTE — Assessment & Plan Note (Signed)
 History of PAD status post right common iliac artery stenting 03/28/2021 with an 8 mm x 29 mm VBX covered stent.  Because of recurrent claudication and a decline in his ABI I restudied him 05/22/2024 revealing an occluded VBX stent which I restented with an 8 mm x 29 mm VBX stent.  His follow-up Doppler studies performed 06/02/2024 revealed this to be widely patent and his claudication has resolved.  He remains on DAPT.

## 2024-08-26 NOTE — Patient Instructions (Signed)

## 2024-08-26 NOTE — Assessment & Plan Note (Signed)
 History of hyperlipidemia on high-dose statin therapy and Zetia  with lipid profile performed 05/23/2024 revealing total cholesterol 129, LDL 69 HDL 43.

## 2024-08-26 NOTE — Assessment & Plan Note (Signed)
 History of essential hypertension with blood pressure measured today at 130/70.  He is on hydralazine , metoprolol  and ramipril .

## 2024-09-08 ENCOUNTER — Other Ambulatory Visit (HOSPITAL_COMMUNITY): Payer: Self-pay

## 2024-10-10 ENCOUNTER — Telehealth: Payer: Self-pay | Admitting: Cardiovascular Disease

## 2024-10-10 NOTE — Telephone Encounter (Signed)
" °*  STAT* If patient is at the pharmacy, call can be transferred to refill team.   1. Which medications need to be refilled? (please list name of each medication and dose if known)   ramipril  (ALTACE ) 5 MG capsule   2. Would you like to learn more about the convenience, safety, & potential cost savings by using the [facility]?   3. Are you open to using the [facility] (Type [facility]. ).   4. Which pharmacy/location (including street and city if local pharmacy) is medication to be sent to?  [id number] - [place], [place] - [address]   5. Do they need a 30 day or 90 day supply?   90 day  Patient stated he has 5 capsules left.    "

## 2024-10-13 MED ORDER — RAMIPRIL 5 MG PO CAPS: 5.0000 mg | ORAL_CAPSULE | Freq: Every day | ORAL | 3 refills | Status: AC

## 2024-10-13 NOTE — Telephone Encounter (Signed)
 Refill sent

## 2024-11-14 ENCOUNTER — Ambulatory Visit (HOSPITAL_COMMUNITY)

## 2025-02-06 ENCOUNTER — Ambulatory Visit (HOSPITAL_COMMUNITY)
